# Patient Record
Sex: Male | Born: 1955
Health system: Southern US, Community
[De-identification: ages and names within clinical notes are randomized; demographics above are authoritative.]

## PROBLEM LIST (undated history)

## (undated) DIAGNOSIS — N400 Enlarged prostate without lower urinary tract symptoms: Secondary | ICD-10-CM

## (undated) DIAGNOSIS — R739 Hyperglycemia, unspecified: Secondary | ICD-10-CM

## (undated) DIAGNOSIS — I10 Essential (primary) hypertension: Secondary | ICD-10-CM

## (undated) DIAGNOSIS — N529 Male erectile dysfunction, unspecified: Secondary | ICD-10-CM

## (undated) DIAGNOSIS — E119 Type 2 diabetes mellitus without complications: Secondary | ICD-10-CM

## (undated) DIAGNOSIS — R7401 Elevation of levels of liver transaminase levels: Secondary | ICD-10-CM

## (undated) DIAGNOSIS — R740 Nonspecific elevation of levels of transaminase and lactic acid dehydrogenase [LDH]: Secondary | ICD-10-CM

## (undated) HISTORY — DX: Nonspecific elevation of levels of transaminase and lactic acid dehydrogenase (ldh): R74.0

## (undated) HISTORY — PX: COLONOSCOPY: SHX174

## (undated) HISTORY — DX: Essential (primary) hypertension: I10

## (undated) HISTORY — DX: Elevation of levels of liver transaminase levels: R74.01

## (undated) HISTORY — DX: Benign prostatic hyperplasia without lower urinary tract symptoms: N40.0

## (undated) HISTORY — DX: Hyperglycemia, unspecified: R73.9

## (undated) HISTORY — DX: Male erectile dysfunction, unspecified: N52.9

---

## 2001-07-13 HISTORY — PX: PROSTATE SURGERY: SHX751

## 2005-11-05 ENCOUNTER — Ambulatory Visit: Payer: Self-pay | Admitting: Family Medicine

## 2005-11-10 ENCOUNTER — Encounter: Payer: Self-pay | Admitting: Family Medicine

## 2005-11-10 HISTORY — PX: OTHER SURGICAL HISTORY: SHX169

## 2005-11-10 LAB — CONVERTED CEMR LAB

## 2005-12-15 ENCOUNTER — Ambulatory Visit: Payer: Self-pay | Admitting: Family Medicine

## 2005-12-29 ENCOUNTER — Ambulatory Visit: Payer: Self-pay | Admitting: Family Medicine

## 2006-03-01 ENCOUNTER — Ambulatory Visit: Payer: Self-pay | Admitting: Family Medicine

## 2006-03-31 ENCOUNTER — Ambulatory Visit: Payer: Self-pay | Admitting: Family Medicine

## 2006-04-20 ENCOUNTER — Ambulatory Visit: Payer: Self-pay | Admitting: Family Medicine

## 2006-07-19 ENCOUNTER — Ambulatory Visit: Payer: Self-pay | Admitting: Family Medicine

## 2006-07-19 LAB — CONVERTED CEMR LAB: Blood Glucose, Fasting: 197 mg/dL

## 2006-08-04 ENCOUNTER — Ambulatory Visit: Payer: Self-pay | Admitting: Family Medicine

## 2006-08-04 LAB — CONVERTED CEMR LAB
Cholesterol: 160 mg/dL (ref 0–200)
HDL: 35 mg/dL — ABNORMAL LOW (ref >39.0)
Hgb A1c MFr Bld: 5.7 %
Hgb A1c MFr Bld: 5.7 % (ref 4.6–6.0)
LDL Cholesterol: 86 mg/dL (ref 0–99)
Total CHOL/HDL Ratio: 4.6
Triglycerides: 197 mg/dL — ABNORMAL HIGH (ref 0–149)
VLDL: 39 mg/dL (ref 0–40)

## 2006-09-15 ENCOUNTER — Ambulatory Visit: Payer: Self-pay | Admitting: Family Medicine

## 2006-10-07 ENCOUNTER — Encounter: Payer: Self-pay | Admitting: Family Medicine

## 2006-10-07 DIAGNOSIS — F528 Other sexual dysfunction not due to a substance or known physiological condition: Secondary | ICD-10-CM | POA: Insufficient documentation

## 2006-10-07 DIAGNOSIS — R972 Elevated prostate specific antigen [PSA]: Secondary | ICD-10-CM | POA: Insufficient documentation

## 2006-10-07 DIAGNOSIS — N400 Enlarged prostate without lower urinary tract symptoms: Secondary | ICD-10-CM | POA: Insufficient documentation

## 2007-03-18 ENCOUNTER — Ambulatory Visit: Payer: Self-pay | Admitting: Family Medicine

## 2007-03-18 DIAGNOSIS — E786 Lipoprotein deficiency: Secondary | ICD-10-CM | POA: Insufficient documentation

## 2007-03-21 LAB — CONVERTED CEMR LAB
Albumin: 4.2 g/dL (ref 3.5–5.2)
BUN: 18 mg/dL (ref 6–23)
CO2: 28 meq/L (ref 19–32)
Calcium: 9.8 mg/dL (ref 8.4–10.5)
Chloride: 105 meq/L (ref 96–112)
Creatinine, Ser: 1 mg/dL (ref 0.4–1.5)
GFR calc Af Amer: 101 mL/min
GFR calc non Af Amer: 83 mL/min
Glucose, Bld: 109 mg/dL — ABNORMAL HIGH (ref 70–99)
Hgb A1c MFr Bld: 5.7 % (ref 4.6–6.0)
Phosphorus: 3.7 mg/dL (ref 2.3–4.6)
Potassium: 4.2 meq/L (ref 3.5–5.1)
Sodium: 140 meq/L (ref 135–145)

## 2007-09-16 ENCOUNTER — Ambulatory Visit: Payer: Self-pay | Admitting: Family Medicine

## 2008-03-27 ENCOUNTER — Ambulatory Visit: Payer: Self-pay | Admitting: Family Medicine

## 2008-04-02 LAB — CONVERTED CEMR LAB
ALT: 66 units/L — ABNORMAL HIGH (ref 0–53)
Albumin: 4.3 g/dL (ref 3.5–5.2)
BUN: 16 mg/dL (ref 6–23)
Basophils Absolute: 0 10*3/uL (ref 0.0–0.1)
Basophils Relative: 0.4 % (ref 0.0–3.0)
CO2: 30 meq/L (ref 19–32)
Calcium: 9.3 mg/dL (ref 8.4–10.5)
Cholesterol: 143 mg/dL (ref 0–200)
Creatinine, Ser: 1 mg/dL (ref 0.4–1.5)
Glucose, Bld: 108 mg/dL — ABNORMAL HIGH (ref 70–99)
Hemoglobin: 14.8 g/dL (ref 13.0–17.0)
Hgb A1c MFr Bld: 6.1 % — ABNORMAL HIGH (ref 4.6–6.0)
LDL Cholesterol: 85 mg/dL (ref 0–99)
Lymphocytes Relative: 33.2 % (ref 12.0–46.0)
MCHC: 34.2 g/dL (ref 30.0–36.0)
Neutro Abs: 2.5 10*3/uL (ref 1.4–7.7)
RBC: 4.74 M/uL (ref 4.22–5.81)
TSH: 1.53 microintl units/mL (ref 0.35–5.50)
Total Bilirubin: 2.1 mg/dL — ABNORMAL HIGH (ref 0.3–1.2)
Total Protein: 7.1 g/dL (ref 6.0–8.3)

## 2008-04-16 ENCOUNTER — Encounter: Payer: Self-pay | Admitting: Family Medicine

## 2008-04-16 ENCOUNTER — Ambulatory Visit: Payer: Self-pay | Admitting: Family Medicine

## 2008-04-16 DIAGNOSIS — R7401 Elevation of levels of liver transaminase levels: Secondary | ICD-10-CM | POA: Insufficient documentation

## 2008-04-16 DIAGNOSIS — E119 Type 2 diabetes mellitus without complications: Secondary | ICD-10-CM | POA: Insufficient documentation

## 2008-04-16 DIAGNOSIS — R740 Nonspecific elevation of levels of transaminase and lactic acid dehydrogenase [LDH]: Secondary | ICD-10-CM

## 2008-04-16 DIAGNOSIS — E1129 Type 2 diabetes mellitus with other diabetic kidney complication: Secondary | ICD-10-CM | POA: Insufficient documentation

## 2008-04-16 HISTORY — DX: Type 2 diabetes mellitus without complications: E11.9

## 2008-07-19 ENCOUNTER — Ambulatory Visit: Payer: Self-pay | Admitting: Family Medicine

## 2008-07-19 DIAGNOSIS — E785 Hyperlipidemia, unspecified: Secondary | ICD-10-CM

## 2008-07-19 DIAGNOSIS — E1169 Type 2 diabetes mellitus with other specified complication: Secondary | ICD-10-CM | POA: Insufficient documentation

## 2008-07-20 LAB — CONVERTED CEMR LAB
ALT: 28 units/L (ref 0–53)
CO2: 27 meq/L (ref 19–32)
Chloride: 106 meq/L (ref 96–112)
GFR calc Af Amer: 100 mL/min
GFR calc non Af Amer: 83 mL/min
Glucose, Bld: 106 mg/dL — ABNORMAL HIGH (ref 70–99)
Hgb A1c MFr Bld: 5.6 % (ref 4.6–6.0)
Potassium: 4 meq/L (ref 3.5–5.1)
Sodium: 140 meq/L (ref 135–145)

## 2008-07-24 ENCOUNTER — Ambulatory Visit: Payer: Self-pay | Admitting: Family Medicine

## 2008-09-24 ENCOUNTER — Telehealth: Payer: Self-pay | Admitting: Family Medicine

## 2009-01-24 ENCOUNTER — Ambulatory Visit: Payer: Self-pay | Admitting: Family Medicine

## 2009-01-25 LAB — CONVERTED CEMR LAB
ALT: 35 units/L (ref 0–53)
AST: 28 units/L (ref 0–37)
Cholesterol: 155 mg/dL (ref 0–200)
Hgb A1c MFr Bld: 6 % (ref 4.6–6.5)

## 2009-01-29 ENCOUNTER — Ambulatory Visit: Payer: Self-pay | Admitting: Family Medicine

## 2009-03-06 ENCOUNTER — Ambulatory Visit: Payer: Self-pay | Admitting: Family Medicine

## 2009-04-25 ENCOUNTER — Ambulatory Visit: Payer: Self-pay | Admitting: Family Medicine

## 2009-04-25 DIAGNOSIS — I10 Essential (primary) hypertension: Secondary | ICD-10-CM | POA: Insufficient documentation

## 2009-04-29 LAB — CONVERTED CEMR LAB
AST: 34 units/L (ref 0–37)
BUN: 21 mg/dL (ref 6–23)
CO2: 28 meq/L (ref 19–32)
Chloride: 103 meq/L (ref 96–112)
Cholesterol: 144 mg/dL (ref 0–200)
Creatinine, Ser: 0.9 mg/dL (ref 0.4–1.5)
GFR calc non Af Amer: 93.1 mL/min (ref >60)
Hgb A1c MFr Bld: 6 % (ref 4.6–6.5)
Phosphorus: 3.7 mg/dL (ref 2.3–4.6)
Sodium: 139 meq/L (ref 135–145)
Total CHOL/HDL Ratio: 5
Triglycerides: 95 mg/dL (ref 0.0–149.0)

## 2009-05-02 ENCOUNTER — Ambulatory Visit: Payer: Self-pay | Admitting: Family Medicine

## 2009-05-16 ENCOUNTER — Telehealth: Payer: Self-pay | Admitting: Family Medicine

## 2009-10-21 ENCOUNTER — Ambulatory Visit: Payer: Self-pay | Admitting: Family Medicine

## 2009-10-21 LAB — CONVERTED CEMR LAB
ALT: 53 units/L (ref 0–53)
AST: 30 units/L (ref 0–37)
Albumin: 4.2 g/dL (ref 3.5–5.2)
Chloride: 104 meq/L (ref 96–112)
Cholesterol: 148 mg/dL (ref 0–200)
GFR calc non Af Amer: 92.94 mL/min (ref >60)
Glucose, Bld: 114 mg/dL — ABNORMAL HIGH (ref 70–99)
Hgb A1c MFr Bld: 6.1 % (ref 4.6–6.5)
Phosphorus: 3.4 mg/dL (ref 2.3–4.6)
Potassium: 3.8 meq/L (ref 3.5–5.1)
Sodium: 141 meq/L (ref 135–145)
VLDL: 35.4 mg/dL (ref 0.0–40.0)

## 2009-10-24 ENCOUNTER — Ambulatory Visit: Payer: Self-pay | Admitting: Family Medicine

## 2010-06-10 ENCOUNTER — Encounter: Payer: Self-pay | Admitting: Family Medicine

## 2010-06-23 ENCOUNTER — Ambulatory Visit: Payer: Self-pay | Admitting: Family Medicine

## 2010-06-26 ENCOUNTER — Ambulatory Visit: Payer: Self-pay | Admitting: Family Medicine

## 2010-06-26 LAB — CONVERTED CEMR LAB
Albumin: 4 g/dL (ref 3.5–5.2)
Alkaline Phosphatase: 79 units/L (ref 39–117)
BUN: 17 mg/dL (ref 6–23)
Basophils Absolute: 0 10*3/uL (ref 0.0–0.1)
Basophils Relative: 0.2 % (ref 0.0–3.0)
Bilirubin, Direct: 0.2 mg/dL (ref 0.0–0.3)
CO2: 27 meq/L (ref 19–32)
Calcium: 9.4 mg/dL (ref 8.4–10.5)
Chloride: 104 meq/L (ref 96–112)
Cholesterol: 158 mg/dL (ref 0–200)
Creatinine, Ser: 0.9 mg/dL (ref 0.4–1.5)
Creatinine,U: 102.7 mg/dL
Eosinophils Absolute: 0.1 10*3/uL (ref 0.0–0.7)
Glucose, Bld: 125 mg/dL — ABNORMAL HIGH (ref 70–99)
HDL: 33.3 mg/dL — ABNORMAL LOW (ref >39.00)
Hgb A1c MFr Bld: 6.8 % — ABNORMAL HIGH (ref 4.6–6.5)
Lymphocytes Relative: 32 % (ref 12.0–46.0)
MCHC: 34.5 g/dL (ref 30.0–36.0)
MCV: 89.6 fL (ref 78.0–100.0)
Microalb, Ur: 1 mg/dL (ref 0.0–1.9)
Monocytes Absolute: 0.5 10*3/uL (ref 0.1–1.0)
Neutrophils Relative %: 57.3 % (ref 43.0–77.0)
RBC: 4.61 M/uL (ref 4.22–5.81)
RDW: 13.6 % (ref 11.5–14.6)
Total Protein: 6.7 g/dL (ref 6.0–8.3)
Triglycerides: 106 mg/dL (ref 0.0–149.0)

## 2010-07-18 ENCOUNTER — Encounter: Payer: Self-pay | Admitting: Family Medicine

## 2010-07-18 ENCOUNTER — Ambulatory Visit: Payer: Self-pay | Admitting: Family Medicine

## 2010-07-23 ENCOUNTER — Telehealth: Payer: Self-pay | Admitting: Family Medicine

## 2010-08-12 NOTE — Miscellaneous (Signed)
Summary: flu vaccine  Clinical Lists Changes  Observations: Added new observation of FLU VAX: Historical received at [facility] (04/18/2010 11:43)      Immunization History:  Influenza Immunization History:    Influenza:  historical received at [facility] (04/18/2010)

## 2010-08-12 NOTE — Assessment & Plan Note (Signed)
Summary: 3 MONTH FOLLOW UP/[facility]   Vital Signs:  Patient Profile:   55 Years Old Male Height:     69 inches (175.26 cm) Weight:      230 pounds BMI:     34.09 Temp:     97.7 degrees F oral Pulse rate:   72 / minute Pulse rhythm:   regular BP sitting:   120 / 84  (left arm) Cuff size:   large  Vitals Entered By: Liane Comber (July 24, 2008 7:57 AM)                 Chief Complaint:  3 mo f/u.  History of Present Illness: wt is down 20 lb today has watched his diet- staying away from sugar and juices is using treadmill for 1 hour per day -- over 3 mi per day   his goal is to loose to 180s   bp is great too  feels good/overall better   chol - LDL 83, HDL 33 AIC is 5.6-- improved from last time  other labs are ok   forgets his fish oil eats dark chocolate        Current Allergies (reviewed today): No known allergies   Past Medical History:    Reviewed history from 04/16/2008 and no changes required:       Hypertension       BPH       ED       hyperglycemia        elevated transaminases              urol Dr Reuel Boom   Past Surgical History:    Reviewed history from 10/07/2006 and no changes required:       US-  Abdominal , negaive (11/2005)       Prostate biopsy - neg. ( urologist (2003)   Family History:    father- prostate ca, DM     mother HTN , and high chol     brother DM - heart and kidney dz     uncle with prostate ca   Social History:    Reviewed history from 03/27/2008 and no changes required:       Patient is a former smoker.        walks 4 mi day for exercise       very rare alcoholic beverages     Review of Systems  General      Denies fatigue, loss of appetite, and malaise.  Eyes      Denies blurring and eye pain.  CV      Denies chest pain or discomfort, lightheadness, palpitations, and shortness of breath with exertion.  Resp      Denies cough and wheezing.  GI      Denies abdominal pain and change in bowel  habits.  Derm      Denies rash.  Neuro      Denies numbness and tingling.  Psych      mood is good   Endo      Denies excessive thirst and excessive urination.   Physical Exam  General:     overweight but generally well appearing- with sig wt loss since last visit   Head:     normocephalic, atraumatic, and no abnormalities observed.   Eyes:     vision grossly intact, pupils equal, pupils round, and pupils reactive to light.   Neck:     supple with full rom and no masses or thyromegally,  no JVD or carotid bruit  Lungs:     Normal respiratory effort, chest expands symmetrically. Lungs are clear to auscultation, no crackles or wheezes. Heart:     Normal rate and regular rhythm. S1 and S2 normal without gallop, murmur, click, rub or other extra sounds. Msk:     No deformity or scoliosis noted of thoracic or lumbar spine.   Extremities:     No clubbing, cyanosis, edema, or deformity noted with normal full range of motion of all joints.   foot exam not done today Neurologic:     sensation intact to light touch, gait normal, and DTRs symmetrical and normal.   Skin:     Intact without suspicious lesions or rashes Cervical Nodes:     No lymphadenopathy noted Psych:     normal affect, talkative and pleasant     Impression & Recommendations:  Problem # 1:  PURE HYPERCHOLESTEROLEMIA (ICD-272.0) Assessment: Unchanged overall very well controlled- but would like HDL higher adv to start taking fish oil / omega 3 regularly if not imp in future/ or worse- would consider niaspan labd 6 mo and f/u adv to keep up great diet and exercise Labs Reviewed: Chol: 136 (07/19/2008)   HDL: 33.1 (07/19/2008)   LDL: 83 (07/19/2008)   TG: 100 (07/19/2008) SGOT: 24 (07/19/2008)   SGPT: 28 (07/19/2008)   Problem # 2:  OBESITY, UNSPECIFIED (ICD-278.00) Assessment: Improved 20 lb wt loss commended urged to keep it up  aim for BMI of 27 as next goal  Problem # 3:  DIABETES MELLITUS,  TYPE II, MILD (ICD-250.00) Assessment: Improved AIC is now under 6 commended for good work with that  will continue working on wt loss labs 6 mo then f/u His updated medication list for this problem includes:    Benicar Hct 40-12.5 Mg Tabs (Olmesartan medoxomil-hctz) .Marland Kitchen... Take 1 tablet by mouth once a day    Adult Aspirin Ec Low Strength 81 Mg Tbec (Aspirin) .Marland Kitchen... As needed  Labs Reviewed: HgBA1c: 5.6 (07/19/2008)   Creat: 1.0 (07/19/2008)      Problem # 4:  HYPERTENSION (ICD-401.9) Assessment: Improved better control with lifestyle change/wt loss continue benicar labs and f/u in 6 mo His updated medication list for this problem includes:    Benicar Hct 40-12.5 Mg Tabs (Olmesartan medoxomil-hctz) .Marland Kitchen... Take 1 tablet by mouth once a day  BP today: 120/84 Prior BP: 160/88 (04/16/2008)  Labs Reviewed: Creat: 1.0 (07/19/2008) Chol: 136 (07/19/2008)   HDL: 33.1 (07/19/2008)   LDL: 83 (07/19/2008)   TG: 100 (07/19/2008)   Complete Medication List: 1)  Benicar Hct 40-12.5 Mg Tabs (Olmesartan medoxomil-hctz) .... Take 1 tablet by mouth once a day 2)  Avodart 0.5 Mg Caps (Dutasteride) .Marland Kitchen.. 1 cap weekly 3)  Adult Aspirin Ec Low Strength 81 Mg Tbec (Aspirin) .... As needed   Patient Instructions: 1)  no change in medication 2)  keep up the good work with diet and exercise  3)  try to take fish oil regularly up to three 1000 mg capsules daily  4)  schedule fasting labs in 6 months- lipid/ast/alt/AIC 250.0, 272 and then follow up

## 2010-08-12 NOTE — Assessment & Plan Note (Signed)
Summary: 6 MTH FUR/BH  R/S FROM 10/28/09   Vital Signs:  Patient profile:   55 year old male Height:      69 inches Weight:      248.25 pounds BMI:     36.79 Temp:     98 degrees F oral Pulse rate:   76 / minute Pulse rhythm:   regular BP sitting:   128 / 84  (left arm) Cuff size:   large  Vitals Entered By: Lewanda Rife LPN (October 24, 2009 8:23 AM) CC: six month f/u after labs   History of Present Illness: here for f/u of DM and HTN and lipids  has been feeling good    lipids are imp with HDL up to 38 and LDL 75 diet is overall pretty good - but eats too late at night  eats the right things -- salads for lunch  no fast food    AIC is up to 6.1 from 6-- overall stable opthy --was due in december - got too busy -- needs to schedule that  some sweets occas   bp 128/84- on hyzaar   wt is down 1 lb - really wants to loose more really busy at work -- is new so traveling a lot -- is settling down  no time to exercise thinks he needs to get up earlier to exercise   was walking 2 mi per day for a while and felt good with that  has an elliptical machine   Allergies (verified): No Known Drug Allergies  Past History:  Past Medical History: Last updated: 04/16/2008 Hypertension BPH ED hyperglycemia  elevated transaminases  urol Dr Reuel Boom   Past Surgical History: Last updated: 10/07/2006 US-  Abdominal , negaive (11/2005) Prostate biopsy - neg. ( urologist (2003)  Family History: Last updated: 01/29/2009 father- prostate ca, DM , died of stroke (hemorrhagic)  mother HTN , and high chol  brother DM - heart and kidney dz  uncle with prostate ca   Social History: Last updated: 03/27/2008 Patient is a former smoker.  walks 4 mi day for exercise very rare alcoholic beverages   Risk Factors: Smoking Status: quit (10/07/2006)  Review of Systems General:  Denies fatigue, fever, loss of appetite, and malaise. Eyes:  Denies blurring and eye irritation. CV:   Denies chest pain or discomfort, lightheadness, palpitations, and shortness of breath with exertion. Resp:  Denies cough and wheezing. GI:  Denies abdominal pain, bloody stools, change in bowel habits, indigestion, and nausea. GU:  Denies urinary frequency. MS:  Denies muscle aches. Derm:  Denies itching, lesion(s), poor wound healing, and rash. Neuro:  Denies headaches, numbness, and tingling. Psych:  mood is ok . Endo:  Denies cold intolerance, excessive thirst, excessive urination, and heat intolerance. Heme:  Denies abnormal bruising and bleeding.  Physical Exam  General:  overweight but generally well appearing  Head:  normocephalic, atraumatic, and no abnormalities observed.   Eyes:  vision grossly intact, pupils equal, pupils round, and pupils reactive to light.  no conjunctival pallor, injection or icterus  Mouth:  pharynx pink and moist.   Neck:  No deformities, masses, or tenderness noted. Lungs:  Normal respiratory effort, chest expands symmetrically. Lungs are clear to auscultation, no crackles or wheezes. Heart:  Normal rate and regular rhythm. S1 and S2 normal without gallop, murmur, click, rub or other extra sounds. Abdomen:  Bowel sounds positive,abdomen soft and non-tender without masses, organomegaly or hernias noted. no renal bruits  Msk:  No deformity or  scoliosis noted of thoracic or lumbar spine.  no acute joint changes  Pulses:  R and L carotid,radial,femoral,dorsalis pedis and posterior tibial pulses are full and equal bilaterally Extremities:  No clubbing, cyanosis, edema, or deformity noted with normal full range of motion of all joints.   Neurologic:  sensation intact to light touch, gait normal, and DTRs symmetrical and normal.   Skin:  Intact without suspicious lesions or rashes Cervical Nodes:  No lymphadenopathy noted Inguinal Nodes:  No significant adenopathy Psych:  normal affect, talkative and pleasant   Diabetes Management Exam:    Foot Exam (with  socks and/or shoes not present):       Sensory-Pinprick/Light touch:          Left medial foot (L-4): normal          Left dorsal foot (L-5): normal          Left lateral foot (S-1): normal          Right medial foot (L-4): normal          Right dorsal foot (L-5): normal          Right lateral foot (S-1): normal       Sensory-Monofilament:          Left foot: normal          Right foot: normal       Inspection:          Left foot: normal          Right foot: normal       Nails:          Left foot: normal          Right foot: normal   Impression & Recommendations:  Problem # 1:  ESSENTIAL HYPERTENSION, BENIGN (ICD-401.1) Assessment Improved  bp is stable on hyzaar and to goal disc goal of exercise 5 days per week for 30 min- elliptical and walking  also wt loss rev labs sched fasting lab in 6 mo then check up His updated medication list for this problem includes:    Hyzaar 100-25 Mg Tabs (Losartan potassium-hctz) .Marland Kitchen... 1 by mouth once daily  BP today: 128/84-- re check 128/80 Prior BP: 140/86 (05/02/2009)  Labs Reviewed: K+: 3.8 (10/21/2009) Creat: : 0.9 (10/21/2009)   Chol: 148 (10/21/2009)   HDL: 38.00 (10/21/2009)   LDL: 75 (10/21/2009)   TG: 177.0 (10/21/2009)  Orders: Prescription Created Electronically [id number])  Problem # 2:  DIABETES MELLITUS, TYPE II, MILD (ICD-250.00) Assessment: Unchanged  overall very well controlled disc healthy diet (low simple sugar/ choose complex carbs/ low sat fat) diet and exercise in detail  goal of 5-10 lb wt loss in 6 mo  will continue low sugar diet and cut portions by 1/3  lab and f/u 6 mo  enc to sched opthy appt- pt will do that himself- disc imp of this  His updated medication list for this problem includes:    Adult Aspirin Ec Low Strength 81 Mg Tbec (Aspirin) .Marland Kitchen... As needed    Hyzaar 100-25 Mg Tabs (Losartan potassium-hctz) .Marland Kitchen... 1 by mouth once daily  Labs Reviewed: Creat: 0.9 (10/21/2009)     Last Eye Exam: normal  (06/12/2008) Reviewed HgBA1c results: 6.1 (10/21/2009)  6.0 (04/25/2009)  Orders: Prescription Created Electronically [phone])  Problem # 3:  PURE HYPERCHOLESTEROLEMIA (ICD-272.0) Assessment: Unchanged  rev labs with pt - goal for HDL and LDL - almost reached rev low sat fat diet and imp of exercise liver tests ok  lab and f/u planned 6 mo   Labs Reviewed: SGOT: 30 (10/21/2009)   SGPT: 53 (10/21/2009)   HDL:38.00 (10/21/2009), 31.50 (04/25/2009)  LDL:75 (10/21/2009), 94 (04/25/2009)  Chol:148 (10/21/2009), 144 (04/25/2009)  Trig:177.0 (10/21/2009), 95.0 (04/25/2009)  Orders: Prescription Created Electronically [phone])  Complete Medication List: 1)  Avodart 0.5 Mg Caps (Dutasteride) .Marland Kitchen.. 1 cap weekly 2)  Adult Aspirin Ec Low Strength 81 Mg Tbec (Aspirin) .... As needed 3)  Hyzaar 100-25 Mg Tabs (Losartan potassium-hctz) .Marland Kitchen.. 1 by mouth once daily  Patient Instructions: 1)  do not forget to schedule yearly eye exam  2)  no change in medicines  3)  cut your food portions by about 1/3 for wt loss 4)  try to exercise 30 min at least 5 d per week- perhaps in the am  5)  continue low sugar and low fat diet  6)  schedule fasting labs and then check up in about 6 months  7)  lipid/wellness/ AIC / microalb 250.0, v70.0, 272  Prescriptions: HYZAAR 100-25 MG TABS (LOSARTAN POTASSIUM-HCTZ) 1 by mouth once daily  #30 x 11   Entered and Authorized by:   Judith Part MD   Signed by:   Judith Part MD on 10/24/2009   Method used:   Electronically to        [facility]. [address] [phone]* (retail)       [address] [place]  [address]       Ph: [phone]       Fax: [phone]   RxID:   [id number]   Current Allergies (reviewed today): No known allergies

## 2010-08-13 ENCOUNTER — Ambulatory Visit: Payer: Self-pay | Admitting: Family Medicine

## 2010-08-14 NOTE — Progress Notes (Signed)
Summary: test strips&lancets       New/Updated Medications: ONETOUCH ULTRA BLUE  STRP (GLUCOSE BLOOD) Check blood sugar twice daily Prescriptions: ONETOUCH DELICA LANCETS  MISC (LANCETS) Check blood sugar twice daily  #100 x prn   Entered by:   Melody Comas   Authorized by:   Judith Part MD   Signed by:   Melody Comas on 07/23/2010   Method used:   Faxed to ...       [facility] (retail)       [address]       [place], [place]    [place]       Ph: [phone]       Fax: [phone]   RxID:   [id number] [facility] ULTRA BLUE  STRP (GLUCOSE BLOOD) Check blood sugar twice daily  #100 x prn   Entered by:   Melody Comas   Authorized by:   Judith Part MD   Signed by:   Melody Comas on 07/23/2010   Method used:   Faxed to ...       [facility] (retail)       [address]       [place], [place]    [place]       Ph: [phone]       Fax: [phone]   RxID:   [id number]   Prior Medications: AVODART 0.5 MG  CAPS (DUTASTERIDE) 1 cap weekly ADULT ASPIRIN EC LOW STRENGTH 81 MG  TBEC (ASPIRIN) as needed HYZAAR 100-25 MG TABS (LOSARTAN POTASSIUM-HCTZ) 1 by mouth once daily ONETOUCH ULTRA BLUE  STRP (GLUCOSE BLOOD) Check blood sugar twice daily ONETOUCH DELICA LANCETS  MISC (LANCETS) Check blood sugar twice daily Current Allergies: No known allergies

## 2010-08-14 NOTE — Miscellaneous (Signed)
[facility]   Imported By: Lester Chester 07/04/2010 10:09:21  _____________________________________________________________________  External Attachment:    Type:   Image     Comment:   External Document

## 2010-08-14 NOTE — Consult Note (Signed)
Summary: [facility]  [facility]   Imported By: Lanelle Bal 07/28/2010 14:17:12  _____________________________________________________________________  External Attachment:    Type:   Image     Comment:   External Document

## 2010-08-14 NOTE — Assessment & Plan Note (Signed)
Summary: 6 MONTH FOLLOW UP/[facility]   Vital Signs:  Patient profile:   55 year old male Height:      69 inches Weight:      253.75 pounds BMI:     37.61 Temp:     98.4 degrees F oral Pulse rate:   84 / minute Pulse rhythm:   regular BP sitting:   118 / 76  (left arm) Cuff size:   large  Vitals Entered By: Lewanda Rife LPN (June 26, 2010 8:40 AM) CC: six month f/u after labs   History of Present Illness: herre for f/u of HTN and lipids and hyperglycemia  and elevated liver tests   has been feeling good   wt is up 5 lb with bmi of 37 since sept has been working very long hours  binges at night because he is starving  too much fast food  needs to pack meals for work   really loves his work   is working 65 hours per week  not exercising   HTN is in good control with 118/76 today   lipids are up with HDL 33 and LDL 104 (from 75)  hyperglycemia worse- now [facility] is 6.8 was 6.1  LFTs up a bit with ast 38 andALT of 64 poss fatty liver   Allergies (verified): No Known Drug Allergies  Past History:  Past Surgical History: Last updated: 10/07/2006 US-  Abdominal , negaive (11/2005) Prostate biopsy - neg. ( urologist (2003)  Family History: Last updated: 01/29/2009 father- prostate ca, DM , died of stroke (hemorrhagic)  mother HTN , and high chol  brother DM - heart and kidney dz  uncle with prostate ca   Social History: Last updated: 03/27/2008 Patient is a former smoker.  walks 4 mi day for exercise very rare alcoholic beverages   Risk Factors: Smoking Status: quit (10/07/2006)  Past Medical History: Hypertension BPH ED hyperglycemia  elevated transaminases- suspect fatty liver  urol Dr Reuel Boom   Review of Systems General:  Complains of fatigue; denies fever, loss of appetite, and malaise. Eyes:  Denies blurring and eye irritation. CV:  Denies chest pain or discomfort, palpitations, and shortness of breath with exertion. Resp:  Denies cough, shortness  of breath, and wheezing. GI:  Denies abdominal pain, bloody stools, change in bowel habits, indigestion, and nausea. GU:  Denies hematuria and urinary frequency. MS:  Denies joint pain, joint redness, and joint swelling. Derm:  Denies itching, lesion(s), poor wound healing, and rash. Neuro:  Denies headaches, numbness, and tingling. Endo:  Denies cold intolerance, excessive thirst, excessive urination, and heat intolerance.  Physical Exam  General:  overweight but generally well appearing  Head:  normocephalic, atraumatic, and no abnormalities observed.   Eyes:  vision grossly intact, pupils equal, pupils round, and pupils reactive to light.  no conjunctival pallor, injection or icterus  Mouth:  pharynx pink and moist.   Neck:  No deformities, masses, or tenderness noted. Chest Wall:  No deformities, masses, tenderness or gynecomastia noted. Lungs:  Normal respiratory effort, chest expands symmetrically. Lungs are clear to auscultation, no crackles or wheezes. Heart:  Normal rate and regular rhythm. S1 and S2 normal without gallop, murmur, click, rub or other extra sounds. Abdomen:  Bowel sounds positive,abdomen soft and non-tender without masses, organomegaly or hernias noted. no renal bruits  Msk:  No deformity or scoliosis noted of thoracic or lumbar spine.  no acute joint changes  Pulses:  R and L carotid,radial,femoral,dorsalis pedis and posterior tibial pulses are  full and equal bilaterally Extremities:  No clubbing, cyanosis, edema, or deformity noted with normal full range of motion of all joints.   Neurologic:  sensation intact to light touch, gait normal, and DTRs symmetrical and normal.   Skin:  Intact without suspicious lesions or rashes Cervical Nodes:  No lymphadenopathy noted Inguinal Nodes:  No significant adenopathy Psych:  normal affect, talkative and pleasant   Diabetes Management Exam:    Foot Exam (with socks and/or shoes not present):       Sensory-Pinprick/Light  touch:          Left medial foot (L-4): normal          Left dorsal foot (L-5): normal          Left lateral foot (S-1): normal          Right medial foot (L-4): normal          Right dorsal foot (L-5): normal          Right lateral foot (S-1): normal       Sensory-Monofilament:          Left foot: normal          Right foot: normal       Inspection:          Left foot: normal          Right foot: normal       Nails:          Left foot: normal          Right foot: normal   Impression & Recommendations:  Problem # 1:  DIABETES MELLITUS, TYPE II, MILD (ICD-250.00) Assessment Deteriorated  AIC is up to 6.8 ref DM teaching  disc inc exercise no meds at this time rev low glycemic diet and need for wt loss  f/u 6 mo His updated medication list for this problem includes:    Adult Aspirin Ec Low Strength 81 Mg Tbec (Aspirin) .Marland Kitchen... As needed    Hyzaar 100-25 Mg Tabs (Losartan potassium-hctz) .Marland Kitchen... 1 by mouth once daily  Orders: Diabetic Clinic Referral (Diabetic)  Labs Reviewed: Creat: 0.9 (10/21/2009)     Last Eye Exam: normal (06/12/2008) Reviewed HgBA1c results: 6.1 (10/21/2009)  6.0 (04/25/2009)  Problem # 2:  ESSENTIAL HYPERTENSION, BENIGN (ICD-401.1) Assessment: Unchanged  this is well controlled  meds renewed  labs reviewed  His updated medication list for this problem includes:    Hyzaar 100-25 Mg Tabs (Losartan potassium-hctz) .Marland Kitchen... 1 by mouth once daily  BP today: 118/76 Prior BP: 128/84 (10/24/2009)  Labs Reviewed: K+: 3.8 (10/21/2009) Creat: : 0.9 (10/21/2009)   Chol: 148 (10/21/2009)   HDL: 38.00 (10/21/2009)   LDL: 75 (10/21/2009)   TG: 177.0 (10/21/2009)  Problem # 3:  PURE HYPERCHOLESTEROLEMIA (ICD-272.0) Assessment: Deteriorated  this is fairl but LDL up with fast food and wt gain disc low sat fat diet  re check 6 mo  Labs Reviewed: SGOT: 30 (10/21/2009)   SGPT: 53 (10/21/2009)   HDL:38.00 (10/21/2009), 31.50 (04/25/2009)  LDL:75  (10/21/2009), 94 (16/04/9603)  Chol:148 (10/21/2009), 144 (04/25/2009)  Trig:177.0 (10/21/2009), 95.0 (04/25/2009)  Problem # 4:  TRANSAMINASES, SERUM, ELEVATED (ICD-790.4) Assessment: Deteriorated up a bit with wt gain  suspect fatty liver disc wt loss strategy  Complete Medication List: 1)  Avodart 0.5 Mg Caps (Dutasteride) .Marland Kitchen.. 1 cap weekly 2)  Adult Aspirin Ec Low Strength 81 Mg Tbec (Aspirin) .... As needed 3)  Hyzaar 100-25 Mg Tabs (Losartan potassium-hctz) .Marland Kitchen.. 1 by  mouth once daily  Patient Instructions: 1)  I think with better habits and weight loss we can improve the diabetes and cholesterol  2)  blood pressure is great  3)  we will refer you for diabetic teaching at out  4)  schedule fasting lab and then follow up in 6 months lipid/hepatic/ renal/ AIC for 272, 401.1 and hyperglycemia  Prescriptions: HYZAAR 100-25 MG TABS (LOSARTAN POTASSIUM-HCTZ) 1 by mouth once daily  #30 x 11   Entered and Authorized by:   Judith Part MD   Signed by:   Judith Part MD on 06/26/2010   Method used:   Print then Give to Patient   RxID:   [id number]    Orders Added: 1)  Diabetic Clinic Referral [Diabetic] 2)  Est. Patient Level IV [65784]    Current Allergies (reviewed today): No known allergies

## 2010-09-11 ENCOUNTER — Ambulatory Visit: Payer: Self-pay | Admitting: Family Medicine

## 2010-10-12 ENCOUNTER — Ambulatory Visit: Payer: Self-pay | Admitting: Family Medicine

## 2010-12-19 ENCOUNTER — Other Ambulatory Visit (INDEPENDENT_AMBULATORY_CARE_PROVIDER_SITE_OTHER): Payer: BC Managed Care – PPO | Admitting: Family Medicine

## 2010-12-19 DIAGNOSIS — R7309 Other abnormal glucose: Secondary | ICD-10-CM

## 2010-12-19 DIAGNOSIS — E7800 Pure hypercholesterolemia, unspecified: Secondary | ICD-10-CM

## 2010-12-19 DIAGNOSIS — I10 Essential (primary) hypertension: Secondary | ICD-10-CM

## 2010-12-19 DIAGNOSIS — R739 Hyperglycemia, unspecified: Secondary | ICD-10-CM

## 2010-12-19 LAB — HEPATIC FUNCTION PANEL
ALT: 25 U/L (ref 0–53)
AST: 20 U/L (ref 0–37)
Albumin: 4.3 g/dL (ref 3.5–5.2)
Alkaline Phosphatase: 70 U/L (ref 39–117)

## 2010-12-19 LAB — RENAL FUNCTION PANEL
BUN: 19 mg/dL (ref 6–23)
Calcium: 9.3 mg/dL (ref 8.4–10.5)
Creatinine, Ser: 0.8 mg/dL (ref 0.4–1.5)
GFR: 112.49 mL/min (ref >60.00)
Glucose, Bld: 114 mg/dL — ABNORMAL HIGH (ref 70–99)
Sodium: 140 mEq/L (ref 135–145)

## 2010-12-19 LAB — LIPID PANEL
HDL: 35.6 mg/dL — ABNORMAL LOW (ref >39.00)
Triglycerides: 134 mg/dL (ref 0.0–149.0)

## 2010-12-19 LAB — HEMOGLOBIN A1C: Hgb A1c MFr Bld: 6.2 % (ref 4.6–6.5)

## 2010-12-20 ENCOUNTER — Encounter: Payer: Self-pay | Admitting: Family Medicine

## 2010-12-24 ENCOUNTER — Encounter: Payer: Self-pay | Admitting: Family Medicine

## 2010-12-24 ENCOUNTER — Ambulatory Visit (INDEPENDENT_AMBULATORY_CARE_PROVIDER_SITE_OTHER): Payer: BC Managed Care – PPO | Admitting: Family Medicine

## 2010-12-24 DIAGNOSIS — R972 Elevated prostate specific antigen [PSA]: Secondary | ICD-10-CM

## 2010-12-24 DIAGNOSIS — R7401 Elevation of levels of liver transaminase levels: Secondary | ICD-10-CM

## 2010-12-24 DIAGNOSIS — I10 Essential (primary) hypertension: Secondary | ICD-10-CM

## 2010-12-24 DIAGNOSIS — R7402 Elevation of levels of lactic acid dehydrogenase (LDH): Secondary | ICD-10-CM

## 2010-12-24 DIAGNOSIS — R7309 Other abnormal glucose: Secondary | ICD-10-CM

## 2010-12-24 DIAGNOSIS — E7800 Pure hypercholesterolemia, unspecified: Secondary | ICD-10-CM

## 2010-12-24 DIAGNOSIS — R739 Hyperglycemia, unspecified: Secondary | ICD-10-CM

## 2010-12-24 NOTE — Assessment & Plan Note (Signed)
Improved  Continue good habits and wt loss Did well with DM teaching Ok to check sugar once daily

## 2010-12-24 NOTE — Assessment & Plan Note (Signed)
Improved with wt loss Enc to keep loosing  Bili is high- suspect Gilberts syndrome -- will watch it and for symptoms

## 2010-12-24 NOTE — Progress Notes (Signed)
Subjective:    Patient ID: Daniel Lee, male    DOB: 1955/11/29, 55 y.o.   MRN: [id number]  HPI Pt is here for f/u of lipids/ hyperglycemia , HTN and elevated liver enzymes Is doing well   DM is now hyperglycemia with A1c of 6.2 Improved Sugars -- in ams one teens -- lately 118-131 ( with bedtime snack) ,  Pp usually runs 120s - if he eats the right thing  Wt is down 10 lb! At home weighed 238 -- proud of this -- has lost at least 10 lb  Was ref to DM teaching -- it was very informative and helpful - urged him to check sugar bid but checks himself more often  Really helps him gage how he is doing  Eats a lot of grilled chicken  Much better with diet   Is exercising at least 4 d per week -- but looses motivation frequently  HTN in good control 124/88 No ha or cp or palpitations On  Hyzaar== takes it at night   lfts imp with ast alt in nl range Bili is 1.7- that is up  No abd pain or other symptoms   Sees urol twice yearly Watching psa  No problems   Patient Active Problem List  Diagnoses  . Hyperglycemia  . PURE HYPERCHOLESTEROLEMIA  . LOW HDL  . OBESITY, UNSPECIFIED  . ERECTILE DYSFUNCTION  . ESSENTIAL HYPERTENSION, BENIGN  . TRANSAMINASES, SERUM, ELEVATED  . PROSTATE SPECIFIC ANTIGEN, ELEVATED  . BENIGN PROSTATIC HYPERTROPHY, HX OF   Past Medical History  Diagnosis Date  . Hypertension   . BPH (benign prostatic hypertrophy)   . ED (erectile dysfunction)   . Hyperglycemia   . Elevated alanine aminotransferase (ALT) level     suspect fatty liver   Past Surgical History  Procedure Date  . Prostate surgery 2003    prostate biopsy negative // urologist  . Abd ultrasound 11/2005    negative   History  Substance Use Topics  . Smoking status: Never Smoker   . Smokeless tobacco: Not on file  . Alcohol Use: Yes     rarely   Family History  Problem Relation Age of Onset  . Hypertension Mother   . Hyperlipidemia Mother   . Cancer Father     prostate  cancer   . Diabetes Father   . Stroke Father   . Heart disease Brother   . Diabetes Brother   . Kidney disease Brother   . Cancer Paternal Uncle     prostate cancer   No Known Allergies Current Outpatient Prescriptions on File Prior to Visit  Medication Sig Dispense Refill  . dutasteride (AVODART) 0.5 MG capsule One capsule weekly       . glucose blood (ONE TOUCH ULTRA TEST) test strip Check blood sugar twice daily       . losartan-hydrochlorothiazide (HYZAAR) 100-25 MG per tablet Take 1 tablet by mouth daily.        Letta Pate DELICA LANCETS MISC Check blood sugar twice a day       . aspirin 81 MG EC tablet As needed           Review of Systems Review of Systems  Constitutional: Negative for fever, appetite change, fatigue and unexpected weight change.  Eyes: Negative for pain and visual disturbance.  Respiratory: Negative for cough and shortness of breath.   Cardiovascular: Negative. For cp or palpitations  Gastrointestinal: Negative for nausea, diarrhea and constipation.  Genitourinary: Negative for urgency  and frequency.  Skin: Negative for pallor. or rash Neurological: Negative for weakness, light-headedness, numbness and headaches.  Hematological: Negative for adenopathy. Does not bruise/bleed easily.  Psychiatric/Behavioral: Negative for dysphoric mood. The patient is not nervous/anxious.          Objective:   Physical Exam  Constitutional: He appears well-developed and well-nourished. No distress.  HENT:  Head: Normocephalic and atraumatic.  Mouth/Throat: Oropharynx is clear and moist.  Eyes: Conjunctivae and EOM are normal. Pupils are equal, round, and reactive to light.  Neck: Normal range of motion. Neck supple. No JVD present. Carotid bruit is not present. No thyromegaly present.  Cardiovascular: Normal rate, regular rhythm, normal heart sounds and intact distal pulses.   Pulmonary/Chest: Effort normal and breath sounds normal. No respiratory distress. He has  no wheezes.  Abdominal: Soft. Bowel sounds are normal. He exhibits no distension, no abdominal bruit and no mass. There is no tenderness.  Musculoskeletal: Normal range of motion. He exhibits no edema and no tenderness.  Lymphadenopathy:    He has no cervical adenopathy.  Neurological: He is alert. He has normal reflexes. Coordination normal.  Skin: Skin is warm and dry. No rash noted. No erythema. No pallor.  Psychiatric: He has a normal mood and affect.          Assessment & Plan:

## 2010-12-24 NOTE — Patient Instructions (Signed)
Keep up the good work with diet and exercise and wt loss  Sugar / cholesterol/ liver function tests all improved Check sugar once daily - some am , some pm  Schedule PE with fasting labs prior in 6 months

## 2010-12-24 NOTE — Assessment & Plan Note (Signed)
Pt sees urol twice yearly- per him stable

## 2010-12-24 NOTE — Assessment & Plan Note (Signed)
Improved with lifestyle change Urged to keep it up  Rev labs Rev low sat fat diet

## 2010-12-24 NOTE — Assessment & Plan Note (Signed)
Good control No change in hyzaar Urged to keep up good habits and wt loss

## 2011-06-22 ENCOUNTER — Telehealth: Payer: Self-pay | Admitting: Family Medicine

## 2011-06-22 DIAGNOSIS — R972 Elevated prostate specific antigen [PSA]: Secondary | ICD-10-CM

## 2011-06-22 DIAGNOSIS — E7800 Pure hypercholesterolemia, unspecified: Secondary | ICD-10-CM

## 2011-06-22 DIAGNOSIS — R739 Hyperglycemia, unspecified: Secondary | ICD-10-CM

## 2011-06-22 DIAGNOSIS — Z0000 Encounter for general adult medical examination without abnormal findings: Secondary | ICD-10-CM | POA: Insufficient documentation

## 2011-06-22 DIAGNOSIS — I10 Essential (primary) hypertension: Secondary | ICD-10-CM

## 2011-06-22 DIAGNOSIS — R7401 Elevation of levels of liver transaminase levels: Secondary | ICD-10-CM

## 2011-06-22 DIAGNOSIS — R7402 Elevation of levels of lactic acid dehydrogenase (LDH): Secondary | ICD-10-CM

## 2011-06-22 DIAGNOSIS — E786 Lipoprotein deficiency: Secondary | ICD-10-CM

## 2011-06-22 DIAGNOSIS — Z87898 Personal history of other specified conditions: Secondary | ICD-10-CM

## 2011-06-22 NOTE — Telephone Encounter (Signed)
Message copied by Judy Pimple on Mon Jun 22, 2011  8:21 AM ------      Message from: Alvina Chou      Created: Tue Jun 16, 2011 11:52 AM      Regarding: orders for 06-23-11       Patient is scheduled for CPX labs, please order future labs, Thanks , Camelia Eng

## 2011-06-23 ENCOUNTER — Other Ambulatory Visit (INDEPENDENT_AMBULATORY_CARE_PROVIDER_SITE_OTHER): Payer: BC Managed Care – PPO

## 2011-06-23 DIAGNOSIS — R739 Hyperglycemia, unspecified: Secondary | ICD-10-CM

## 2011-06-23 DIAGNOSIS — I10 Essential (primary) hypertension: Secondary | ICD-10-CM

## 2011-06-23 DIAGNOSIS — Z87898 Personal history of other specified conditions: Secondary | ICD-10-CM

## 2011-06-23 DIAGNOSIS — R7401 Elevation of levels of liver transaminase levels: Secondary | ICD-10-CM

## 2011-06-23 DIAGNOSIS — Z0000 Encounter for general adult medical examination without abnormal findings: Secondary | ICD-10-CM

## 2011-06-23 DIAGNOSIS — R972 Elevated prostate specific antigen [PSA]: Secondary | ICD-10-CM

## 2011-06-23 DIAGNOSIS — R7309 Other abnormal glucose: Secondary | ICD-10-CM

## 2011-06-23 DIAGNOSIS — E7800 Pure hypercholesterolemia, unspecified: Secondary | ICD-10-CM

## 2011-06-23 DIAGNOSIS — R7402 Elevation of levels of lactic acid dehydrogenase (LDH): Secondary | ICD-10-CM

## 2011-06-23 DIAGNOSIS — E786 Lipoprotein deficiency: Secondary | ICD-10-CM

## 2011-06-23 LAB — LIPID PANEL
Cholesterol: 171 mg/dL (ref 0–200)
HDL: 37.2 mg/dL — ABNORMAL LOW (ref >39.00)
Triglycerides: 210 mg/dL — ABNORMAL HIGH (ref 0.0–149.0)
VLDL: 42 mg/dL — ABNORMAL HIGH (ref 0.0–40.0)

## 2011-06-23 LAB — CBC WITH DIFFERENTIAL/PLATELET
Basophils Relative: 0.5 % (ref 0.0–3.0)
Eosinophils Absolute: 0 10*3/uL (ref 0.0–0.7)
Eosinophils Relative: 0 % (ref 0.0–5.0)
HCT: 44.2 % (ref 39.0–52.0)
Lymphs Abs: 1.9 10*3/uL (ref 0.7–4.0)
MCHC: 34.2 g/dL (ref 30.0–36.0)
MCV: 89.6 fl (ref 78.0–100.0)
Monocytes Absolute: 0.7 10*3/uL (ref 0.1–1.0)
Neutro Abs: 3.8 10*3/uL (ref 1.4–7.7)
RBC: 4.94 Mil/uL (ref 4.22–5.81)
WBC: 6.4 10*3/uL (ref 4.5–10.5)

## 2011-06-23 LAB — LDL CHOLESTEROL, DIRECT: Direct LDL: 99.9 mg/dL

## 2011-06-23 LAB — HEMOGLOBIN A1C: Hgb A1c MFr Bld: 6.4 % (ref 4.6–6.5)

## 2011-06-23 LAB — TSH: TSH: 3.27 u[IU]/mL (ref 0.35–5.50)

## 2011-06-23 LAB — COMPREHENSIVE METABOLIC PANEL
Albumin: 4.1 g/dL (ref 3.5–5.2)
Alkaline Phosphatase: 93 U/L (ref 39–117)
BUN: 17 mg/dL (ref 6–23)
Creatinine, Ser: 0.9 mg/dL (ref 0.4–1.5)
Glucose, Bld: 128 mg/dL — ABNORMAL HIGH (ref 70–99)
Total Bilirubin: 1.2 mg/dL (ref 0.3–1.2)

## 2011-06-30 ENCOUNTER — Encounter: Payer: Self-pay | Admitting: Family Medicine

## 2011-06-30 ENCOUNTER — Ambulatory Visit (INDEPENDENT_AMBULATORY_CARE_PROVIDER_SITE_OTHER): Payer: BC Managed Care – PPO | Admitting: Family Medicine

## 2011-06-30 VITALS — BP 136/84 | HR 68 | Temp 98.1°F | Ht 68.75 in | Wt 248.5 lb

## 2011-06-30 DIAGNOSIS — I10 Essential (primary) hypertension: Secondary | ICD-10-CM

## 2011-06-30 DIAGNOSIS — E7800 Pure hypercholesterolemia, unspecified: Secondary | ICD-10-CM

## 2011-06-30 DIAGNOSIS — R7309 Other abnormal glucose: Secondary | ICD-10-CM

## 2011-06-30 DIAGNOSIS — R739 Hyperglycemia, unspecified: Secondary | ICD-10-CM

## 2011-06-30 DIAGNOSIS — Z0000 Encounter for general adult medical examination without abnormal findings: Secondary | ICD-10-CM

## 2011-06-30 MED ORDER — LOSARTAN POTASSIUM-HCTZ 100-25 MG PO TABS: 1.0000 | ORAL_TABLET | Freq: Every day | ORAL | Status: DC

## 2011-06-30 NOTE — Assessment & Plan Note (Signed)
bp in fair control at this time  No changes needed  Disc lifstyle change with low sodium diet and exercise   meds refil- pharmacy and then print for mail order

## 2011-06-30 NOTE — Assessment & Plan Note (Signed)
Reviewed health habits including diet and exercise and skin cancer prevention Also reviewed health mt list, fam hx and immunizations   Rev wellness labs Sees urol for elevated psa and bph and prostate screening  He will make own colonosc appt

## 2011-06-30 NOTE — Assessment & Plan Note (Signed)
Disc goals for lipids and reasons to control them Rev labs with pt Rev low sat fat diet in detail Fairly stable- trig up due to sugar Rev plan for wt loss F/u 6 mo

## 2011-06-30 NOTE — Progress Notes (Signed)
Subjective:    Patient ID: Daniel Lee, male    DOB: 1956-02-02, 55 y.o.   MRN: [id number]  HPI Here for health maintenance exam and to review chronic medical problems   Doing well overall  Nothing new overall   Wt is up 5 lb with bmi of 36 Thinks he probably has gained   Diet- is trying to do better is still eating too much  Harder to exercise with short days Knows he needs to cut portions    Last urol visit  All has been ok  psa 4.6 -- has been ? Higher at urology  Seen for bph too  Takes his avodart  Nocturia 2 tmes per night   Hyperglycemia Lab Results  Component Value Date   HGBA1C 6.4 06/23/2011   up from 6.2 Diet -- is staying away from sweets for the most part -- once per week  Is close to DM   Lipids -trig up a bit Lab Results  Component Value Date   CHOL 171 06/23/2011   CHOL 157 12/19/2010   CHOL 158 06/23/2010   Lab Results  Component Value Date   HDL 37.20* 06/23/2011   HDL 35.60* 12/19/2010   HDL 29.56* 06/23/2010   Lab Results  Component Value Date   LDLCALC 95 12/19/2010   LDLCALC 104* 06/23/2010   LDLCALC 75 10/21/2009   Lab Results  Component Value Date   TRIG 210.0* 06/23/2011   TRIG 134.0 12/19/2010   TRIG 106.0 06/23/2010   Lab Results  Component Value Date   CHOLHDL 5 06/23/2011   CHOLHDL 4 12/19/2010   CHOLHDL 5 06/23/2010   Lab Results  Component Value Date   LDLDIRECT 99.9 06/23/2011   HDL 2 pts up  Expect improvement with better habits   bp is  136/84   Today No cp or palpitations or headaches or edema  No side effects to medicines    lfts nl   Colon screen -never had a colonoscopy  [place] to go to place [place] -- ?  ? If needs a referral  Will set up himself   Flu shot - in oct   Patient Active Problem List  Diagnoses  . Hyperglycemia  . PURE HYPERCHOLESTEROLEMIA  . LOW HDL  . OBESITY, UNSPECIFIED  . ERECTILE DYSFUNCTION  . ESSENTIAL HYPERTENSION, BENIGN  . PROSTATE SPECIFIC ANTIGEN, ELEVATED  .  BENIGN PROSTATIC HYPERTROPHY, HX OF  . Routine general medical examination at a health care facility   Past Medical History  Diagnosis Date  . Hypertension   . BPH (benign prostatic hypertrophy)   . ED (erectile dysfunction)   . Hyperglycemia   . Elevated alanine aminotransferase (ALT) level     suspect fatty liver   Past Surgical History  Procedure Date  . Prostate surgery 2003    prostate biopsy negative // urologist  . Abd ultrasound 11/2005    negative   History  Substance Use Topics  . Smoking status: Never Smoker   . Smokeless tobacco: Not on file  . Alcohol Use: Yes     rarely   Family History  Problem Relation Age of Onset  . Hypertension Mother   . Hyperlipidemia Mother   . Cancer Father     prostate cancer   . Diabetes Father   . Stroke Father   . Heart disease Brother   . Diabetes Brother   . Kidney disease Brother   . Cancer Paternal Uncle     prostate cancer  No Known Allergies Current Outpatient Prescriptions on File Prior to Visit  Medication Sig Dispense Refill  . aspirin 81 MG EC tablet As needed       . dutasteride (AVODART) 0.5 MG capsule One capsule weekly       . glucose blood (ONE TOUCH ULTRA TEST) test strip Check blood sugar twice daily       . ONETOUCH DELICA LANCETS MISC Check blood sugar twice a day           Review of Systems Review of Systems  Constitutional: Negative for fever, appetite change, fatigue and unexpected weight change.  Eyes: Negative for pain and visual disturbance.  Respiratory: Negative for cough and shortness of breath.   Cardiovascular: Negative for cp or palpitations    Gastrointestinal: Negative for nausea, diarrhea and constipation.  Genitourinary: Negative for urgency and frequency.  Skin: Negative for pallor or rash   Neurological: Negative for weakness, light-headedness, numbness and headaches.  Hematological: Negative for adenopathy. Does not bruise/bleed easily.  Psychiatric/Behavioral: Negative for  dysphoric mood. The patient is not nervous/anxious.          Objective:   Physical Exam  Constitutional: He appears well-developed and well-nourished. No distress.       overwt and well appearing   HENT:  Head: Normocephalic and atraumatic.  Right Ear: External ear normal.  Left Ear: External ear normal.  Nose: Nose normal.  Mouth/Throat: Oropharynx is clear and moist.  Eyes: Conjunctivae and EOM are normal. Pupils are equal, round, and reactive to light. No scleral icterus.  Neck: Normal range of motion. Neck supple. No JVD present. Carotid bruit is not present. No thyromegaly present.  Cardiovascular: Normal rate, regular rhythm, normal heart sounds and intact distal pulses.  Exam reveals no gallop.   Pulmonary/Chest: Effort normal and breath sounds normal. No respiratory distress. He has no wheezes. He exhibits no tenderness.  Abdominal: Soft. Bowel sounds are normal. He exhibits no distension, no abdominal bruit and no mass. There is no tenderness.  Musculoskeletal: Normal range of motion. He exhibits no edema and no tenderness.  Lymphadenopathy:    He has no cervical adenopathy.  Neurological: He is alert. He has normal reflexes. No cranial nerve deficit. He exhibits normal muscle tone. Coordination normal.  Skin: Skin is warm and dry. No rash noted. No erythema. No pallor.       Solar lentigos and SK s diffusely  Psychiatric: He has a normal mood and affect.          Assessment & Plan:

## 2011-06-30 NOTE — Assessment & Plan Note (Signed)
Worse- borderline dm Lab Results  Component Value Date   HGBA1C 6.4 06/23/2011    Long disc about low glycemic diet/exercise/obesity and plan made Lab and f/u 6 mo

## 2011-06-30 NOTE — Patient Instructions (Signed)
Make sure to schedule your own colonoscopy  For weight loss - cut portions by at least 1/3  Exercise at least 30 minutes 5 days per week Consider the weight watchers for men -- a wonderful program  - on line or meetings  Cut out sugar best you can  Keep starches -- small portions  Schedule fasting labs and then follow up in 6 months please

## 2011-12-29 ENCOUNTER — Other Ambulatory Visit (INDEPENDENT_AMBULATORY_CARE_PROVIDER_SITE_OTHER): Payer: BC Managed Care – PPO

## 2011-12-29 DIAGNOSIS — R7309 Other abnormal glucose: Secondary | ICD-10-CM

## 2011-12-29 DIAGNOSIS — I10 Essential (primary) hypertension: Secondary | ICD-10-CM

## 2011-12-29 DIAGNOSIS — E7800 Pure hypercholesterolemia, unspecified: Secondary | ICD-10-CM

## 2011-12-29 DIAGNOSIS — R739 Hyperglycemia, unspecified: Secondary | ICD-10-CM

## 2011-12-29 LAB — LIPID PANEL
HDL: 37 mg/dL — ABNORMAL LOW (ref >39.00)
LDL Cholesterol: 83 mg/dL (ref 0–99)
Total CHOL/HDL Ratio: 4
Triglycerides: 175 mg/dL — ABNORMAL HIGH (ref 0.0–149.0)
VLDL: 35 mg/dL (ref 0.0–40.0)

## 2011-12-29 LAB — COMPREHENSIVE METABOLIC PANEL
ALT: 43 U/L (ref 0–53)
AST: 34 U/L (ref 0–37)
Alkaline Phosphatase: 73 U/L (ref 39–117)
Creatinine, Ser: 0.9 mg/dL (ref 0.4–1.5)
Sodium: 143 mEq/L (ref 135–145)
Total Bilirubin: 1.8 mg/dL — ABNORMAL HIGH (ref 0.3–1.2)

## 2012-01-05 ENCOUNTER — Ambulatory Visit: Payer: BC Managed Care – PPO | Admitting: Family Medicine

## 2012-01-12 ENCOUNTER — Encounter: Payer: Self-pay | Admitting: Family Medicine

## 2012-01-12 ENCOUNTER — Ambulatory Visit (INDEPENDENT_AMBULATORY_CARE_PROVIDER_SITE_OTHER): Payer: BC Managed Care – PPO | Admitting: Family Medicine

## 2012-01-12 VITALS — BP 145/93 | HR 90 | Temp 98.2°F | Ht 69.0 in | Wt 240.2 lb

## 2012-01-12 DIAGNOSIS — I10 Essential (primary) hypertension: Secondary | ICD-10-CM

## 2012-01-12 DIAGNOSIS — R7309 Other abnormal glucose: Secondary | ICD-10-CM

## 2012-01-12 DIAGNOSIS — R739 Hyperglycemia, unspecified: Secondary | ICD-10-CM

## 2012-01-12 DIAGNOSIS — E7800 Pure hypercholesterolemia, unspecified: Secondary | ICD-10-CM

## 2012-01-12 DIAGNOSIS — Z87898 Personal history of other specified conditions: Secondary | ICD-10-CM

## 2012-01-12 MED ORDER — DUTASTERIDE 0.5 MG PO CAPS: 0.5000 mg | ORAL_CAPSULE | ORAL | Status: DC

## 2012-01-12 MED ORDER — ONETOUCH DELICA LANCETS MISC: Status: DC

## 2012-01-12 NOTE — Assessment & Plan Note (Signed)
Lipids improved with better lifestyle habits Disc goals for lipids and reasons to control them Rev labs with pt Rev low sat fat diet in detail

## 2012-01-12 NOTE — Assessment & Plan Note (Signed)
Needs new urol- ins reasons Will refer Filled avodart in the interim Stable symptoms Hx of elevated psa

## 2012-01-12 NOTE — Assessment & Plan Note (Signed)
bp in fair control at this time  No changes needed  Disc lifstyle change with low sodium diet and exercise   Labs reviewed Enc further wt loss

## 2012-01-12 NOTE — Progress Notes (Signed)
Subjective:    Patient ID: Daniel Lee, male    DOB: 30-Sep-1955, 56 y.o.   MRN: [id number]  HPI Here for f/u of chronic medical problems  Feels ok  Nothing new health wise   Has to change urologists- Dr Reuel Boom , and no longer takes insurance Has BPH 2 times nocturia  avodart .5 mg - takes it once per week    bp is  Up a bit    Today No cp or palpitations or headaches or edema  No side effects to medicines   BP Readings from Last 3 Encounters:  01/12/12 145/93  06/30/11 136/84  12/24/10 124/88   he checks it at work - was normal / good control  Is on losartan  Cholesterol  Diet managed Lab Results  Component Value Date   CHOL 155 12/29/2011   CHOL 171 06/23/2011   CHOL 157 12/19/2010   Lab Results  Component Value Date   HDL 37.00* 12/29/2011   HDL 37.20* 06/23/2011   HDL 35.60* 12/19/2010   Lab Results  Component Value Date   LDLCALC 83 12/29/2011   LDLCALC 95 12/19/2010   LDLCALC 104* 06/23/2010   Lab Results  Component Value Date   TRIG 175.0* 12/29/2011   TRIG 210.0* 06/23/2011   TRIG 134.0 12/19/2010   Lab Results  Component Value Date   CHOLHDL 4 12/29/2011   CHOLHDL 5 06/23/2011   CHOLHDL 4 12/19/2010   Lab Results  Component Value Date   LDLDIRECT 99.9 06/23/2011    Wt lost is 8 lb Has been walking , now using elliptical at least 3-4 days per week - outdoors more also  Also changed his eating  Would like to weigh less than 200  bmi of 35  hyperglycmia also better with a1c of 6.0 Is happy about that  No thirst or excess urination Is watching sugar in his diet   Patient Active Problem List  Diagnosis  . Hyperglycemia  . PURE HYPERCHOLESTEROLEMIA  . LOW HDL  . OBESITY, UNSPECIFIED  . ERECTILE DYSFUNCTION  . ESSENTIAL HYPERTENSION, BENIGN  . PROSTATE SPECIFIC ANTIGEN, ELEVATED  . BENIGN PROSTATIC HYPERTROPHY, HX OF  . Routine general medical examination at a health care facility   Past Medical History  Diagnosis Date  . Hypertension   .  BPH (benign prostatic hypertrophy)   . ED (erectile dysfunction)   . Hyperglycemia   . Elevated alanine aminotransferase (ALT) level     suspect fatty liver   Past Surgical History  Procedure Date  . Prostate surgery 2003    prostate biopsy negative // urologist  . Abd ultrasound 11/2005    negative   History  Substance Use Topics  . Smoking status: Former Games developer  . Smokeless tobacco: Not on file  . Alcohol Use: No     rarely   Family History  Problem Relation Age of Onset  . Hypertension Mother   . Hyperlipidemia Mother   . Cancer Father     prostate cancer   . Diabetes Father   . Stroke Father   . Heart disease Brother   . Diabetes Brother   . Kidney disease Brother   . Cancer Paternal Uncle     prostate cancer   No Known Allergies Current Outpatient Prescriptions on File Prior to Visit  Medication Sig Dispense Refill  . losartan-hydrochlorothiazide (HYZAAR) 100-25 MG per tablet Take 1 tablet by mouth daily.  90 tablet  3  . aspirin 81 MG EC tablet As needed       .  glucose blood (ONE TOUCH ULTRA TEST) test strip Check blood sugar twice daily         Review of Systems     Objective:   Physical Exam  Constitutional: He appears well-developed and well-nourished. No distress.  HENT:  Head: Normocephalic and atraumatic.  Mouth/Throat: Oropharynx is clear and moist.  Eyes: Conjunctivae and EOM are normal. Pupils are equal, round, and reactive to light. No scleral icterus.  Neck: Normal range of motion. Neck supple. No JVD present. Carotid bruit is not present. No thyromegaly present.  Cardiovascular: Normal rate, regular rhythm, normal heart sounds and intact distal pulses.  Exam reveals no gallop.   Pulmonary/Chest: Effort normal and breath sounds normal. No respiratory distress. He has no wheezes.  Abdominal: Soft. Bowel sounds are normal. He exhibits no distension, no abdominal bruit and no mass. There is no tenderness.  Musculoskeletal: He exhibits no edema and  no tenderness.  Lymphadenopathy:    He has no cervical adenopathy.  Neurological: He is alert. He has normal reflexes. He exhibits normal muscle tone.  Skin: Skin is warm and dry. No rash noted. No erythema. No pallor.  Psychiatric: He has a normal mood and affect.       Cheerful and talkative           Assessment & Plan:

## 2012-01-12 NOTE — Patient Instructions (Addendum)
Keep working hard on healthy diet and exercise and weight loss Labs look better  Take 81 mg aspirin every day with a meal  Follow up for annual exam/ physical - with me in 6 months  We will do urology referral at check out

## 2012-01-12 NOTE — Assessment & Plan Note (Signed)
Well controlled with diet and wt loss Tends to check sugar 1-2 times per wk No symptoms  Urged to keep up wt loss

## 2012-05-25 ENCOUNTER — Telehealth: Payer: Self-pay

## 2012-05-25 NOTE — Telephone Encounter (Signed)
Tammy with Dr Cope's office request recent PSAs. 06/23/11 PSA faxed as requested. Notified Dr Cope's office fax done.

## 2012-06-02 ENCOUNTER — Other Ambulatory Visit: Payer: Self-pay | Admitting: *Deleted

## 2012-06-02 MED ORDER — GLUCOSE BLOOD VI STRP: ORAL_STRIP | Status: DC

## 2012-06-07 ENCOUNTER — Other Ambulatory Visit: Payer: Self-pay | Admitting: *Deleted

## 2012-06-27 ENCOUNTER — Telehealth: Payer: Self-pay | Admitting: Family Medicine

## 2012-06-27 DIAGNOSIS — R739 Hyperglycemia, unspecified: Secondary | ICD-10-CM

## 2012-06-27 DIAGNOSIS — E7800 Pure hypercholesterolemia, unspecified: Secondary | ICD-10-CM

## 2012-06-27 DIAGNOSIS — E786 Lipoprotein deficiency: Secondary | ICD-10-CM

## 2012-06-27 DIAGNOSIS — Z0000 Encounter for general adult medical examination without abnormal findings: Secondary | ICD-10-CM

## 2012-06-27 NOTE — Telephone Encounter (Signed)
Message copied by Judy Pimple on Mon Jun 27, 2012  7:19 PM ------      Message from: Daniel Lee      Created: Wed Jun 22, 2012  1:26 PM      Regarding: Cpx labs Tues 12/17       Please order  future cpx labs for pt's upcoming lab appt.      Thanks      Rodney Booze

## 2012-06-28 ENCOUNTER — Other Ambulatory Visit (INDEPENDENT_AMBULATORY_CARE_PROVIDER_SITE_OTHER): Payer: BC Managed Care – PPO

## 2012-06-28 DIAGNOSIS — R7309 Other abnormal glucose: Secondary | ICD-10-CM

## 2012-06-28 DIAGNOSIS — R739 Hyperglycemia, unspecified: Secondary | ICD-10-CM

## 2012-06-28 DIAGNOSIS — E786 Lipoprotein deficiency: Secondary | ICD-10-CM

## 2012-06-28 DIAGNOSIS — E7800 Pure hypercholesterolemia, unspecified: Secondary | ICD-10-CM

## 2012-06-28 DIAGNOSIS — Z0000 Encounter for general adult medical examination without abnormal findings: Secondary | ICD-10-CM

## 2012-06-28 LAB — COMPREHENSIVE METABOLIC PANEL
ALT: 34 U/L (ref 0–53)
Albumin: 4.5 g/dL (ref 3.5–5.2)
Alkaline Phosphatase: 74 U/L (ref 39–117)
CO2: 27 mEq/L (ref 19–32)
GFR: 86.48 mL/min (ref >60.00)
Glucose, Bld: 125 mg/dL — ABNORMAL HIGH (ref 70–99)
Potassium: 3.8 mEq/L (ref 3.5–5.1)
Sodium: 138 mEq/L (ref 135–145)
Total Bilirubin: 2 mg/dL — ABNORMAL HIGH (ref 0.3–1.2)
Total Protein: 7.4 g/dL (ref 6.0–8.3)

## 2012-06-28 LAB — CBC WITH DIFFERENTIAL/PLATELET
Basophils Absolute: 0 10*3/uL (ref 0.0–0.1)
Eosinophils Relative: 0.8 % (ref 0.0–5.0)
Lymphocytes Relative: 35.7 % (ref 12.0–46.0)
Monocytes Relative: 11.7 % (ref 3.0–12.0)
Neutrophils Relative %: 51.2 % (ref 43.0–77.0)
Platelets: 167 10*3/uL (ref 150.0–400.0)
RDW: 13.3 % (ref 11.5–14.6)
WBC: 6.2 10*3/uL (ref 4.5–10.5)

## 2012-06-28 LAB — HEMOGLOBIN A1C: Hgb A1c MFr Bld: 6.1 % (ref 4.6–6.5)

## 2012-06-28 LAB — LIPID PANEL
HDL: 33.4 mg/dL — ABNORMAL LOW (ref >39.00)
Total CHOL/HDL Ratio: 5
VLDL: 36.2 mg/dL (ref 0.0–40.0)

## 2012-06-28 LAB — TSH: TSH: 3.4 u[IU]/mL (ref 0.35–5.50)

## 2012-07-01 ENCOUNTER — Encounter: Payer: Self-pay | Admitting: Family Medicine

## 2012-07-01 ENCOUNTER — Ambulatory Visit (INDEPENDENT_AMBULATORY_CARE_PROVIDER_SITE_OTHER): Payer: BC Managed Care – PPO | Admitting: Family Medicine

## 2012-07-01 VITALS — BP 130/80 | HR 80 | Temp 99.0°F | Ht 68.75 in | Wt 239.0 lb

## 2012-07-01 DIAGNOSIS — Z0000 Encounter for general adult medical examination without abnormal findings: Secondary | ICD-10-CM

## 2012-07-01 DIAGNOSIS — R7309 Other abnormal glucose: Secondary | ICD-10-CM

## 2012-07-01 DIAGNOSIS — E669 Obesity, unspecified: Secondary | ICD-10-CM

## 2012-07-01 DIAGNOSIS — R17 Unspecified jaundice: Secondary | ICD-10-CM

## 2012-07-01 DIAGNOSIS — E7800 Pure hypercholesterolemia, unspecified: Secondary | ICD-10-CM

## 2012-07-01 DIAGNOSIS — I10 Essential (primary) hypertension: Secondary | ICD-10-CM

## 2012-07-01 DIAGNOSIS — R739 Hyperglycemia, unspecified: Secondary | ICD-10-CM

## 2012-07-01 MED ORDER — LOSARTAN POTASSIUM-HCTZ 100-25 MG PO TABS: 1.0000 | ORAL_TABLET | Freq: Every day | ORAL | Status: DC

## 2012-07-01 NOTE — Assessment & Plan Note (Signed)
Chronic and no symptoms Suspect Gilbert's  Will re check at 6 mo f/u

## 2012-07-01 NOTE — Assessment & Plan Note (Signed)
Well controlled with good diet and exercise Rev low glycemic diet - will continue to work on that  F/u 6 mo

## 2012-07-01 NOTE — Assessment & Plan Note (Signed)
Discussed how this problem influences overall health and the risks it imposes  Reviewed plan for weight loss with lower calorie diet (via better food choices and also portion control or program like weight watchers) and exercise building up to or more than 30 minutes 5 days per week including some aerobic activity   Commended-pt is working hard on wt loss

## 2012-07-01 NOTE — Assessment & Plan Note (Signed)
Disc goals for lipids and reasons to control them Rev labs with pt Rev low sat fat diet in detail  Will continue working on HDL with diet and exercise as well

## 2012-07-01 NOTE — Progress Notes (Signed)
Subjective:    Patient ID: Daniel Lee, male    DOB: Oct 26, 1955, 56 y.o.   MRN: [id number]  HPI Here for health maintenance exam and to review chronic medical problems  Has been feeling good overall  No new complaints   Wt is down 1 lb with bmi of 25  colonosc 11/13- glad to get over with  Polyps - and has 10 year recall    Mood- has been good and stays motivated   Has BPH/ hx of inc psa-urology- saw Dr Achilles Dunk in November - went well and had f/u in may Is on finesteride every day  Will f/u in May   bp is up a bit on first check  No cp or palpitations or headaches or edema  No side effects to medicines  BP Readings from Last 3 Encounters:  07/01/12 150/90  01/12/12 145/93  06/30/11 136/84    Has been up a bit outside the office also He takes his med at night   Is exercising at night - (in the summer he walks)- now uses elliptical 30 min 5 days per week  Has also been trying hard to eat healthy    Hyperglycemia Lab Results  Component Value Date   HGBA1C 6.1 06/28/2012   staying below DM range  He checks sugar at home - sugars have been good at home     Hyperlipidemia  Lab Results  Component Value Date   CHOL 167 06/28/2012   CHOL 155 12/29/2011   CHOL 171 06/23/2011   Lab Results  Component Value Date   HDL 33.40* 06/28/2012   HDL 37.00* 12/29/2011   HDL 37.20* 06/23/2011   Lab Results  Component Value Date   LDLCALC 97 06/28/2012   LDLCALC 83 12/29/2011   LDLCALC 95 12/19/2010   Lab Results  Component Value Date   TRIG 181.0* 06/28/2012   TRIG 175.0* 12/29/2011   TRIG 210.0* 06/23/2011   Lab Results  Component Value Date   CHOLHDL 5 06/28/2012   CHOLHDL 4 12/29/2011   CHOLHDL 5 06/23/2011   Lab Results  Component Value Date   LDLDIRECT 99.9 06/23/2011   is eating fish and lot of nuts and exercising -trying to get HDL up   Bilirubin is 2.0-has been high in the past  Does not drink alcohol No abd pain No jaundice No hx of hepatitis Has had  high bilirubin all his life  Patient Active Problem List  Diagnosis  . Hyperglycemia  . PURE HYPERCHOLESTEROLEMIA  . LOW HDL  . OBESITY, UNSPECIFIED  . ERECTILE DYSFUNCTION  . ESSENTIAL HYPERTENSION, BENIGN  . PROSTATE SPECIFIC ANTIGEN, ELEVATED  . BENIGN PROSTATIC HYPERTROPHY, HX OF  . Routine general medical examination at a health care facility   Past Medical History  Diagnosis Date  . Hypertension   . BPH (benign prostatic hypertrophy)   . ED (erectile dysfunction)   . Hyperglycemia   . Elevated alanine aminotransferase (ALT) level     suspect fatty liver   Past Surgical History  Procedure Date  . Prostate surgery 2003    prostate biopsy negative // urologist  . Abd ultrasound 11/2005    negative   History  Substance Use Topics  . Smoking status: Former Games developer  . Smokeless tobacco: Not on file  . Alcohol Use: No     Comment: rarely   Family History  Problem Relation Age of Onset  . Hypertension Mother   . Hyperlipidemia Mother   . Cancer Father  prostate cancer   . Diabetes Father   . Stroke Father   . Heart disease Brother   . Diabetes Brother   . Kidney disease Brother   . Cancer Paternal Uncle     prostate cancer   No Known Allergies Current Outpatient Prescriptions on File Prior to Visit  Medication Sig Dispense Refill  . aspirin 81 MG EC tablet As needed       . glucose blood (ONE TOUCH ULTRA TEST) test strip Check blood sugar twice daily or as directed  300 each  1  . losartan-hydrochlorothiazide (HYZAAR) 100-25 MG per tablet Take 1 tablet by mouth daily.  90 tablet  3  . ONETOUCH DELICA LANCETS MISC To check sugar once daily and as needed for hyperglycemia  100 each  3     Review of Systems Review of Systems  Constitutional: Negative for fever, appetite change, fatigue and unexpected weight change.  Eyes: Negative for pain and visual disturbance.  Respiratory: Negative for cough and shortness of breath.   Cardiovascular: Negative for cp  or palpitations    Gastrointestinal: Negative for nausea, diarrhea and constipation. neg for abd pain or jaudice Genitourinary: Negative for urgency and frequency.  Skin: Negative for pallor or rash   Neurological: Negative for weakness, light-headedness, numbness and headaches.  Hematological: Negative for adenopathy. Does not bruise/bleed easily.  Psychiatric/Behavioral: Negative for dysphoric mood. The patient is not nervous/anxious.         Objective:   Physical Exam  Constitutional: He appears well-developed and well-nourished. No distress.       obese and well appearing   HENT:  Head: Normocephalic and atraumatic.  Right Ear: External ear normal.  Left Ear: External ear normal.  Nose: Nose normal.  Mouth/Throat: Oropharynx is clear and moist. No oropharyngeal exudate.  Eyes: Conjunctivae normal and EOM are normal. Pupils are equal, round, and reactive to light. Right eye exhibits no discharge. Left eye exhibits no discharge. No scleral icterus.  Neck: Normal range of motion. Neck supple. No JVD present. Carotid bruit is not present. No thyromegaly present.  Cardiovascular: Normal rate, regular rhythm, normal heart sounds and intact distal pulses.  Exam reveals no gallop.   Pulmonary/Chest: Breath sounds normal. No respiratory distress. He has no wheezes. He exhibits no tenderness.  Abdominal: Soft. Bowel sounds are normal. He exhibits no distension, no abdominal bruit and no mass. There is no tenderness.  Musculoskeletal: He exhibits no edema and no tenderness.  Lymphadenopathy:    He has no cervical adenopathy.  Neurological: He is alert. He has normal reflexes. No cranial nerve deficit. He exhibits normal muscle tone. Coordination normal.  Skin: Skin is warm and dry. No rash noted. No erythema. No pallor.  Psychiatric: He has a normal mood and affect.          Assessment & Plan:

## 2012-07-01 NOTE — Patient Instructions (Addendum)
Keep up the great work with healthy diet and exercise  Continue urology follow up appts Your blood pressure is good on 2nd check today- so always check it when as relaxed as possible  Follow up in 6 months with labs prior

## 2012-07-01 NOTE — Assessment & Plan Note (Signed)
bp in fair control at this time  No changes needed  Disc lifstyle change with low sodium diet and exercise   Was better on 2nd check and watching closely Med refilled today

## 2012-07-01 NOTE — Assessment & Plan Note (Signed)
Reviewed health habits including diet and exercise and skin cancer prevention Also reviewed health mt list, fam hx and immunizations  Rev wellness labs in detail 

## 2012-12-23 ENCOUNTER — Other Ambulatory Visit (INDEPENDENT_AMBULATORY_CARE_PROVIDER_SITE_OTHER): Payer: BC Managed Care – PPO

## 2012-12-23 DIAGNOSIS — R17 Unspecified jaundice: Secondary | ICD-10-CM

## 2012-12-23 DIAGNOSIS — R739 Hyperglycemia, unspecified: Secondary | ICD-10-CM

## 2012-12-23 DIAGNOSIS — R7309 Other abnormal glucose: Secondary | ICD-10-CM

## 2012-12-23 LAB — HEMOGLOBIN A1C: Hgb A1c MFr Bld: 6.4 % (ref 4.6–6.5)

## 2012-12-24 LAB — BILIRUBIN, FRACTIONATED(TOT/DIR/INDIR): Indirect Bilirubin: 1.4 mg/dL — ABNORMAL HIGH (ref 0.0–0.9)

## 2012-12-28 ENCOUNTER — Encounter: Payer: Self-pay | Admitting: Family Medicine

## 2012-12-28 ENCOUNTER — Ambulatory Visit (INDEPENDENT_AMBULATORY_CARE_PROVIDER_SITE_OTHER): Payer: BC Managed Care – PPO | Admitting: Family Medicine

## 2012-12-28 VITALS — BP 130/82 | HR 82 | Temp 98.5°F | Ht 68.75 in | Wt 246.8 lb

## 2012-12-28 DIAGNOSIS — R7309 Other abnormal glucose: Secondary | ICD-10-CM

## 2012-12-28 DIAGNOSIS — R17 Unspecified jaundice: Secondary | ICD-10-CM

## 2012-12-28 DIAGNOSIS — R739 Hyperglycemia, unspecified: Secondary | ICD-10-CM

## 2012-12-28 DIAGNOSIS — E669 Obesity, unspecified: Secondary | ICD-10-CM

## 2012-12-28 DIAGNOSIS — I10 Essential (primary) hypertension: Secondary | ICD-10-CM

## 2012-12-28 NOTE — Assessment & Plan Note (Signed)
Bilirubin is stable/ no symptoms Suspect Gilberts Will continue to watch

## 2012-12-28 NOTE — Assessment & Plan Note (Signed)
A1c is up to 6.4 Disc need for wt loss and exercise -he has done DM teaching and knows how to eat low glycemic Disc wt loss programs to choose from also  F/u 3 mo with lab prior

## 2012-12-28 NOTE — Assessment & Plan Note (Signed)
bp in fair control at this time  No changes needed  Disc lifstyle change with low sodium diet and exercise  Lab reviewed  F/u 3 mo

## 2012-12-28 NOTE — Patient Instructions (Addendum)
You need to loose weight  Work on loosing at least 1 lb per week with diet and exercise For high sugar - avoid sweets and sugar drinks and cut calories  Exercise at least 5 days per week - 30 minutes  Think about weight watchers program or use APP called "myfitnesspal" Schedule fasting lab in 3 months and then follow up
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
## 2012-12-29 NOTE — Assessment & Plan Note (Signed)
Discussed how this problem influences overall health and the risks it imposes  Reviewed plan for weight loss with lower calorie diet (via better food choices and also portion control or program like weight watchers) and exercise building up to or more than 30 minutes 5 days per week including some aerobic activity   disc trial of an accountability program

## 2012-12-30 ENCOUNTER — Ambulatory Visit: Payer: BC Managed Care – PPO | Admitting: Family Medicine

## 2013-03-23 ENCOUNTER — Telehealth: Payer: Self-pay | Admitting: Family Medicine

## 2013-03-23 DIAGNOSIS — R739 Hyperglycemia, unspecified: Secondary | ICD-10-CM

## 2013-03-23 DIAGNOSIS — I10 Essential (primary) hypertension: Secondary | ICD-10-CM

## 2013-03-23 NOTE — Telephone Encounter (Signed)
Message copied by Judy Pimple on Thu Mar 23, 2013  4:56 PM ------      Message from: Alvina Chou      Created: Thu Mar 16, 2013  4:10 PM      Regarding: Lab orders for Friday, 9.12.14       Lab orders for a F/U ------

## 2013-03-24 ENCOUNTER — Other Ambulatory Visit: Payer: BC Managed Care – PPO

## 2013-03-31 ENCOUNTER — Ambulatory Visit: Payer: BC Managed Care – PPO | Admitting: Family Medicine

## 2013-04-20 ENCOUNTER — Other Ambulatory Visit (INDEPENDENT_AMBULATORY_CARE_PROVIDER_SITE_OTHER): Payer: BC Managed Care – PPO

## 2013-04-20 DIAGNOSIS — R7309 Other abnormal glucose: Secondary | ICD-10-CM

## 2013-04-20 DIAGNOSIS — I10 Essential (primary) hypertension: Secondary | ICD-10-CM

## 2013-04-20 DIAGNOSIS — R739 Hyperglycemia, unspecified: Secondary | ICD-10-CM

## 2013-04-20 LAB — COMPREHENSIVE METABOLIC PANEL
ALT: 38 U/L (ref 0–53)
CO2: 29 mEq/L (ref 19–32)
Calcium: 9.7 mg/dL (ref 8.4–10.5)
Chloride: 104 mEq/L (ref 96–112)
GFR: 82.23 mL/min (ref >60.00)
Potassium: 4.6 mEq/L (ref 3.5–5.1)
Sodium: 140 mEq/L (ref 135–145)
Total Bilirubin: 2.2 mg/dL — ABNORMAL HIGH (ref 0.3–1.2)
Total Protein: 7.6 g/dL (ref 6.0–8.3)

## 2013-04-20 LAB — HEMOGLOBIN A1C: Hgb A1c MFr Bld: 6.2 % (ref 4.6–6.5)

## 2013-04-25 ENCOUNTER — Ambulatory Visit: Payer: BC Managed Care – PPO | Admitting: Family Medicine

## 2013-04-26 ENCOUNTER — Encounter: Payer: Self-pay | Admitting: Family Medicine

## 2013-04-26 ENCOUNTER — Ambulatory Visit (INDEPENDENT_AMBULATORY_CARE_PROVIDER_SITE_OTHER): Payer: BC Managed Care – PPO | Admitting: Family Medicine

## 2013-04-26 VITALS — BP 132/88 | HR 85 | Temp 98.2°F | Ht 68.75 in | Wt 245.2 lb

## 2013-04-26 DIAGNOSIS — E669 Obesity, unspecified: Secondary | ICD-10-CM

## 2013-04-26 DIAGNOSIS — R7309 Other abnormal glucose: Secondary | ICD-10-CM

## 2013-04-26 DIAGNOSIS — R739 Hyperglycemia, unspecified: Secondary | ICD-10-CM

## 2013-04-26 DIAGNOSIS — I10 Essential (primary) hypertension: Secondary | ICD-10-CM

## 2013-04-26 DIAGNOSIS — Z23 Encounter for immunization: Secondary | ICD-10-CM

## 2013-04-26 MED ORDER — LOSARTAN POTASSIUM-HCTZ 100-25 MG PO TABS: 1.0000 | ORAL_TABLET | Freq: Every day | ORAL | Status: DC

## 2013-04-26 NOTE — Patient Instructions (Addendum)
A1c is looking better  I'm glad you had your flu shot  Keep working on low sugar diet  Add back exercise now that you can  Work hard on weight loss   Follow up in 3 months with labs prior (fasting)

## 2013-04-26 NOTE — Progress Notes (Signed)
Subjective:    Patient ID: Daniel Lee, male    DOB: Feb 04, 1956, 57 y.o.   MRN: [id number]  HPI Here for f/u of chronic health problems   Hyperglycemia Lab Results  Component Value Date   HGBA1C 6.2 04/20/2013   this is down from 6.4   bp is stable today  No cp or palpitations or headaches or edema  No side effects to medicines  BP Readings from Last 3 Encounters:  04/26/13 132/88  12/28/12 130/82  07/01/12 130/80      Wt is down 1 lb with bmi of 36 He was doing well for a while  Then his son was dx sub arach hemorrhage/cyst - now he is just in out of hosp with him and taking care of him  No time to care of himself at all  (his son is doing better now)   Diet- overall watching what he is eating  No sweets at all in general  Starches -- eating less of it / pasta is downfall  Eats chicken - a lot and a lot of peanut butter   Is a stress eater    Exercise- was daily until all the stress happened   Flu vaccine-had it today   Patient Active Problem List   Diagnosis Date Noted  . Hyperbilirubinemia 07/01/2012  . Routine general medical examination at a health care facility 06/22/2011  . ESSENTIAL HYPERTENSION, BENIGN 04/25/2009  . PURE HYPERCHOLESTEROLEMIA 07/19/2008  . Hyperglycemia 04/16/2008  . OBESITY, UNSPECIFIED 04/16/2008  . LOW HDL 03/18/2007  . ERECTILE DYSFUNCTION 10/07/2006  . PROSTATE SPECIFIC ANTIGEN, ELEVATED 10/07/2006  . BENIGN PROSTATIC HYPERTROPHY, HX OF 10/07/2006   Past Medical History  Diagnosis Date  . Hypertension   . BPH (benign prostatic hypertrophy)   . ED (erectile dysfunction)   . Hyperglycemia   . Elevated alanine aminotransferase (ALT) level     suspect fatty liver   Past Surgical History  Procedure Laterality Date  . Prostate surgery  2003    prostate biopsy negative // urologist  . Abd ultrasound  11/2005    negative   History  Substance Use Topics  . Smoking status: Former Games developer  . Smokeless tobacco: Not on file   . Alcohol Use: No     Comment: rarely   Family History  Problem Relation Age of Onset  . Hypertension Mother   . Hyperlipidemia Mother   . Cancer Father     prostate cancer   . Diabetes Father   . Stroke Father   . Heart disease Brother   . Diabetes Brother   . Kidney disease Brother   . Cancer Paternal Uncle     prostate cancer   No Known Allergies Current Outpatient Prescriptions on File Prior to Visit  Medication Sig Dispense Refill  . aspirin 81 MG EC tablet Take 81 mg by mouth daily.       . finasteride (PROSCAR) 5 MG tablet Take 1 tablet by mouth daily.      Marland Kitchen glucose blood (ONE TOUCH ULTRA TEST) test strip Check blood sugar twice daily or as directed  300 each  1  . losartan-hydrochlorothiazide (HYZAAR) 100-25 MG per tablet Take 1 tablet by mouth daily.  90 tablet  3  . ONETOUCH DELICA LANCETS MISC To check sugar once daily and as needed for hyperglycemia  100 each  3   No current facility-administered medications on file prior to visit.    Review of Systems Review of Systems  Constitutional: Negative for  fever, appetite change, fatigue and unexpected weight change.  Eyes: Negative for pain and visual disturbance.  Respiratory: Negative for cough and shortness of breath.   Cardiovascular: Negative for cp or palpitations    Gastrointestinal: Negative for nausea, diarrhea and constipation.  Genitourinary: Negative for urgency and frequency. neg for excessive thirst  Skin: Negative for pallor or rash   Neurological: Negative for weakness, light-headedness, numbness and headaches.  Hematological: Negative for adenopathy. Does not bruise/bleed easily.  Psychiatric/Behavioral: Negative for dysphoric mood. The patient is not nervous/anxious.         Objective:   Physical Exam  Constitutional: He appears well-developed and well-nourished. No distress.  obese and well appearing   HENT:  Head: Normocephalic and atraumatic.  Right Ear: External ear normal.  Left Ear:  External ear normal.  Nose: Nose normal.  Mouth/Throat: Oropharynx is clear and moist.  Eyes: Conjunctivae and EOM are normal. Pupils are equal, round, and reactive to light. Right eye exhibits no discharge. Left eye exhibits no discharge. No scleral icterus.  Neck: Normal range of motion. Neck supple. No JVD present. Carotid bruit is not present. No thyromegaly present.  Cardiovascular: Normal rate, regular rhythm, normal heart sounds and intact distal pulses.  Exam reveals no gallop.   Pulmonary/Chest: Effort normal and breath sounds normal. No respiratory distress. He has no wheezes. He exhibits no tenderness.  Abdominal: Soft. Bowel sounds are normal. He exhibits no distension and no abdominal bruit. There is no tenderness.  Musculoskeletal: He exhibits no edema and no tenderness.  Lymphadenopathy:    He has no cervical adenopathy.  Neurological: He is alert. He has normal reflexes. No cranial nerve deficit. He exhibits normal muscle tone. Coordination normal.  Skin: Skin is warm and dry. No rash noted. No erythema. No pallor.  Psychiatric: He has a normal mood and affect.          Assessment & Plan:

## 2013-04-27 NOTE — Assessment & Plan Note (Signed)
Discussed how this problem influences overall health and the risks it imposes  Reviewed plan for weight loss with lower calorie diet (via better food choices and also portion control or program like weight watchers) and exercise building up to or more than 30 minutes 5 days per week including some aerobic activity    

## 2013-04-27 NOTE — Assessment & Plan Note (Signed)
bp in fair control at this time  No changes needed  Disc lifstyle change with low sodium diet and exercise  Lab rev 

## 2013-04-27 NOTE — Assessment & Plan Note (Signed)
Lab Results  Component Value Date   HGBA1C 6.2 04/20/2013   slt improved  Rev need for low glycemic diet/ wt loss and more exercise F/u planned

## 2013-07-21 ENCOUNTER — Other Ambulatory Visit: Payer: BC Managed Care – PPO

## 2013-07-28 ENCOUNTER — Ambulatory Visit: Payer: BC Managed Care – PPO | Admitting: Family Medicine

## 2013-08-21 ENCOUNTER — Other Ambulatory Visit (INDEPENDENT_AMBULATORY_CARE_PROVIDER_SITE_OTHER): Payer: BC Managed Care – PPO

## 2013-08-21 DIAGNOSIS — R7309 Other abnormal glucose: Secondary | ICD-10-CM

## 2013-08-21 DIAGNOSIS — I10 Essential (primary) hypertension: Secondary | ICD-10-CM

## 2013-08-21 DIAGNOSIS — R739 Hyperglycemia, unspecified: Secondary | ICD-10-CM

## 2013-08-21 LAB — LIPID PANEL
CHOL/HDL RATIO: 4
CHOLESTEROL: 149 mg/dL (ref 0–200)
HDL: 34.7 mg/dL — AB (ref >39.00)
LDL CALC: 87 mg/dL (ref 0–99)
TRIGLYCERIDES: 137 mg/dL (ref 0.0–149.0)
VLDL: 27.4 mg/dL (ref 0.0–40.0)

## 2013-08-21 LAB — HEMOGLOBIN A1C: Hgb A1c MFr Bld: 6.4 % (ref 4.6–6.5)

## 2013-08-28 ENCOUNTER — Encounter: Payer: Self-pay | Admitting: Family Medicine

## 2013-08-28 ENCOUNTER — Ambulatory Visit (INDEPENDENT_AMBULATORY_CARE_PROVIDER_SITE_OTHER): Payer: BC Managed Care – PPO | Admitting: Family Medicine

## 2013-08-28 VITALS — BP 140/86 | HR 88 | Temp 98.2°F | Ht 68.75 in | Wt 253.2 lb

## 2013-08-28 DIAGNOSIS — E7800 Pure hypercholesterolemia, unspecified: Secondary | ICD-10-CM

## 2013-08-28 DIAGNOSIS — I10 Essential (primary) hypertension: Secondary | ICD-10-CM

## 2013-08-28 DIAGNOSIS — R7309 Other abnormal glucose: Secondary | ICD-10-CM

## 2013-08-28 DIAGNOSIS — R739 Hyperglycemia, unspecified: Secondary | ICD-10-CM

## 2013-08-28 DIAGNOSIS — E669 Obesity, unspecified: Secondary | ICD-10-CM | POA: Insufficient documentation

## 2013-08-28 NOTE — Progress Notes (Signed)
Pre-visit discussion using our clinic review tool. No additional management support is needed unless otherwise documented below in the visit note.  

## 2013-08-28 NOTE — Assessment & Plan Note (Signed)
BP: 140/86 mmHg  This is up a bit  Enc further exercise and harder work on wt loss If no imp at next visit may need to adj tx

## 2013-08-28 NOTE — Progress Notes (Signed)
Subjective:    Patient ID: Daniel Lee, male    DOB: [birth year], 58 y.o.   MRN: [id number]  HPI Here for f/u of chronic medical problems   Doing ok overall   Wt is up 8 lb with bmi of 37    Walking almost every day - for almost an hour- he is proud of that   bp is up a bit today  No cp or palpitations or headaches or edema  No side effects to medicines  BP Readings from Last 3 Encounters:  08/28/13 140/86  04/26/13 132/88  12/28/12 130/82      Hyperglycemia -  Lab Results  Component Value Date   HGBA1C 6.4 08/21/2013   was 6.2   No DM symptoms   Hyperlipidemia  Lab Results  Component Value Date   CHOL 149 08/21/2013   CHOL 167 06/28/2012   CHOL 155 12/29/2011   Lab Results  Component Value Date   HDL 34.70* 08/21/2013   HDL 33.40* 06/28/2012   HDL 37.00* 12/29/2011   Lab Results  Component Value Date   LDLCALC 87 08/21/2013   LDLCALC 97 06/28/2012   LDLCALC 83 12/29/2011   Lab Results  Component Value Date   TRIG 137.0 08/21/2013   TRIG 181.0* 06/28/2012   TRIG 175.0* 12/29/2011   Lab Results  Component Value Date   CHOLHDL 4 08/21/2013   CHOLHDL 5 06/28/2012   CHOLHDL 4 12/29/2011   Lab Results  Component Value Date   LDLDIRECT 99.9 06/23/2011   overall fairly controlled - does need to get HDL up  Review of Systems Review of Systems  Constitutional: Negative for fever, appetite change, fatigue and unexpected weight change.  Eyes: Negative for pain and visual disturbance.  Respiratory: Negative for cough and shortness of breath.   Cardiovascular: Negative for cp or palpitations    Gastrointestinal: Negative for nausea, diarrhea and constipation.  Genitourinary: Negative for urgency and frequency.  Skin: Negative for pallor or rash   Neurological: Negative for weakness, light-headedness, numbness and headaches.  Hematological: Negative for adenopathy. Does not bruise/bleed easily.  Psychiatric/Behavioral: Negative for dysphoric mood. The patient is  not nervous/anxious.         Objective:   Physical Exam  Constitutional: He appears well-developed and well-nourished. No distress.  obese and well appearing   HENT:  Head: Normocephalic and atraumatic.  Right Ear: External ear normal.  Left Ear: External ear normal.  Nose: Nose normal.  Mouth/Throat: Oropharynx is clear and moist.  Eyes: Conjunctivae and EOM are normal. Pupils are equal, round, and reactive to light. Right eye exhibits no discharge. Left eye exhibits no discharge. No scleral icterus.  Neck: Normal range of motion. Neck supple. No JVD present. Carotid bruit is not present. No thyromegaly present.  Cardiovascular: Normal rate, regular rhythm, normal heart sounds and intact distal pulses.  Exam reveals no gallop.   Pulmonary/Chest: Effort normal and breath sounds normal. No respiratory distress. He has no wheezes. He exhibits no tenderness.  Abdominal: Soft. Bowel sounds are normal. He exhibits no distension, no abdominal bruit and no mass. There is no tenderness.  Musculoskeletal: He exhibits no edema and no tenderness.  Lymphadenopathy:    He has no cervical adenopathy.  Neurological: He is alert. He has normal reflexes. No cranial nerve deficit. He exhibits normal muscle tone. Coordination normal.  Skin: Skin is warm and dry. No rash noted. No erythema. No pallor.  Several broken toenails on R foot  Psychiatric: He has a  normal mood and affect.          Assessment & Plan:

## 2013-08-28 NOTE — Assessment & Plan Note (Signed)
Lab Results  Component Value Date   HGBA1C 6.4 08/21/2013   Disc risk of DM  Disc need for wt loss  Enc further exercise with dec sugar/ calories  F/u planned

## 2013-08-28 NOTE — Assessment & Plan Note (Signed)
Disc goals for lipids and reasons to control them Rev labs with pt Rev low sat fat diet in detail Will work on raising HDL with fish oil and exercise

## 2013-08-28 NOTE — Patient Instructions (Signed)
Cut sugar out of diet and cut portions by 1/4/ to 1/3 for weight loss Keep exercising  Drink water and avoid other beverages   Get back on fish oil  Follow up in 6 months for annual exam with labs prior

## 2013-08-28 NOTE — Assessment & Plan Note (Signed)
Discussed how this problem influences overall health and the risks it imposes  Reviewed plan for weight loss with lower calorie diet (via better food choices and also portion control or program like weight watchers) and exercise building up to or more than 30 minutes 5 days per week including some aerobic activity    

## 2013-08-30 ENCOUNTER — Telehealth: Payer: Self-pay | Admitting: Family Medicine

## 2013-08-30 NOTE — Telephone Encounter (Signed)
Relevant patient education assigned to patient using Emmi. ° °

## 2014-02-19 ENCOUNTER — Telehealth: Payer: Self-pay | Admitting: Family Medicine

## 2014-02-19 DIAGNOSIS — R739 Hyperglycemia, unspecified: Secondary | ICD-10-CM

## 2014-02-19 DIAGNOSIS — R972 Elevated prostate specific antigen [PSA]: Secondary | ICD-10-CM

## 2014-02-19 DIAGNOSIS — Z0000 Encounter for general adult medical examination without abnormal findings: Secondary | ICD-10-CM

## 2014-02-19 DIAGNOSIS — Z87898 Personal history of other specified conditions: Secondary | ICD-10-CM

## 2014-02-19 NOTE — Telephone Encounter (Signed)
Message copied by Abner Greenspan on Mon Feb 19, 2014  3:39 PM ------      Message from: Ellamae Sia      Created: Mon Feb 12, 2014 11:38 AM      Regarding: Lab orders for Tuesday, 8.11.15       Patient is scheduled for CPX labs, please order future labs, Thanks , Terri       ------

## 2014-02-20 ENCOUNTER — Other Ambulatory Visit (INDEPENDENT_AMBULATORY_CARE_PROVIDER_SITE_OTHER): Payer: BC Managed Care – PPO

## 2014-02-20 DIAGNOSIS — Z0000 Encounter for general adult medical examination without abnormal findings: Secondary | ICD-10-CM

## 2014-02-20 DIAGNOSIS — R739 Hyperglycemia, unspecified: Secondary | ICD-10-CM

## 2014-02-20 DIAGNOSIS — Z87898 Personal history of other specified conditions: Secondary | ICD-10-CM

## 2014-02-20 DIAGNOSIS — R7309 Other abnormal glucose: Secondary | ICD-10-CM

## 2014-02-20 LAB — COMPREHENSIVE METABOLIC PANEL
ALK PHOS: 73 U/L (ref 39–117)
ALT: 37 U/L (ref 0–53)
AST: 24 U/L (ref 0–37)
Albumin: 4 g/dL (ref 3.5–5.2)
BUN: 19 mg/dL (ref 6–23)
CALCIUM: 9.6 mg/dL (ref 8.4–10.5)
CO2: 26 meq/L (ref 19–32)
Chloride: 103 mEq/L (ref 96–112)
Creatinine, Ser: 1 mg/dL (ref 0.4–1.5)
GFR: 79.22 mL/min (ref >60.00)
Glucose, Bld: 120 mg/dL — ABNORMAL HIGH (ref 70–99)
POTASSIUM: 4 meq/L (ref 3.5–5.1)
SODIUM: 139 meq/L (ref 135–145)
Total Bilirubin: 1.9 mg/dL — ABNORMAL HIGH (ref 0.2–1.2)
Total Protein: 6.9 g/dL (ref 6.0–8.3)

## 2014-02-20 LAB — CBC WITH DIFFERENTIAL/PLATELET
Basophils Absolute: 0 10*3/uL (ref 0.0–0.1)
Basophils Relative: 0.5 % (ref 0.0–3.0)
EOS ABS: 0 10*3/uL (ref 0.0–0.7)
EOS PCT: 0.7 % (ref 0.0–5.0)
HCT: 44.7 % (ref 39.0–52.0)
HEMOGLOBIN: 15.1 g/dL (ref 13.0–17.0)
LYMPHS PCT: 33.5 % (ref 12.0–46.0)
Lymphs Abs: 2.1 10*3/uL (ref 0.7–4.0)
MCHC: 33.7 g/dL (ref 30.0–36.0)
MCV: 89.5 fl (ref 78.0–100.0)
Monocytes Absolute: 0.6 10*3/uL (ref 0.1–1.0)
Monocytes Relative: 10 % (ref 3.0–12.0)
NEUTROS ABS: 3.5 10*3/uL (ref 1.4–7.7)
NEUTROS PCT: 55.3 % (ref 43.0–77.0)
Platelets: 170 10*3/uL (ref 150.0–400.0)
RBC: 5 Mil/uL (ref 4.22–5.81)
RDW: 13.6 % (ref 11.5–15.5)
WBC: 6.4 10*3/uL (ref 4.0–10.5)

## 2014-02-20 LAB — LIPID PANEL
CHOL/HDL RATIO: 4
Cholesterol: 164 mg/dL (ref 0–200)
HDL: 38.2 mg/dL — ABNORMAL LOW (ref >39.00)
LDL CALC: 105 mg/dL — AB (ref 0–99)
NONHDL: 125.8
TRIGLYCERIDES: 105 mg/dL (ref 0.0–149.0)
VLDL: 21 mg/dL (ref 0.0–40.0)

## 2014-02-20 LAB — PSA: PSA: 5.32 ng/mL — ABNORMAL HIGH (ref 0.10–4.00)

## 2014-02-20 LAB — HEMOGLOBIN A1C: HEMOGLOBIN A1C: 6.6 % — AB (ref 4.6–6.5)

## 2014-02-20 LAB — TSH: TSH: 2.11 u[IU]/mL (ref 0.35–4.50)

## 2014-02-27 ENCOUNTER — Ambulatory Visit (INDEPENDENT_AMBULATORY_CARE_PROVIDER_SITE_OTHER): Payer: BC Managed Care – PPO | Admitting: Family Medicine

## 2014-02-27 ENCOUNTER — Encounter: Payer: Self-pay | Admitting: Family Medicine

## 2014-02-27 VITALS — BP 136/84 | HR 82 | Temp 98.2°F | Ht 68.25 in | Wt 242.5 lb

## 2014-02-27 DIAGNOSIS — E786 Lipoprotein deficiency: Secondary | ICD-10-CM

## 2014-02-27 DIAGNOSIS — R739 Hyperglycemia, unspecified: Secondary | ICD-10-CM

## 2014-02-27 DIAGNOSIS — R7309 Other abnormal glucose: Secondary | ICD-10-CM

## 2014-02-27 DIAGNOSIS — E7800 Pure hypercholesterolemia, unspecified: Secondary | ICD-10-CM

## 2014-02-27 DIAGNOSIS — Z0000 Encounter for general adult medical examination without abnormal findings: Secondary | ICD-10-CM

## 2014-02-27 DIAGNOSIS — Z87898 Personal history of other specified conditions: Secondary | ICD-10-CM

## 2014-02-27 DIAGNOSIS — I10 Essential (primary) hypertension: Secondary | ICD-10-CM

## 2014-02-27 DIAGNOSIS — E669 Obesity, unspecified: Secondary | ICD-10-CM

## 2014-02-27 NOTE — Progress Notes (Signed)
Subjective:    Patient ID: Daniel Lee, male    DOB: 1955-09-18, 58 y.o.   MRN: [id number]  HPI Here for health maintenance exam and to review chronic medical problems    Wt is down 11 lb with bmi of 54 Happy about that -is working hard on weight loss  Eating better healthy diet- more lean chicken and vegetable  Obese-knows he needs to loose Walking for exercise - then he stopped due to hot weather and longer work days  He has an elliptical - it really makes him sore   Flu vaccine 10/14- will get it at work  Td 6/07 colonosc 11/13 with hyperplastic polyps  Hx of hyperbilirubinemia  Bili was 1.9- overall stable   Hx of hyperglycemia Lab Results  Component Value Date   HGBA1C 6.6* 02/20/2014   this is up from 6.4 Thinks this will improve with current wt loss and better diet  Is trying to get off sweets (but was bad on vacation)    Hx of BPH and elevated psa Lab Results  Component Value Date   PSA 5.32* 02/20/2014   PSA 4.60* 06/23/2011   PSA urologist 11/10/2005  he has not gone for the past year - his doctor moved  His BPH symptoms come and go - nocturia depends on how much fluid he drinks Is drinking more water -urinating more  Stream has slowed with age     bp is stable today  No cp or palpitations or headaches or edema  No side effects to medicines  When he checks it at work - he is not at rest  BP Readings from Last 3 Encounters:  02/27/14 136/84  08/28/13 140/86  04/26/13 132/88     Hx of hyperlipidemia Also low HDL Lab Results  Component Value Date   CHOL 164 02/20/2014   CHOL 149 08/21/2013   CHOL 167 06/28/2012   Lab Results  Component Value Date   HDL 38.20* 02/20/2014   HDL 34.70* 08/21/2013   HDL 33.40* 06/28/2012   Lab Results  Component Value Date   LDLCALC 105* 02/20/2014   LDLCALC 87 08/21/2013   LDLCALC 97 06/28/2012   Lab Results  Component Value Date   TRIG 105.0 02/20/2014   TRIG 137.0 08/21/2013   TRIG 181.0* 06/28/2012   Lab  Results  Component Value Date   CHOLHDL 4 02/20/2014   CHOLHDL 4 08/21/2013   CHOLHDL 5 06/28/2012   Lab Results  Component Value Date   LDLDIRECT 99.9 06/23/2011  is close to goal for HDL - will exercise more and get back on fish oil   Patient Active Problem List   Diagnosis Date Noted  . Obesity 08/28/2013  . Hyperbilirubinemia 07/01/2012  . Routine general medical examination at a health care facility 06/22/2011  . ESSENTIAL HYPERTENSION, BENIGN 04/25/2009  . PURE HYPERCHOLESTEROLEMIA 07/19/2008  . Hyperglycemia 04/16/2008  . OBESITY, UNSPECIFIED 04/16/2008  . LOW HDL 03/18/2007  . ERECTILE DYSFUNCTION 10/07/2006  . PROSTATE SPECIFIC ANTIGEN, ELEVATED 10/07/2006  . BENIGN PROSTATIC HYPERTROPHY, HX OF 10/07/2006   Past Medical History  Diagnosis Date  . Hypertension   . BPH (benign prostatic hypertrophy)   . ED (erectile dysfunction)   . Hyperglycemia   . Elevated alanine aminotransferase (ALT) level     suspect fatty liver   Past Surgical History  Procedure Laterality Date  . Prostate surgery  2003    prostate biopsy negative // urologist  . Abd ultrasound  11/2005    negative  History  Substance Use Topics  . Smoking status: Former Research scientist (life sciences)  . Smokeless tobacco: Not on file  . Alcohol Use: No     Comment: rarely   Family History  Problem Relation Age of Onset  . Hypertension Mother   . Hyperlipidemia Mother   . Cancer Father     prostate cancer   . Diabetes Father   . Stroke Father   . Heart disease Brother   . Diabetes Brother   . Kidney disease Brother   . Cancer Paternal Uncle     prostate cancer   No Known Allergies Current Outpatient Prescriptions on File Prior to Visit  Medication Sig Dispense Refill  . aspirin 81 MG EC tablet Take 81 mg by mouth daily.       . finasteride (PROSCAR) 5 MG tablet Take 1 tablet by mouth daily.      Marland Kitchen glucose blood (ONE TOUCH ULTRA TEST) test strip Check blood sugar twice daily or as directed  300 each  1  .  losartan-hydrochlorothiazide (HYZAAR) 100-25 MG per tablet Take 1 tablet by mouth daily.  90 tablet  3  . ONETOUCH DELICA LANCETS MISC To check sugar once daily and as needed for hyperglycemia  100 each  3   No current facility-administered medications on file prior to visit.     Review of Systems    Review of Systems  Constitutional: Negative for fever, appetite change, fatigue and unexpected weight change.  Eyes: Negative for pain and visual disturbance.  Respiratory: Negative for cough and shortness of breath.   Cardiovascular: Negative for cp or palpitations    Gastrointestinal: Negative for nausea, diarrhea and constipation.  Genitourinary: Negative for urgency and frequency. pos for nocturia  Skin: Negative for pallor or rash   Neurological: Negative for weakness, light-headedness, numbness and headaches.  Hematological: Negative for adenopathy. Does not bruise/bleed easily.  Psychiatric/Behavioral: Negative for dysphoric mood. The patient is not nervous/anxious.      Objective:   Physical Exam  Constitutional: He appears well-developed and well-nourished. No distress.  obese and well appearing   HENT:  Head: Normocephalic and atraumatic.  Right Ear: External ear normal.  Left Ear: External ear normal.  Nose: Nose normal.  Mouth/Throat: Oropharynx is clear and moist.  Eyes: Conjunctivae and EOM are normal. Pupils are equal, round, and reactive to light. Right eye exhibits no discharge. Left eye exhibits no discharge. No scleral icterus.  Neck: Normal range of motion. Neck supple. No JVD present. Carotid bruit is not present. No thyromegaly present.  Cardiovascular: Normal rate, regular rhythm, normal heart sounds and intact distal pulses.  Exam reveals no gallop.   Pulmonary/Chest: Effort normal and breath sounds normal. No respiratory distress. He has no wheezes. He exhibits no tenderness.  Abdominal: Soft. Bowel sounds are normal. He exhibits no distension, no abdominal  bruit and no mass. There is no tenderness.  Genitourinary:  GU exam def for upcoming urology visit   Musculoskeletal: He exhibits no edema and no tenderness.  Lymphadenopathy:    He has no cervical adenopathy.  Neurological: He is alert. He has normal reflexes. No cranial nerve deficit. He exhibits normal muscle tone. Coordination normal.  Skin: Skin is warm and dry. No rash noted. No erythema. No pallor.  Psychiatric: He has a normal mood and affect.          Assessment & Plan:   Problem List Items Addressed This Visit     Cardiovascular and Mediastinum   ESSENTIAL HYPERTENSION, BENIGN -  Primary      bp in fair control at this time  BP Readings from Last 1 Encounters:  02/27/14 136/84   No changes needed Disc lifstyle change with low sodium diet and exercise  Given info on how to check bp at home (at rest) Will disc further at 3 mo f/u      Other   Hyperglycemia      Lab Results  Component Value Date   HGBA1C 6.6* 02/20/2014   Up a bit  Hope with better diet/exercise and wt loss it will improve Lab and f/u in 3 mo      Relevant Orders      Hemoglobin A1c   PURE HYPERCHOLESTEROLEMIA     LDL is just above 100 Disc goals Disc goals for lipids and reasons to control them Rev labs with pt Rev low sat fat diet in detail     LOW HDL     Improving Goal of 40 or above He will step up the exercise and get back on fish oil    BENIGN PROSTATIC HYPERTROPHY, HX OF      Ongoing with slowly rising psa Lab Results  Component Value Date   PSA 5.32* 02/20/2014   PSA 4.60* 06/23/2011   PSA urologist 11/10/2005   Pt will call to get appt with his urologist - in [facility]- (will call if he needs a referral)    Routine general medical examination at a health care facility     Reviewed health habits including diet and exercise and skin cancer prevention Reviewed appropriate screening tests for age  Also reviewed health mt list, fam hx and immunization status , as well as  social and family history   See HPI Labs reviewed       Obesity     Discussed how this problem influences overall health and the risks it imposes  Reviewed plan for weight loss with lower calorie diet (via better food choices and also portion control or program like weight watchers) and exercise building up to or more than 30 minutes 5 days per week including some aerobic activity    Commended on the success so far

## 2014-02-27 NOTE — Assessment & Plan Note (Signed)
Improving Goal of 40 or above He will step up the exercise and get back on fish oil

## 2014-02-27 NOTE — Assessment & Plan Note (Signed)
bp in fair control at this time  BP Readings from Last 1 Encounters:  02/27/14 136/84   No changes needed Disc lifstyle change with low sodium diet and exercise  Given info on how to check bp at home (at rest) Will disc further at 3 mo f/u

## 2014-02-27 NOTE — Assessment & Plan Note (Signed)
Ongoing with slowly rising psa Lab Results  Component Value Date   PSA 5.32* 02/20/2014   PSA 4.60* 06/23/2011   PSA urologist 11/10/2005   Pt will call to get appt with his urologist - in [facility]- (will call if he needs a referral)

## 2014-02-27 NOTE — Assessment & Plan Note (Signed)
Reviewed health habits including diet and exercise and skin cancer prevention Reviewed appropriate screening tests for age  Also reviewed health mt list, fam hx and immunization status , as well as social and family history   See HPI Labs reviewed  

## 2014-02-27 NOTE — Assessment & Plan Note (Signed)
LDL is just above 100 Disc goals Disc goals for lipids and reasons to control them Rev labs with pt Rev low sat fat diet in detail

## 2014-02-27 NOTE — Progress Notes (Signed)
Pre visit review using our clinic review tool, if applicable. No additional management support is needed unless otherwise documented below in the visit note. 

## 2014-02-27 NOTE — Assessment & Plan Note (Signed)
Lab Results  Component Value Date   HGBA1C 6.6* 02/20/2014   Up a bit  Hope with better diet/exercise and wt loss it will improve Lab and f/u in 3 mo

## 2014-02-27 NOTE — Patient Instructions (Addendum)
Make sure to get an appt to see your urologist in [facility]- your PSA is up a bit more  Use your elliptical every other day (when it is too hot to exercise outside) Don't forget to get a flu shot in the fall Schedule labs and then follow up in 3 months (we will check your A1C) Keep up the good job with weight loss Try to stick to a diabetic diet    How to Take Your Blood Pressure HOW DO I GET A BLOOD PRESSURE MACHINE?  You can buy an electronic home blood pressure machine at your local pharmacy. Insurance will sometimes cover the cost if you have a prescription.  Ask your doctor what type of machine is best for you. There are different machines for your arm and your wrist.  If you decide to buy a machine to check your blood pressure on your arm, first check the size of your arm so you can buy the right size cuff. To check the size of your arm:   Use a measuring tape that shows both inches and centimeters.   Wrap the measuring tape around the upper-middle part of your arm. You may need someone to help you measure.   Write down your arm measurement in both inches and centimeters.   To measure your blood pressure correctly, it is important to have the right size cuff.   If your arm is up to 13 inches (up to 34 centimeters), get an adult cuff size.  If your arm is 13 to 17 inches (35 to 44 centimeters), get a large adult cuff size.    If your arm is 17 to 20 inches (45 to 52 centimeters), get an adult thigh cuff.  WHAT DO THE NUMBERS MEAN?   There are two numbers that make up your blood pressure. For example: 120/80.  The first number (120 in our example) is called the "systolic pressure." It is a measure of the pressure in your blood vessels when your heart is pumping blood.  The second number (80 in our example) is called the "diastolic pressure." It is a measure of the pressure in your blood vessels when your heart is resting between beats.  Your doctor will tell you what  your blood pressure should be. WHAT SHOULD I DO BEFORE I CHECK MY BLOOD PRESSURE?   Try to rest or relax for at least 30 minutes before you check your blood pressure.  Do not smoke.  Do not have any drinks with caffeine, such as:  Soda.  Coffee.  Tea.  Check your blood pressure in a quiet room.  Sit down and stretch out your arm on a table. Keep your arm at about the level of your heart. Let your arm relax.  Make sure that your legs are not crossed. HOW DO I CHECK MY BLOOD PRESSURE?  Follow the directions that came with your machine.  Make sure you remove any tight-fighting clothing from your arm or wrist. Wrap the cuff around your upper arm or wrist. You should be able to fit a finger between the cuff and your arm. If you cannot fit a finger between the cuff and your arm, it is too tight and should be removed and rewrapped.  Some units require you to manually pump up the arm cuff.  Automatic units inflate the cuff when you press a button.  Cuff deflation is automatic in both models.  After the cuff is inflated, the unit measures your blood pressure and pulse.  The readings are shown on a monitor. Hold still and breathe normally while the cuff is inflated.  Getting a reading takes less than a minute.  Some models store readings in a memory. Some provide a printout of readings. If your machine does not store your readings, keep a written record.  Take readings with you to your next visit with your doctor. Document Released: 06/11/2008 Document Revised: 11/13/2013 Document Reviewed: 08/24/2013 [facility] Patient Information 2015 [place], [place]. This information is not intended to replace advice given to you by your health care provider. Make sure you discuss any questions you have with your health care provider.

## 2014-02-27 NOTE — Assessment & Plan Note (Signed)
Discussed how this problem influences overall health and the risks it imposes  Reviewed plan for weight loss with lower calorie diet (via better food choices and also portion control or program like weight watchers) and exercise building up to or more than 30 minutes 5 days per week including some aerobic activity    Commended on the success so far

## 2014-05-25 ENCOUNTER — Other Ambulatory Visit (INDEPENDENT_AMBULATORY_CARE_PROVIDER_SITE_OTHER): Payer: BC Managed Care – PPO

## 2014-05-25 DIAGNOSIS — R739 Hyperglycemia, unspecified: Secondary | ICD-10-CM

## 2014-05-25 LAB — HEMOGLOBIN A1C: Hgb A1c MFr Bld: 6.4 % (ref 4.6–6.5)

## 2014-06-01 ENCOUNTER — Ambulatory Visit (INDEPENDENT_AMBULATORY_CARE_PROVIDER_SITE_OTHER): Payer: BC Managed Care – PPO | Admitting: Family Medicine

## 2014-06-01 ENCOUNTER — Encounter: Payer: Self-pay | Admitting: Family Medicine

## 2014-06-01 VITALS — BP 138/85 | HR 77 | Temp 98.2°F | Ht 68.25 in | Wt 244.5 lb

## 2014-06-01 DIAGNOSIS — R739 Hyperglycemia, unspecified: Secondary | ICD-10-CM

## 2014-06-01 DIAGNOSIS — I10 Essential (primary) hypertension: Secondary | ICD-10-CM

## 2014-06-01 MED ORDER — LOSARTAN POTASSIUM-HCTZ 100-25 MG PO TABS: 1.0000 | ORAL_TABLET | Freq: Every day | ORAL | Status: DC

## 2014-06-01 NOTE — Progress Notes (Signed)
Subjective:    Patient ID: Daniel Lee, male    DOB: Jan 03, 1956, 58 y.o.   MRN: [id number]  HPI Here for f/u of hyperglycemia  Last visit-disc changing diet  Has been working out of town and does not have time to eat properly  A1C is 6.4 down 6.6    He generally tries to choose better options  He does choose chik filet -grilled chicken  Some subway - eats the whole bun however   When out of town -not exercising  Now plans to    bp is up a bit  today ( takes bp med at night now)  No cp or palpitations or headaches or edema  No side effects to medicines  BP Readings from Last 3 Encounters:  06/01/14 144/90  02/27/14 136/84  08/28/13 140/86    Needs refill for bp med  Re check 138/85  Wt is up 2 lb with bmi of 36    Patient Active Problem List   Diagnosis Date Noted  . Obesity 08/28/2013  . Hyperbilirubinemia 07/01/2012  . Routine general medical examination at a health care facility 06/22/2011  . ESSENTIAL HYPERTENSION, BENIGN 04/25/2009  . PURE HYPERCHOLESTEROLEMIA 07/19/2008  . Hyperglycemia 04/16/2008  . OBESITY, UNSPECIFIED 04/16/2008  . LOW HDL 03/18/2007  . ERECTILE DYSFUNCTION 10/07/2006  . PROSTATE SPECIFIC ANTIGEN, ELEVATED 10/07/2006  . BENIGN PROSTATIC HYPERTROPHY, HX OF 10/07/2006   Past Medical History  Diagnosis Date  . Hypertension   . BPH (benign prostatic hypertrophy)   . ED (erectile dysfunction)   . Hyperglycemia   . Elevated alanine aminotransferase (ALT) level     suspect fatty liver   Past Surgical History  Procedure Laterality Date  . Prostate surgery  2003    prostate biopsy negative // urologist  . Abd ultrasound  11/2005    negative   History  Substance Use Topics  . Smoking status: Former Research scientist (life sciences)  . Smokeless tobacco: Not on file  . Alcohol Use: No     Comment: rarely   Family History  Problem Relation Age of Onset  . Hypertension Mother   . Hyperlipidemia Mother   . Cancer Father     prostate cancer   .  Diabetes Father   . Stroke Father   . Heart disease Brother   . Diabetes Brother   . Kidney disease Brother   . Cancer Paternal Uncle     prostate cancer   No Known Allergies Current Outpatient Prescriptions on File Prior to Visit  Medication Sig Dispense Refill  . aspirin 81 MG EC tablet Take 81 mg by mouth daily.     . finasteride (PROSCAR) 5 MG tablet Take 1 tablet by mouth daily.    Marland Kitchen glucose blood (ONE TOUCH ULTRA TEST) test strip Check blood sugar twice daily or as directed 300 each 1  . ONETOUCH DELICA LANCETS MISC To check sugar once daily and as needed for hyperglycemia 100 each 3   No current facility-administered medications on file prior to visit.    Review of Systems    Review of Systems  Constitutional: Negative for fever, appetite change,  and unexpected weight change. pos for fatigue from work schedule  Eyes: Negative for pain and visual disturbance.  Respiratory: Negative for cough and shortness of breath.   Cardiovascular: Negative for cp or palpitations    Gastrointestinal: Negative for nausea, diarrhea and constipation.  Genitourinary: Negative for urgency and frequency.  Skin: Negative for pallor or rash   Neurological: Negative  for weakness, light-headedness, numbness and headaches.  Hematological: Negative for adenopathy. Does not bruise/bleed easily.  Psychiatric/Behavioral: Negative for dysphoric mood. The patient is not nervous/anxious.      Objective:   Physical Exam  Constitutional: He appears well-developed and well-nourished. No distress.  obese and well appearing   HENT:  Head: Normocephalic and atraumatic.  Mouth/Throat: Oropharynx is clear and moist.  Eyes: Conjunctivae and EOM are normal. Pupils are equal, round, and reactive to light. No scleral icterus.  Neck: Normal range of motion. Neck supple. No JVD present. Carotid bruit is not present. No thyromegaly present.  Cardiovascular: Normal rate, regular rhythm and normal heart sounds.     Pulmonary/Chest: Effort normal and breath sounds normal. No respiratory distress. He has no wheezes. He has no rales.  Musculoskeletal: He exhibits no edema.  Lymphadenopathy:    He has no cervical adenopathy.  Neurological: He is alert. He has normal reflexes. No cranial nerve deficit. He exhibits normal muscle tone. Coordination normal.  Skin: Skin is warm and dry. No erythema. No pallor.  Psychiatric: He has a normal mood and affect.          Assessment & Plan:   Problem List Items Addressed This Visit      Cardiovascular and Mediastinum   ESSENTIAL HYPERTENSION, BENIGN - Primary    BP: 138/85 mmHg  Elevated a bit  Will begin exercise program  Continue current med  F/u 3 mo    Relevant Medications      losartan-hydrochlorothiazide (HYZAAR) 100-25 MG per tablet     Other   Hyperglycemia    Lab Results  Component Value Date   HGBA1C 6.4 05/25/2014   slt improvement Urged to eat low glycemic diet/exercise /work on wt loss No longer traveling for work so that should be do able   F/u 3 mo with lab prior

## 2014-06-01 NOTE — Patient Instructions (Signed)
A good brand of blood pressure cuff to get is OMRON for arm (cuff size large)  Check blood pressure when relaxed  Work up to 30 minutes of exercise 5 days per week and eat better  Follow up in 3 months with labs prior

## 2014-06-01 NOTE — Assessment & Plan Note (Signed)
BP: 138/85 mmHg  Elevated a bit  Will begin exercise program  Continue current med  F/u 3 mo

## 2014-06-01 NOTE — Progress Notes (Signed)
Pre visit review using our clinic review tool, if applicable. No additional management support is needed unless otherwise documented below in the visit note. 

## 2014-06-01 NOTE — Assessment & Plan Note (Signed)
Lab Results  Component Value Date   HGBA1C 6.4 05/25/2014   slt improvement Urged to eat low glycemic diet/exercise /work on wt loss No longer traveling for work so that should be do able   F/u 3 mo with lab prior

## 2014-08-27 ENCOUNTER — Other Ambulatory Visit: Payer: BC Managed Care – PPO

## 2014-09-03 ENCOUNTER — Ambulatory Visit: Payer: BC Managed Care – PPO | Admitting: Family Medicine

## 2014-09-13 ENCOUNTER — Telehealth: Payer: Self-pay | Admitting: Family Medicine

## 2014-09-13 DIAGNOSIS — E7800 Pure hypercholesterolemia, unspecified: Secondary | ICD-10-CM

## 2014-09-13 DIAGNOSIS — R739 Hyperglycemia, unspecified: Secondary | ICD-10-CM

## 2014-09-13 NOTE — Telephone Encounter (Signed)
-----   Message from Ellamae Sia sent at 09/05/2014 11:56 AM EST ----- Regarding: Lab orders for Friday, 3.4.16 3 month f/u labs

## 2014-09-14 ENCOUNTER — Other Ambulatory Visit (INDEPENDENT_AMBULATORY_CARE_PROVIDER_SITE_OTHER): Payer: BLUE CROSS/BLUE SHIELD

## 2014-09-14 DIAGNOSIS — E7800 Pure hypercholesterolemia, unspecified: Secondary | ICD-10-CM

## 2014-09-14 DIAGNOSIS — R739 Hyperglycemia, unspecified: Secondary | ICD-10-CM

## 2014-09-14 LAB — LIPID PANEL
CHOL/HDL RATIO: 4
Cholesterol: 154 mg/dL (ref 0–200)
HDL: 40 mg/dL (ref >39.00)
LDL CALC: 87 mg/dL (ref 0–99)
NonHDL: 114
Triglycerides: 134 mg/dL (ref 0.0–149.0)
VLDL: 26.8 mg/dL (ref 0.0–40.0)

## 2014-09-14 LAB — HEMOGLOBIN A1C: Hgb A1c MFr Bld: 6.9 % — ABNORMAL HIGH (ref 4.6–6.5)

## 2014-09-21 ENCOUNTER — Encounter: Payer: Self-pay | Admitting: Family Medicine

## 2014-09-21 ENCOUNTER — Ambulatory Visit (INDEPENDENT_AMBULATORY_CARE_PROVIDER_SITE_OTHER): Payer: BLUE CROSS/BLUE SHIELD | Admitting: Family Medicine

## 2014-09-21 VITALS — BP 130/85 | HR 75 | Temp 98.1°F | Ht 68.25 in | Wt 240.8 lb

## 2014-09-21 DIAGNOSIS — E7800 Pure hypercholesterolemia, unspecified: Secondary | ICD-10-CM

## 2014-09-21 DIAGNOSIS — E119 Type 2 diabetes mellitus without complications: Secondary | ICD-10-CM

## 2014-09-21 DIAGNOSIS — I10 Essential (primary) hypertension: Secondary | ICD-10-CM

## 2014-09-21 DIAGNOSIS — E669 Obesity, unspecified: Secondary | ICD-10-CM

## 2014-09-21 MED ORDER — METFORMIN HCL 500 MG PO TABS: 500.0000 mg | ORAL_TABLET | Freq: Two times a day (BID) | ORAL | Status: DC

## 2014-09-21 NOTE — Assessment & Plan Note (Signed)
Lab Results  Component Value Date   HGBA1C 6.9* 09/14/2014   Will add metformin 500 bid as tol-disc poss side eff in detail  Rev DM diet  Will cut portions  Exercise 5 d per week  Wt loss enc  F/u 3 mo with lab prior

## 2014-09-21 NOTE — Assessment & Plan Note (Signed)
Disc goals for lipids and reasons to control them Rev labs with pt Rev low sat fat diet in detail Is at goal and expect HDL to go up with exercise

## 2014-09-21 NOTE — Progress Notes (Signed)
Pre visit review using our clinic review tool, if applicable. No additional management support is needed unless otherwise documented below in the visit note. 

## 2014-09-21 NOTE — Assessment & Plan Note (Signed)
bp in fair control at this time -but not quite at goal  Exercise and wt loss should help  BP Readings from Last 1 Encounters:  09/21/14 130/85   Continue hyzaar  Disc lifstyle change with low sodium diet and exercise   If not imp in 3 mo will add tx

## 2014-09-21 NOTE — Patient Instructions (Signed)
Start metformin 500 mg twice daily with breakfast and evening meal  No other changes Aim to work up to exercise at least 30 minutes 5 days per week  Don't miss meals Get back to a diabetic diet  Weight loss will help  Check blood when relaxed occasionally and keep a record   Follow up in 3 months with labs prior

## 2014-09-21 NOTE — Assessment & Plan Note (Signed)
Discussed how this problem influences overall health and the risks it imposes  Reviewed plan for weight loss with lower calorie diet (via better food choices and also portion control or program like weight watchers) and exercise building up to or more than 30 minutes 5 days per week including some aerobic activity   Will begin working harder on that

## 2014-09-21 NOTE — Progress Notes (Signed)
Subjective:    Patient ID: Daniel Lee, male    DOB: [birth year], 59 y.o.   MRN: [id number]  HPI Here for f/u of chronic health problems   Feeling great  Trying to take care of himself    bp is stable today  No cp or palpitations or headaches or edema  No side effects to medicines -takes his med at night  He got a cuff - checked it at home 130s/80-90 BP Readings from Last 3 Encounters:  09/21/14 132/90  06/01/14 138/85  02/27/14 136/84      Hyperglycemia Lab Results  Component Value Date   HGBA1C 6.9* 09/14/2014   Up from 6.4 Ate more sugar and carbs over the holidays  Also portions are too big  He eats a lot of chik filet- eats salads with grilled chicken  Will stop sweets  Is drinking water  occ diet coke (no sugar drinks) , no juice either  He still has info from diabetic teaching    Wt is down 4 lb  bmi 36 Obese  He is ready to be more active -also off on fridays  Will start walking -regularly    Hyperlipidemia Lab Results  Component Value Date   CHOL 154 09/14/2014   HDL 40.00 09/14/2014   LDLCALC 87 09/14/2014   LDLDIRECT 99.9 06/23/2011   TRIG 134.0 09/14/2014   CHOLHDL 4 09/14/2014   diet controlled  It is at goal - but will work on exercise to inc HDL   Patient Active Problem List   Diagnosis Date Noted  . Obesity 08/28/2013  . Hyperbilirubinemia 07/01/2012  . Routine general medical examination at a health care facility 06/22/2011  . ESSENTIAL HYPERTENSION, BENIGN 04/25/2009  . PURE HYPERCHOLESTEROLEMIA 07/19/2008  . Diabetes type 2, controlled 04/16/2008  . OBESITY, UNSPECIFIED 04/16/2008  . LOW HDL 03/18/2007  . ERECTILE DYSFUNCTION 10/07/2006  . PROSTATE SPECIFIC ANTIGEN, ELEVATED 10/07/2006  . BENIGN PROSTATIC HYPERTROPHY, HX OF 10/07/2006   Past Medical History  Diagnosis Date  . Hypertension   . BPH (benign prostatic hypertrophy)   . ED (erectile dysfunction)   . Hyperglycemia   . Elevated alanine aminotransferase (ALT)  level     suspect fatty liver   Past Surgical History  Procedure Laterality Date  . Prostate surgery  2003    prostate biopsy negative // urologist  . Abd ultrasound  11/2005    negative   History  Substance Use Topics  . Smoking status: Former Research scientist (life sciences)  . Smokeless tobacco: Not on file  . Alcohol Use: No     Comment: rarely   Family History  Problem Relation Age of Onset  . Hypertension Mother   . Hyperlipidemia Mother   . Cancer Father     prostate cancer   . Diabetes Father   . Stroke Father   . Heart disease Brother   . Diabetes Brother   . Kidney disease Brother   . Cancer Paternal Uncle     prostate cancer   No Known Allergies Current Outpatient Prescriptions on File Prior to Visit  Medication Sig Dispense Refill  . aspirin 81 MG EC tablet Take 81 mg by mouth daily.     . finasteride (PROSCAR) 5 MG tablet Take 1 tablet by mouth daily.    Marland Kitchen glucose blood (ONE TOUCH ULTRA TEST) test strip Check blood sugar twice daily or as directed 300 each 1  . losartan-hydrochlorothiazide (HYZAAR) 100-25 MG per tablet Take 1 tablet by mouth daily. 90 tablet 3  No current facility-administered medications on file prior to visit.      Review of Systems Review of Systems  Constitutional: Negative for fever, appetite change, fatigue and unexpected weight change.  Eyes: Negative for pain and visual disturbance.  Respiratory: Negative for cough and shortness of breath.   Cardiovascular: Negative for cp or palpitations    Gastrointestinal: Negative for nausea, diarrhea and constipation.  Genitourinary: Negative for urgency and frequency.  Skin: Negative for pallor or rash   Neurological: Negative for weakness, light-headedness, numbness and headaches.  Hematological: Negative for adenopathy. Does not bruise/bleed easily.  Psychiatric/Behavioral: Negative for dysphoric mood. The patient is not nervous/anxious.         Objective:   Physical Exam  Constitutional: He appears  well-developed and well-nourished. No distress.  obese and well appearing   HENT:  Head: Normocephalic and atraumatic.  Mouth/Throat: Oropharynx is clear and moist.  Eyes: Conjunctivae and EOM are normal. Pupils are equal, round, and reactive to light. No scleral icterus.  Neck: Normal range of motion. Neck supple. No JVD present. Carotid bruit is not present.  Cardiovascular: Normal rate, regular rhythm, normal heart sounds and intact distal pulses.   Pulmonary/Chest: Effort normal and breath sounds normal. No respiratory distress. He has no wheezes. He has no rales.  Musculoskeletal: He exhibits no edema.  Lymphadenopathy:    He has no cervical adenopathy.  Neurological: He is alert. He has normal reflexes. No cranial nerve deficit. He exhibits normal muscle tone. Coordination normal.  Skin: Skin is warm and dry. No rash noted. No erythema. No pallor.  Psychiatric: He has a normal mood and affect.          Assessment & Plan:   Problem List Items Addressed This Visit      Cardiovascular and Mediastinum   ESSENTIAL HYPERTENSION, BENIGN - Primary    bp in fair control at this time -but not quite at goal  Exercise and wt loss should help  BP Readings from Last 1 Encounters:  09/21/14 130/85   Continue hyzaar  Disc lifstyle change with low sodium diet and exercise   If not imp in 3 mo will add tx         Endocrine   Diabetes type 2, controlled    Lab Results  Component Value Date   HGBA1C 6.9* 09/14/2014   Will add metformin 500 bid as tol-disc poss side eff in detail  Rev DM diet  Will cut portions  Exercise 5 d per week  Wt loss enc  F/u 3 mo with lab prior       Relevant Medications   metFORMIN (GLUCOPHAGE) tablet     Other   Obesity    Discussed how this problem influences overall health and the risks it imposes  Reviewed plan for weight loss with lower calorie diet (via better food choices and also portion control or program like weight watchers) and  exercise building up to or more than 30 minutes 5 days per week including some aerobic activity   Will begin working harder on that       Relevant Medications   metFORMIN (GLUCOPHAGE) tablet   PURE HYPERCHOLESTEROLEMIA    Disc goals for lipids and reasons to control them Rev labs with pt Rev low sat fat diet in detail Is at goal and expect HDL to go up with exercise

## 2014-12-20 ENCOUNTER — Telehealth: Payer: Self-pay | Admitting: Family Medicine

## 2014-12-20 ENCOUNTER — Other Ambulatory Visit: Payer: Self-pay | Admitting: Family Medicine

## 2014-12-20 DIAGNOSIS — I10 Essential (primary) hypertension: Secondary | ICD-10-CM

## 2014-12-20 DIAGNOSIS — E119 Type 2 diabetes mellitus without complications: Secondary | ICD-10-CM

## 2014-12-20 NOTE — Telephone Encounter (Signed)
-----   Message from Ellamae Sia sent at 12/12/2014  6:02 PM EDT ----- Regarding: Lab orders for Thursday, 6.9.16 Lab orders for a 3 month f/u

## 2014-12-21 ENCOUNTER — Other Ambulatory Visit (INDEPENDENT_AMBULATORY_CARE_PROVIDER_SITE_OTHER): Payer: BLUE CROSS/BLUE SHIELD

## 2014-12-21 DIAGNOSIS — I10 Essential (primary) hypertension: Secondary | ICD-10-CM

## 2014-12-21 DIAGNOSIS — E119 Type 2 diabetes mellitus without complications: Secondary | ICD-10-CM

## 2014-12-21 LAB — COMPREHENSIVE METABOLIC PANEL
ALT: 41 U/L (ref 0–53)
AST: 26 U/L (ref 0–37)
Albumin: 4.4 g/dL (ref 3.5–5.2)
Alkaline Phosphatase: 69 U/L (ref 39–117)
BILIRUBIN TOTAL: 1.8 mg/dL — AB (ref 0.2–1.2)
BUN: 18 mg/dL (ref 6–23)
CO2: 30 mEq/L (ref 19–32)
CREATININE: 0.94 mg/dL (ref 0.40–1.50)
Calcium: 9.7 mg/dL (ref 8.4–10.5)
Chloride: 103 mEq/L (ref 96–112)
GFR: 86.81 mL/min (ref >60.00)
Glucose, Bld: 122 mg/dL — ABNORMAL HIGH (ref 70–99)
Potassium: 4 mEq/L (ref 3.5–5.1)
SODIUM: 137 meq/L (ref 135–145)
Total Protein: 7 g/dL (ref 6.0–8.3)

## 2014-12-21 LAB — HEMOGLOBIN A1C: Hgb A1c MFr Bld: 5.8 % (ref 4.6–6.5)

## 2014-12-28 ENCOUNTER — Encounter: Payer: Self-pay | Admitting: Family Medicine

## 2014-12-28 ENCOUNTER — Ambulatory Visit (INDEPENDENT_AMBULATORY_CARE_PROVIDER_SITE_OTHER): Payer: BLUE CROSS/BLUE SHIELD | Admitting: Family Medicine

## 2014-12-28 VITALS — BP 122/80 | HR 72 | Temp 97.7°F | Wt 238.0 lb

## 2014-12-28 DIAGNOSIS — I10 Essential (primary) hypertension: Secondary | ICD-10-CM

## 2014-12-28 DIAGNOSIS — E119 Type 2 diabetes mellitus without complications: Secondary | ICD-10-CM | POA: Diagnosis not present

## 2014-12-28 DIAGNOSIS — E669 Obesity, unspecified: Secondary | ICD-10-CM

## 2014-12-28 MED ORDER — METFORMIN HCL 500 MG PO TABS: 500.0000 mg | ORAL_TABLET | Freq: Two times a day (BID) | ORAL | Status: DC

## 2014-12-28 MED ORDER — GLUCOSE BLOOD VI STRP: ORAL_STRIP | Status: DC

## 2014-12-28 NOTE — Assessment & Plan Note (Signed)
bp in fair control at this time  BP Readings from Last 1 Encounters:  12/28/14 122/80   No changes needed Disc lifstyle change with low sodium diet and exercise  Lab reviewed F/u 6 mo  Enc wt loss

## 2014-12-28 NOTE — Assessment & Plan Note (Signed)
Discussed how this problem influences overall health and the risks it imposes  Reviewed plan for weight loss with lower calorie diet (via better food choices and also portion control or program like weight watchers) and exercise building up to or more than 30 minutes 5 days per week including some aerobic activity    

## 2014-12-28 NOTE — Progress Notes (Signed)
Subjective:    Patient ID: Daniel Lee, male    DOB: 1955-12-27, 58 y.o.   MRN: [id number]  HPI Here for f/u of chronic medical problems   Wt is down 2 lb  bmi is 35 in obese range  bp is stable today  No cp or palpitations or headaches or edema  No side effects to medicines  BP Readings from Last 3 Encounters:  12/28/14 122/80  09/21/14 130/85  06/01/14 138/85      Diabetes Home sugar results - is out of strips , usually 118-127  DM diet - is improved  Exercise - works outside a lot and also walks 6000-8000 steps per day Symptoms- none  A1C last  Lab Results  Component Value Date   HGBA1C 5.8 12/21/2014   Down from 6.9 ! No problems with medications - on metformin now -no problems  Renal protection ARB Last eye exam - due for that    Patient Active Problem List   Diagnosis Date Noted  . Obesity 08/28/2013  . Hyperbilirubinemia 07/01/2012  . Routine general medical examination at a health care facility 06/22/2011  . ESSENTIAL HYPERTENSION, BENIGN 04/25/2009  . PURE HYPERCHOLESTEROLEMIA 07/19/2008  . Diabetes type 2, controlled 04/16/2008  . OBESITY, UNSPECIFIED 04/16/2008  . LOW HDL 03/18/2007  . ERECTILE DYSFUNCTION 10/07/2006  . PROSTATE SPECIFIC ANTIGEN, ELEVATED 10/07/2006  . BENIGN PROSTATIC HYPERTROPHY, HX OF 10/07/2006   Past Medical History  Diagnosis Date  . Hypertension   . BPH (benign prostatic hypertrophy)   . ED (erectile dysfunction)   . Hyperglycemia   . Elevated alanine aminotransferase (ALT) level     suspect fatty liver   Past Surgical History  Procedure Laterality Date  . Prostate surgery  2003    prostate biopsy negative // urologist  . Abd ultrasound  11/2005    negative   History  Substance Use Topics  . Smoking status: Former Research scientist (life sciences)  . Smokeless tobacco: Not on file  . Alcohol Use: No     Comment: rarely   Family History  Problem Relation Age of Onset  . Hypertension Mother   . Hyperlipidemia Mother   . Cancer  Father     prostate cancer   . Diabetes Father   . Stroke Father   . Heart disease Brother   . Diabetes Brother   . Kidney disease Brother   . Cancer Paternal Uncle     prostate cancer   No Known Allergies Current Outpatient Prescriptions on File Prior to Visit  Medication Sig Dispense Refill  . aspirin 81 MG EC tablet Take 81 mg by mouth daily.     . finasteride (PROSCAR) 5 MG tablet Take 1 tablet by mouth daily.    Marland Kitchen glucose blood (ONE TOUCH ULTRA TEST) test strip Check blood sugar twice daily or as directed 300 each 1  . losartan-hydrochlorothiazide (HYZAAR) 100-25 MG per tablet Take 1 tablet by mouth daily. 90 tablet 3  . metFORMIN (GLUCOPHAGE) 500 MG tablet Take 1 tablet (500 mg total) by mouth 2 (two) times daily with a meal. 60 tablet 3   No current facility-administered medications on file prior to visit.     Review of Systems Review of Systems  Constitutional: Negative for fever, appetite change, fatigue and unexpected weight change.  Eyes: Negative for pain and visual disturbance.  Respiratory: Negative for cough and shortness of breath.   Cardiovascular: Negative for cp or palpitations    Gastrointestinal: Negative for nausea, diarrhea and constipation.  Genitourinary:  Negative for urgency and frequency.  Skin: Negative for pallor or rash   Neurological: Negative for weakness, light-headedness, numbness and headaches.  Hematological: Negative for adenopathy. Does not bruise/bleed easily.  Psychiatric/Behavioral: Negative for dysphoric mood. The patient is not nervous/anxious.         Objective:   Physical Exam  Constitutional: He appears well-developed and well-nourished. No distress.  obese and well appearing   HENT:  Head: Normocephalic and atraumatic.  Mouth/Throat: Oropharynx is clear and moist.  Eyes: Conjunctivae and EOM are normal. Pupils are equal, round, and reactive to light.  Neck: Normal range of motion. Neck supple. No JVD present. Carotid bruit is  not present. No thyromegaly present.  Cardiovascular: Normal rate, regular rhythm, normal heart sounds and intact distal pulses.  Exam reveals no gallop.   Pulmonary/Chest: Effort normal and breath sounds normal. No respiratory distress. He has no wheezes. He has no rales.  No crackles  Abdominal: Soft. Bowel sounds are normal. He exhibits no distension, no abdominal bruit and no mass. There is no tenderness.  Musculoskeletal: He exhibits no edema.  Lymphadenopathy:    He has no cervical adenopathy.  Neurological: He is alert. He has normal reflexes.  Skin: Skin is warm and dry. No rash noted.  Psychiatric: He has a normal mood and affect.          Assessment & Plan:   Problem List Items Addressed This Visit    Diabetes type 2, controlled    Lab Results  Component Value Date   HGBA1C 5.8 12/21/2014   Doing better with metformin and diet Will make his own eye exam appt  Enc to work on wt loss       Relevant Medications   metFORMIN (GLUCOPHAGE) 500 MG tablet   ESSENTIAL HYPERTENSION, BENIGN - Primary    bp in fair control at this time  BP Readings from Last 1 Encounters:  12/28/14 122/80   No changes needed Disc lifstyle change with low sodium diet and exercise  Lab reviewed F/u 6 mo  Enc wt loss       Obesity    Discussed how this problem influences overall health and the risks it imposes  Reviewed plan for weight loss with lower calorie diet (via better food choices and also portion control or program like weight watchers) and exercise building up to or more than 30 minutes 5 days per week including some aerobic activity         Relevant Medications   metFORMIN (GLUCOPHAGE) 500 MG tablet

## 2014-12-28 NOTE — Patient Instructions (Signed)
Glucose control is better Keep up the good work  Get your eye exam  Follow up in 6 months for annual exam with labs prior

## 2014-12-28 NOTE — Assessment & Plan Note (Signed)
Lab Results  Component Value Date   HGBA1C 5.8 12/21/2014   Doing better with metformin and diet Will make his own eye exam appt  Enc to work on wt loss

## 2014-12-28 NOTE — Progress Notes (Signed)
Pre visit review using our clinic review tool, if applicable. No additional management support is needed unless otherwise documented below in the visit note. 

## 2015-01-17 ENCOUNTER — Other Ambulatory Visit: Payer: Self-pay | Admitting: Family Medicine

## 2015-06-27 ENCOUNTER — Telehealth: Payer: Self-pay | Admitting: Family Medicine

## 2015-06-27 DIAGNOSIS — Z0000 Encounter for general adult medical examination without abnormal findings: Secondary | ICD-10-CM

## 2015-06-27 DIAGNOSIS — E119 Type 2 diabetes mellitus without complications: Secondary | ICD-10-CM

## 2015-06-27 DIAGNOSIS — R972 Elevated prostate specific antigen [PSA]: Secondary | ICD-10-CM

## 2015-06-27 NOTE — Telephone Encounter (Signed)
-----   Message from Ellamae Sia sent at 06/19/2015  4:52 PM EST ----- Regarding: lab orders for Friday, 12.16.16 Patient is scheduled for CPX labs, please order future labs, Thanks , Karna Christmas

## 2015-06-28 ENCOUNTER — Other Ambulatory Visit (INDEPENDENT_AMBULATORY_CARE_PROVIDER_SITE_OTHER): Payer: BLUE CROSS/BLUE SHIELD

## 2015-06-28 DIAGNOSIS — R972 Elevated prostate specific antigen [PSA]: Secondary | ICD-10-CM | POA: Diagnosis not present

## 2015-06-28 DIAGNOSIS — E119 Type 2 diabetes mellitus without complications: Secondary | ICD-10-CM

## 2015-06-28 DIAGNOSIS — Z0000 Encounter for general adult medical examination without abnormal findings: Secondary | ICD-10-CM

## 2015-06-28 LAB — PSA: PSA: 10.86 ng/mL — AB (ref 0.10–4.00)

## 2015-06-28 LAB — CBC WITH DIFFERENTIAL/PLATELET
BASOS PCT: 0.5 % (ref 0.0–3.0)
Basophils Absolute: 0 10*3/uL (ref 0.0–0.1)
EOS ABS: 0 10*3/uL (ref 0.0–0.7)
EOS PCT: 0.6 % (ref 0.0–5.0)
HEMATOCRIT: 46.4 % (ref 39.0–52.0)
HEMOGLOBIN: 15.4 g/dL (ref 13.0–17.0)
LYMPHS PCT: 31.8 % (ref 12.0–46.0)
Lymphs Abs: 1.7 10*3/uL (ref 0.7–4.0)
MCHC: 33 g/dL (ref 30.0–36.0)
MCV: 89.6 fl (ref 78.0–100.0)
MONOS PCT: 11.7 % (ref 3.0–12.0)
Monocytes Absolute: 0.6 10*3/uL (ref 0.1–1.0)
NEUTROS ABS: 2.9 10*3/uL (ref 1.4–7.7)
Neutrophils Relative %: 55.4 % (ref 43.0–77.0)
Platelets: 165 10*3/uL (ref 150.0–400.0)
RBC: 5.18 Mil/uL (ref 4.22–5.81)
RDW: 12.8 % (ref 11.5–15.5)
WBC: 5.3 10*3/uL (ref 4.0–10.5)

## 2015-06-28 LAB — LIPID PANEL
CHOLESTEROL: 162 mg/dL (ref 0–200)
HDL: 38 mg/dL — ABNORMAL LOW (ref >39.00)
LDL Cholesterol: 101 mg/dL — ABNORMAL HIGH (ref 0–99)
NonHDL: 123.86
TRIGLYCERIDES: 116 mg/dL (ref 0.0–149.0)
Total CHOL/HDL Ratio: 4
VLDL: 23.2 mg/dL (ref 0.0–40.0)

## 2015-06-28 LAB — COMPREHENSIVE METABOLIC PANEL
ALBUMIN: 4.3 g/dL (ref 3.5–5.2)
ALT: 49 U/L (ref 0–53)
AST: 30 U/L (ref 0–37)
Alkaline Phosphatase: 71 U/L (ref 39–117)
BILIRUBIN TOTAL: 1.5 mg/dL — AB (ref 0.2–1.2)
BUN: 18 mg/dL (ref 6–23)
CALCIUM: 9.9 mg/dL (ref 8.4–10.5)
CHLORIDE: 103 meq/L (ref 96–112)
CO2: 30 mEq/L (ref 19–32)
CREATININE: 0.91 mg/dL (ref 0.40–1.50)
GFR: 89.96 mL/min (ref >60.00)
Glucose, Bld: 123 mg/dL — ABNORMAL HIGH (ref 70–99)
Potassium: 4.2 mEq/L (ref 3.5–5.1)
Sodium: 140 mEq/L (ref 135–145)
Total Protein: 6.9 g/dL (ref 6.0–8.3)

## 2015-06-28 LAB — HEMOGLOBIN A1C: HEMOGLOBIN A1C: 6.2 % (ref 4.6–6.5)

## 2015-06-28 LAB — TSH: TSH: 2.07 u[IU]/mL (ref 0.35–4.50)

## 2015-07-05 ENCOUNTER — Encounter: Payer: Self-pay | Admitting: Family Medicine

## 2015-07-05 ENCOUNTER — Ambulatory Visit (INDEPENDENT_AMBULATORY_CARE_PROVIDER_SITE_OTHER): Payer: BLUE CROSS/BLUE SHIELD | Admitting: Family Medicine

## 2015-07-05 VITALS — BP 150/90 | HR 88 | Temp 98.2°F | Ht 69.5 in | Wt 241.8 lb

## 2015-07-05 DIAGNOSIS — E119 Type 2 diabetes mellitus without complications: Secondary | ICD-10-CM | POA: Diagnosis not present

## 2015-07-05 DIAGNOSIS — I10 Essential (primary) hypertension: Secondary | ICD-10-CM

## 2015-07-05 DIAGNOSIS — R972 Elevated prostate specific antigen [PSA]: Secondary | ICD-10-CM

## 2015-07-05 DIAGNOSIS — E669 Obesity, unspecified: Secondary | ICD-10-CM

## 2015-07-05 DIAGNOSIS — Z0000 Encounter for general adult medical examination without abnormal findings: Secondary | ICD-10-CM | POA: Diagnosis not present

## 2015-07-05 DIAGNOSIS — E7800 Pure hypercholesterolemia, unspecified: Secondary | ICD-10-CM

## 2015-07-05 MED ORDER — AMLODIPINE BESYLATE 5 MG PO TABS: 5.0000 mg | ORAL_TABLET | Freq: Every day | ORAL | Status: DC

## 2015-07-05 MED ORDER — LOSARTAN POTASSIUM-HCTZ 100-25 MG PO TABS: 1.0000 | ORAL_TABLET | Freq: Every day | ORAL | Status: DC

## 2015-07-05 NOTE — Patient Instructions (Addendum)
PSA increased - so see your urologist please  Blood pressure is high - add amlodipine 5 mg once daily  If any side effects or problems please let me know  Keep an eye on your blood pressure at home Follow up with me in 3 months with labs prior - after getting back on track with diet and exercise and weight loss  Get your eye exam as planned

## 2015-07-05 NOTE — Progress Notes (Signed)
Pre visit review using our clinic review tool, if applicable. No additional management support is needed unless otherwise documented below in the visit note. 

## 2015-07-05 NOTE — Progress Notes (Signed)
Subjective:    Patient ID: Daniel Lee, male    DOB: 1956-04-09, 59 y.o.   MRN: [id number]  HPI Here for health maintenance exam and to review chronic medical problems    Has been feeling good overall  Trying to take care of himself   Wt is up 3 lb with bmi of 35-thinks he ate too much over the holidays   Hep C/HIV screen-no risk factors - declines screening   Had shingles vaccine this year at the pharmacy   PNA - is due for as a diabetic   Td 6/07   Flu shot 11/16 Zoster shot also at that time   Colonoscopy 11/13 hyperplastic polyp -10 year recall   Hx of elevated psa and bph in the past  Had neg bx in 2003 Lab Results  Component Value Date   PSA 10.86* 06/28/2015   PSA 5.32* 02/20/2014   PSA 4.60* 06/23/2011   up this check  Was on proscar - stopped it (symptoms have not changed -nocturia 1-2 times per night- dep on what he drinks) , stream is not strong  Has urol in [place] - may change to [place]  Symptoms have not changed  Has family hx of prostate cancer - father    Baseline high bilirubin is down a bit to 1.5  Hyperlipidemia Lab Results  Component Value Date   CHOL 162 06/28/2015   CHOL 154 09/14/2014   CHOL 164 02/20/2014   Lab Results  Component Value Date   HDL 38.00* 06/28/2015   HDL 40.00 09/14/2014   HDL 38.20* 02/20/2014   Lab Results  Component Value Date   LDLCALC 101* 06/28/2015   LDLCALC 87 09/14/2014   LDLCALC 105* 02/20/2014   Lab Results  Component Value Date   TRIG 116.0 06/28/2015   TRIG 134.0 09/14/2014   TRIG 105.0 02/20/2014   Lab Results  Component Value Date   CHOLHDL 4 06/28/2015   CHOLHDL 4 09/14/2014   CHOLHDL 4 02/20/2014   Lab Results  Component Value Date   LDLDIRECT 99.9 06/23/2011   diet controlled HDL down and LDL is up form 87 to 101  He does try to watch fatty foods or fried foods  Chick filet twice per week - lately orders chicken strips and fries  Eats a fair amt of cheese No red meat  or shellfish  Was exercising more - until the time change - (likes to walk outside) Has an elliptical machine - does not like it   bp is up today- pt had stress this am and just drank coffee  No cp or palpitations or headaches or edema  No side effects to medicines  BP Readings from Last 3 Encounters:  07/05/15 150/90  12/28/14 122/80  09/21/14 130/85      DM2 Lab Results  Component Value Date   HGBA1C 6.2 06/28/2015  up from 5.8  He was eating more chocolate lately -plans to stop sweets completely  opthy- has it planned 12/29  No vision check   Patient Active Problem List   Diagnosis Date Noted  . Obesity 08/28/2013  . Hyperbilirubinemia 07/01/2012  . Routine general medical examination at a health care facility 06/22/2011  . ESSENTIAL HYPERTENSION, BENIGN 04/25/2009  . PURE HYPERCHOLESTEROLEMIA 07/19/2008  . Diabetes type 2, controlled ([place]) 04/16/2008  . OBESITY, UNSPECIFIED 04/16/2008  . LOW HDL 03/18/2007  . ERECTILE DYSFUNCTION 10/07/2006  . PROSTATE SPECIFIC ANTIGEN, ELEVATED 10/07/2006  . BENIGN PROSTATIC HYPERTROPHY, HX OF 10/07/2006   Past  Medical History  Diagnosis Date  . Hypertension   . BPH (benign prostatic hypertrophy)   . ED (erectile dysfunction)   . Hyperglycemia   . Elevated alanine aminotransferase (ALT) level     suspect fatty liver   Past Surgical History  Procedure Laterality Date  . Prostate surgery  2003    prostate biopsy negative // urologist  . Abd ultrasound  11/2005    negative   Social History  Substance Use Topics  . Smoking status: Former Research scientist (life sciences)  . Smokeless tobacco: None  . Alcohol Use: No     Comment: rarely   Family History  Problem Relation Age of Onset  . Hypertension Mother   . Hyperlipidemia Mother   . Cancer Father     prostate cancer   . Diabetes Father   . Stroke Father   . Heart disease Brother   . Diabetes Brother   . Kidney disease Brother   . Cancer Paternal Uncle     prostate cancer   No Known  Allergies Current Outpatient Prescriptions on File Prior to Visit  Medication Sig Dispense Refill  . aspirin 81 MG EC tablet Take 81 mg by mouth daily.     Marland Kitchen glucose blood (ONE TOUCH ULTRA TEST) test strip Check blood sugar twice daily or as directed 300 each 3  . losartan-hydrochlorothiazide (HYZAAR) 100-25 MG per tablet Take 1 tablet by mouth daily. 90 tablet 3  . metFORMIN (GLUCOPHAGE) 500 MG tablet Take 1 tablet (500 mg total) by mouth 2 (two) times daily with a meal. 180 tablet 3  . finasteride (PROSCAR) 5 MG tablet Take 1 tablet by mouth daily. Reported on 07/05/2015     No current facility-administered medications on file prior to visit.    Review of Systems Review of Systems  Constitutional: Negative for fever, appetite change, fatigue and unexpected weight change.  Eyes: Negative for pain and visual disturbance.  Respiratory: Negative for cough and shortness of breath.   Cardiovascular: Negative for cp or palpitations    Gastrointestinal: Negative for nausea, diarrhea and constipation.  Genitourinary: Negative for urgency and frequency. pos for nocturia Skin: Negative for pallor or rash   Neurological: Negative for weakness, light-headedness, numbness and headaches.  Hematological: Negative for adenopathy. Does not bruise/bleed easily.  Psychiatric/Behavioral: Negative for dysphoric mood. The patient is not nervous/anxious.         Objective:   Physical Exam  Constitutional: He appears well-developed and well-nourished. No distress.  obese and well appearing   HENT:  Head: Normocephalic and atraumatic.  Right Ear: External ear normal.  Left Ear: External ear normal.  Nose: Nose normal.  Mouth/Throat: Oropharynx is clear and moist.  Eyes: Conjunctivae and EOM are normal. Pupils are equal, round, and reactive to light. Right eye exhibits no discharge. Left eye exhibits no discharge. No scleral icterus.  Neck: Normal range of motion. Neck supple. No JVD present. Carotid  bruit is not present. No thyromegaly present.  Cardiovascular: Normal rate, regular rhythm, normal heart sounds and intact distal pulses.  Exam reveals no gallop.   Pulmonary/Chest: Effort normal and breath sounds normal. No respiratory distress. He has no wheezes. He exhibits no tenderness.  Abdominal: Soft. Bowel sounds are normal. He exhibits no distension, no abdominal bruit and no mass. There is no tenderness.  Musculoskeletal: He exhibits no edema or tenderness.  Lymphadenopathy:    He has no cervical adenopathy.  Neurological: He is alert. He has normal reflexes. No cranial nerve deficit. He exhibits normal  muscle tone. Coordination normal.  Skin: Skin is warm and dry. No rash noted. No erythema. No pallor.  Psychiatric: He has a normal mood and affect.          Assessment & Plan:   Problem List Items Addressed This Visit      Cardiovascular and Mediastinum   ESSENTIAL HYPERTENSION, BENIGN - Primary    bp is up significantly  Will add amlodipine 5 mg daily -rev poss side eff like edema  bp in fair control at this time  BP Readings from Last 1 Encounters:  07/05/15 150/90   Disc lifstyle change with low sodium diet and exercise  (DASH diet) Wt loss enc       Relevant Medications   amLODipine (NORVASC) 5 MG tablet   losartan-hydrochlorothiazide (HYZAAR) 100-25 MG tablet     Endocrine   Diabetes type 2, controlled ([place])    Lab Results  Component Value Date   HGBA1C 6.2 06/28/2015   This is up from 5.8 Disc imp of low glycemic diet and wt loss Also exercise  Pt will make his own opth exam F/u planned       Relevant Medications   losartan-hydrochlorothiazide (HYZAAR) 100-25 MG tablet     Other   Obesity    Discussed how this problem influences overall health and the risks it imposes  Reviewed plan for weight loss with lower calorie diet (via better food choices and also portion control or program like weight watchers) and exercise building up to or more than  30 minutes 5 days per week including some aerobic activity         PROSTATE SPECIFIC ANTIGEN, ELEVATED    Lab Results  Component Value Date   PSA 10.86* 06/28/2015   PSA 5.32* 02/20/2014   PSA 4.60* 06/23/2011   has had bx in the past (neg) He will schedule his own urology f/u and contact us if he needs a ref Of note-recently stopped proscar      PURE HYPERCHOLESTEROLEMIA    Disc goals for lipids and reasons to control them Rev labs with pt Rev low sat fat diet in detail LDL is up - not quite at goal  Will work on diet/ and re check/ f/u planned       Relevant Medications   amLODipine (NORVASC) 5 MG tablet   losartan-hydrochlorothiazide (HYZAAR) 100-25 MG tablet   Routine general medical examination at a health care facility    Reviewed health habits including diet and exercise and skin cancer prevention Reviewed appropriate screening tests for age  Also reviewed health mt list, fam hx and immunization status , as well as social and family history   See HPI Labs reviewed  PSA increased - so see your urologist please  Blood pressure is high - add amlodipine 5 mg once daily  If any side effects or problems please let me know  Keep an eye on your blood pressure at home Follow up with me in 3 months with labs prior - after getting back on track with diet and exercise and weight loss  Get your eye exam as planned

## 2015-07-08 NOTE — Assessment & Plan Note (Signed)
Discussed how this problem influences overall health and the risks it imposes  Reviewed plan for weight loss with lower calorie diet (via better food choices and also portion control or program like weight watchers) and exercise building up to or more than 30 minutes 5 days per week including some aerobic activity    

## 2015-07-08 NOTE — Assessment & Plan Note (Signed)
Lab Results  Component Value Date   PSA 10.86* 06/28/2015   PSA 5.32* 02/20/2014   PSA 4.60* 06/23/2011   has had bx in the past (neg) He will schedule his own urology f/u and contact us if he needs a ref Of note-recently stopped proscar

## 2015-07-08 NOTE — Assessment & Plan Note (Signed)
Lab Results  Component Value Date   HGBA1C 6.2 06/28/2015   This is up from 5.8 Disc imp of low glycemic diet and wt loss Also exercise  Pt will make his own opth exam F/u planned

## 2015-07-08 NOTE — Assessment & Plan Note (Signed)
bp is up significantly  Will add amlodipine 5 mg daily -rev poss side eff like edema  bp in fair control at this time  BP Readings from Last 1 Encounters:  07/05/15 150/90   Disc lifstyle change with low sodium diet and exercise  (DASH diet) Wt loss enc

## 2015-07-08 NOTE — Assessment & Plan Note (Signed)
Disc goals for lipids and reasons to control them Rev labs with pt Rev low sat fat diet in detail LDL is up - not quite at goal  Will work on diet/ and re check/ f/u planned

## 2015-07-08 NOTE — Assessment & Plan Note (Signed)
Reviewed health habits including diet and exercise and skin cancer prevention Reviewed appropriate screening tests for age  Also reviewed health mt list, fam hx and immunization status , as well as social and family history   See HPI Labs reviewed  PSA increased - so see your urologist please  Blood pressure is high - add amlodipine 5 mg once daily  If any side effects or problems please let me know  Keep an eye on your blood pressure at home Follow up with me in 3 months with labs prior - after getting back on track with diet and exercise and weight loss  Get your eye exam as planned

## 2015-07-17 ENCOUNTER — Other Ambulatory Visit: Payer: Self-pay | Admitting: Family Medicine

## 2015-08-08 LAB — HM DIABETES EYE EXAM

## 2015-08-09 ENCOUNTER — Encounter: Payer: Self-pay | Admitting: Family Medicine

## 2015-09-27 ENCOUNTER — Other Ambulatory Visit (INDEPENDENT_AMBULATORY_CARE_PROVIDER_SITE_OTHER): Payer: BLUE CROSS/BLUE SHIELD

## 2015-09-27 DIAGNOSIS — I10 Essential (primary) hypertension: Secondary | ICD-10-CM

## 2015-09-27 DIAGNOSIS — E119 Type 2 diabetes mellitus without complications: Secondary | ICD-10-CM | POA: Diagnosis not present

## 2015-09-27 DIAGNOSIS — E7800 Pure hypercholesterolemia, unspecified: Secondary | ICD-10-CM

## 2015-09-27 LAB — COMPREHENSIVE METABOLIC PANEL
ALBUMIN: 4.4 g/dL (ref 3.5–5.2)
ALT: 46 U/L (ref 0–53)
AST: 29 U/L (ref 0–37)
Alkaline Phosphatase: 62 U/L (ref 39–117)
BILIRUBIN TOTAL: 1.9 mg/dL — AB (ref 0.2–1.2)
BUN: 15 mg/dL (ref 6–23)
CHLORIDE: 103 meq/L (ref 96–112)
CO2: 30 meq/L (ref 19–32)
CREATININE: 0.9 mg/dL (ref 0.40–1.50)
Calcium: 9.6 mg/dL (ref 8.4–10.5)
GFR: 91.04 mL/min (ref >60.00)
Glucose, Bld: 126 mg/dL — ABNORMAL HIGH (ref 70–99)
Potassium: 4.2 mEq/L (ref 3.5–5.1)
SODIUM: 140 meq/L (ref 135–145)
Total Protein: 7 g/dL (ref 6.0–8.3)

## 2015-09-27 LAB — HEMOGLOBIN A1C: HEMOGLOBIN A1C: 6.2 % (ref 4.6–6.5)

## 2015-09-27 LAB — LIPID PANEL
CHOL/HDL RATIO: 4
Cholesterol: 150 mg/dL (ref 0–200)
HDL: 34.1 mg/dL — ABNORMAL LOW (ref >39.00)
LDL CALC: 83 mg/dL (ref 0–99)
NonHDL: 116.16
Triglycerides: 166 mg/dL — ABNORMAL HIGH (ref 0.0–149.0)
VLDL: 33.2 mg/dL (ref 0.0–40.0)

## 2015-10-04 ENCOUNTER — Telehealth: Payer: Self-pay

## 2015-10-04 ENCOUNTER — Encounter: Payer: Self-pay | Admitting: Family Medicine

## 2015-10-04 ENCOUNTER — Ambulatory Visit (INDEPENDENT_AMBULATORY_CARE_PROVIDER_SITE_OTHER): Payer: BLUE CROSS/BLUE SHIELD | Admitting: Family Medicine

## 2015-10-04 VITALS — BP 125/75 | HR 77 | Temp 98.0°F | Ht 69.5 in | Wt 238.0 lb

## 2015-10-04 DIAGNOSIS — E7800 Pure hypercholesterolemia, unspecified: Secondary | ICD-10-CM

## 2015-10-04 DIAGNOSIS — E669 Obesity, unspecified: Secondary | ICD-10-CM

## 2015-10-04 DIAGNOSIS — E119 Type 2 diabetes mellitus without complications: Secondary | ICD-10-CM | POA: Diagnosis not present

## 2015-10-04 DIAGNOSIS — I10 Essential (primary) hypertension: Secondary | ICD-10-CM | POA: Diagnosis not present

## 2015-10-04 DIAGNOSIS — R972 Elevated prostate specific antigen [PSA]: Secondary | ICD-10-CM

## 2015-10-04 MED ORDER — GLUCOSE BLOOD VI STRP: ORAL_STRIP | Status: DC

## 2015-10-04 MED ORDER — AMLODIPINE BESYLATE 5 MG PO TABS: 5.0000 mg | ORAL_TABLET | Freq: Every day | ORAL | Status: DC

## 2015-10-04 MED ORDER — BAYER CONTOUR NEXT MONITOR W/DEVICE KIT: PACK | Status: DC

## 2015-10-04 MED ORDER — METFORMIN HCL 500 MG PO TABS: 500.0000 mg | ORAL_TABLET | Freq: Two times a day (BID) | ORAL | Status: DC

## 2015-10-04 NOTE — Progress Notes (Signed)
   Subjective:    Patient ID: Daniel Lee, male    DOB: [birth year], 60 y.o.   MRN: [id number]  HPI Here for f/u of chronic health problems  Obesity Wt is down 3lb with bmi of  He has been going to the park- walking an hour almost every day (for the past month) and also aiming for 10,000 steps in a day   Eating has not changed - trying to eat smaller portions   bp is improved today   (note at home today 125/80)-some white coat component  No cp or palpitations or headaches or edema  No side effects to medicines  BP Readings from Last 3 Encounters:  10/04/15 138/86  07/05/15 150/90  12/28/14 122/80     Last visit added amlodipine 5 mg - no side effects   DM2 Lab Results  Component Value Date   HGBA1C 6.2 09/27/2015   This is the same -no change from last time  Metformin and diet opth exam 1/17 -no retinopathy  Needs px for contour next strips (new for his ins) -has not been checking-he gets a new meter for free  Walking helps    Cholesterol Diet controlled Lab Results  Component Value Date   CHOL 150 09/27/2015   CHOL 162 06/28/2015   CHOL 154 09/14/2014   Lab Results  Component Value Date   HDL 34.10* 09/27/2015   HDL 38.00* 06/28/2015   HDL 40.00 09/14/2014   Lab Results  Component Value Date   LDLCALC 83 09/27/2015   LDLCALC 101* 06/28/2015   LDLCALC 87 09/14/2014   Lab Results  Component Value Date   TRIG 166.0* 09/27/2015   TRIG 116.0 06/28/2015   TRIG 134.0 09/14/2014   Lab Results  Component Value Date   CHOLHDL 4 09/27/2015   CHOLHDL 4 06/28/2015   CHOLHDL 4 09/14/2014   Lab Results  Component Value Date   LDLDIRECT 99.9 06/23/2011   LDL cholesterol came down  Is exercising for HDL   Is going to urologist  Lab Results  Component Value Date   PSA 10.86* 06/28/2015   PSA 5.32* 02/20/2014   PSA 4.60* 06/23/2011   he ran out of his prostate med    Review of Systems     Objective:   Physical Exam        Assessment & Plan:

## 2015-10-04 NOTE — Patient Instructions (Signed)
Follow up with urology for your psa  Take care of yourself Work on healthy diet and exercise and weight loss  Follow up in 6 months with labs prior

## 2015-10-04 NOTE — Telephone Encounter (Signed)
Pt request contour next meter sent to [facility]. Pt advised done.

## 2015-10-06 NOTE — Assessment & Plan Note (Signed)
Discussed how this problem influences overall health and the risks it imposes  Reviewed plan for weight loss with lower calorie diet (via better food choices and also portion control or program like weight watchers) and exercise building up to or more than 30 minutes 5 days per week including some aerobic activity    

## 2015-10-06 NOTE — Assessment & Plan Note (Signed)
bp in fair control at this time  BP Readings from Last 1 Encounters:  10/04/15 125/75   No changes needed Disc lifstyle change with low sodium diet and exercise  Labs reviewed Enc to keep exercising

## 2015-10-06 NOTE — Assessment & Plan Note (Signed)
Lab Results  Component Value Date   HGBA1C 6.2 09/27/2015   This is stable Well controlled Urged further wt loss and exercise and low glycemic diet

## 2015-10-06 NOTE — Assessment & Plan Note (Signed)
Enc pt again to f/u with urology regarding elevated psa-also ran out of med for BPH

## 2015-10-06 NOTE — Assessment & Plan Note (Signed)
Disc goals for lipids and reasons to control them Rev labs with pt Rev low sat fat diet in detail This was imp with better diet-commended

## 2016-04-03 ENCOUNTER — Other Ambulatory Visit (INDEPENDENT_AMBULATORY_CARE_PROVIDER_SITE_OTHER): Payer: BLUE CROSS/BLUE SHIELD

## 2016-04-03 DIAGNOSIS — I10 Essential (primary) hypertension: Secondary | ICD-10-CM

## 2016-04-03 DIAGNOSIS — E119 Type 2 diabetes mellitus without complications: Secondary | ICD-10-CM

## 2016-04-03 DIAGNOSIS — E7800 Pure hypercholesterolemia, unspecified: Secondary | ICD-10-CM

## 2016-04-03 LAB — COMPREHENSIVE METABOLIC PANEL
ALT: 70 U/L — AB (ref 0–53)
AST: 42 U/L — ABNORMAL HIGH (ref 0–37)
Albumin: 4.3 g/dL (ref 3.5–5.2)
Alkaline Phosphatase: 69 U/L (ref 39–117)
BUN: 16 mg/dL (ref 6–23)
CO2: 30 meq/L (ref 19–32)
Calcium: 9.6 mg/dL (ref 8.4–10.5)
Chloride: 103 mEq/L (ref 96–112)
Creatinine, Ser: 0.98 mg/dL (ref 0.40–1.50)
GFR: 82.38 mL/min (ref >60.00)
GLUCOSE: 132 mg/dL — AB (ref 70–99)
POTASSIUM: 4 meq/L (ref 3.5–5.1)
Sodium: 140 mEq/L (ref 135–145)
Total Bilirubin: 1.7 mg/dL — ABNORMAL HIGH (ref 0.2–1.2)
Total Protein: 7.4 g/dL (ref 6.0–8.3)

## 2016-04-03 LAB — LIPID PANEL
CHOL/HDL RATIO: 4
Cholesterol: 162 mg/dL (ref 0–200)
HDL: 39.6 mg/dL (ref >39.00)
LDL Cholesterol: 93 mg/dL (ref 0–99)
NONHDL: 122.04
Triglycerides: 146 mg/dL (ref 0.0–149.0)
VLDL: 29.2 mg/dL (ref 0.0–40.0)

## 2016-04-03 LAB — HEMOGLOBIN A1C: Hgb A1c MFr Bld: 6.6 % — ABNORMAL HIGH (ref 4.6–6.5)

## 2016-04-10 ENCOUNTER — Encounter: Payer: Self-pay | Admitting: Family Medicine

## 2016-04-10 ENCOUNTER — Ambulatory Visit (INDEPENDENT_AMBULATORY_CARE_PROVIDER_SITE_OTHER): Payer: BLUE CROSS/BLUE SHIELD | Admitting: Family Medicine

## 2016-04-10 VITALS — BP 108/74 | HR 80 | Temp 98.2°F | Ht 69.5 in | Wt 240.8 lb

## 2016-04-10 DIAGNOSIS — R7401 Elevation of levels of liver transaminase levels: Secondary | ICD-10-CM

## 2016-04-10 DIAGNOSIS — E669 Obesity, unspecified: Secondary | ICD-10-CM | POA: Diagnosis not present

## 2016-04-10 DIAGNOSIS — I10 Essential (primary) hypertension: Secondary | ICD-10-CM | POA: Diagnosis not present

## 2016-04-10 DIAGNOSIS — Z23 Encounter for immunization: Secondary | ICD-10-CM | POA: Diagnosis not present

## 2016-04-10 DIAGNOSIS — E7800 Pure hypercholesterolemia, unspecified: Secondary | ICD-10-CM

## 2016-04-10 DIAGNOSIS — R740 Nonspecific elevation of levels of transaminase and lactic acid dehydrogenase [LDH]: Secondary | ICD-10-CM

## 2016-04-10 DIAGNOSIS — E119 Type 2 diabetes mellitus without complications: Secondary | ICD-10-CM | POA: Diagnosis not present

## 2016-04-10 NOTE — Assessment & Plan Note (Signed)
Disc goals for lipids and reasons to control them Rev labs with pt Rev low sat fat diet in detail LDL is up a bit-but still under 100

## 2016-04-10 NOTE — Progress Notes (Signed)
Pre visit review using our clinic review tool, if applicable. No additional management support is needed unless otherwise documented below in the visit note. 

## 2016-04-10 NOTE — Assessment & Plan Note (Signed)
bp in fair control at this time  BP Readings from Last 1 Encounters:  04/10/16 108/74   No changes needed Disc lifstyle change with low sodium diet and exercise

## 2016-04-10 NOTE — Patient Instructions (Addendum)
Make a plan for indoor exercise when you cannot exercise outside  Goal to exercise 5 days per week (30 minutes or more) Cut portions and stick to a diabetic diet (decrease carbs) to loose weight  Goal is 1 lb per week for weight loss  This hopefully will help liver tests  Follow up in 3 months with labs prior

## 2016-04-10 NOTE — Assessment & Plan Note (Signed)
Suspect due to fatty liver- pt has had this before and it improves with wt loss  Will work on low fat/carb diet and exercise Re check in 3 months

## 2016-04-10 NOTE — Assessment & Plan Note (Signed)
Lab Results  Component Value Date   HGBA1C 6.6 (H) 04/03/2016   Up a little Disc fine tuning diet (no candy) and inc exercise to 5 d per week with goal of wt loss as well  F/u 3 mo lab prior

## 2016-04-10 NOTE — Assessment & Plan Note (Signed)
Discussed how this problem influences overall health and the risks it imposes  Reviewed plan for weight loss with lower calorie diet (via better food choices and also portion control or program like weight watchers) and exercise building up to or more than 30 minutes 5 days per week including some aerobic activity   He will need to pick and indoor exercise for the days he cannot walk outdoors  Goal of 1 lb per week loss is a good start
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
## 2016-06-28 ENCOUNTER — Telehealth: Payer: Self-pay | Admitting: Family Medicine

## 2016-06-28 DIAGNOSIS — I10 Essential (primary) hypertension: Secondary | ICD-10-CM

## 2016-06-28 DIAGNOSIS — E119 Type 2 diabetes mellitus without complications: Secondary | ICD-10-CM

## 2016-06-28 DIAGNOSIS — E7800 Pure hypercholesterolemia, unspecified: Secondary | ICD-10-CM

## 2016-06-28 NOTE — Telephone Encounter (Signed)
-----   Message from Marchia Bond sent at 06/24/2016  3:53 PM EST ----- Regarding: 3 mo f/u labs Fri 12/22, need orders. Thanks! :-) Please order  future 3 mo f/u labs for pt's upcoming lab appt. Thanks Aniceto Boss

## 2016-07-03 ENCOUNTER — Other Ambulatory Visit (INDEPENDENT_AMBULATORY_CARE_PROVIDER_SITE_OTHER): Payer: BLUE CROSS/BLUE SHIELD

## 2016-07-03 DIAGNOSIS — I10 Essential (primary) hypertension: Secondary | ICD-10-CM

## 2016-07-03 DIAGNOSIS — E119 Type 2 diabetes mellitus without complications: Secondary | ICD-10-CM

## 2016-07-03 LAB — COMPREHENSIVE METABOLIC PANEL
ALK PHOS: 67 U/L (ref 39–117)
ALT: 51 U/L (ref 0–53)
AST: 27 U/L (ref 0–37)
Albumin: 4.2 g/dL (ref 3.5–5.2)
BUN: 17 mg/dL (ref 6–23)
CHLORIDE: 105 meq/L (ref 96–112)
CO2: 29 meq/L (ref 19–32)
Calcium: 9.2 mg/dL (ref 8.4–10.5)
Creatinine, Ser: 0.88 mg/dL (ref 0.40–1.50)
GFR: 93.2 mL/min (ref >60.00)
Glucose, Bld: 125 mg/dL — ABNORMAL HIGH (ref 70–99)
POTASSIUM: 3.7 meq/L (ref 3.5–5.1)
SODIUM: 140 meq/L (ref 135–145)
TOTAL PROTEIN: 6.9 g/dL (ref 6.0–8.3)
Total Bilirubin: 1.3 mg/dL — ABNORMAL HIGH (ref 0.2–1.2)

## 2016-07-03 LAB — HEMOGLOBIN A1C: Hgb A1c MFr Bld: 6.3 % (ref 4.6–6.5)

## 2016-07-10 ENCOUNTER — Ambulatory Visit (INDEPENDENT_AMBULATORY_CARE_PROVIDER_SITE_OTHER): Payer: BLUE CROSS/BLUE SHIELD | Admitting: Family Medicine

## 2016-07-10 ENCOUNTER — Encounter: Payer: Self-pay | Admitting: Family Medicine

## 2016-07-10 VITALS — BP 126/78 | HR 85 | Temp 99.0°F | Ht 69.5 in | Wt 236.5 lb

## 2016-07-10 DIAGNOSIS — Z6834 Body mass index (BMI) 34.0-34.9, adult: Secondary | ICD-10-CM

## 2016-07-10 DIAGNOSIS — I10 Essential (primary) hypertension: Secondary | ICD-10-CM | POA: Diagnosis not present

## 2016-07-10 DIAGNOSIS — E786 Lipoprotein deficiency: Secondary | ICD-10-CM | POA: Diagnosis not present

## 2016-07-10 DIAGNOSIS — E7800 Pure hypercholesterolemia, unspecified: Secondary | ICD-10-CM

## 2016-07-10 DIAGNOSIS — E119 Type 2 diabetes mellitus without complications: Secondary | ICD-10-CM | POA: Diagnosis not present

## 2016-07-10 DIAGNOSIS — E6609 Other obesity due to excess calories: Secondary | ICD-10-CM

## 2016-07-10 MED ORDER — GLUCOSE BLOOD VI STRP
ORAL_STRIP | 3 refills | Status: DC
Start: 2016-07-10 — End: 2016-08-07

## 2016-07-10 MED ORDER — LOSARTAN POTASSIUM-HCTZ 100-25 MG PO TABS: ORAL_TABLET | ORAL | 3 refills | Status: DC

## 2016-07-10 MED ORDER — METFORMIN HCL 500 MG PO TABS: 500.0000 mg | ORAL_TABLET | Freq: Two times a day (BID) | ORAL | 3 refills | Status: DC

## 2016-07-10 MED ORDER — AMLODIPINE BESYLATE 5 MG PO TABS: 5.0000 mg | ORAL_TABLET | Freq: Every day | ORAL | 3 refills | Status: DC

## 2016-07-10 NOTE — Progress Notes (Signed)
Pre visit review using our clinic review tool, if applicable. No additional management support is needed unless otherwise documented below in the visit note. 

## 2016-07-10 NOTE — Patient Instructions (Addendum)
You are going to be due for an eye exam in January  Go ahead and schedule that  Weight and A1C is down - keep working on diet and weight loss  Check glucose at least 3-4 times per week -some in am and some 2 hours after a meal Get back to an exercise program   Follow up in 6 months for annual exam with lab prior

## 2016-07-10 NOTE — Assessment & Plan Note (Signed)
bp in fair control at this time  BP Readings from Last 1 Encounters:  07/10/16 126/78   No changes needed Disc lifstyle change with low sodium diet and exercise  Labs reviewed Enc wt loss

## 2016-07-10 NOTE — Assessment & Plan Note (Signed)
bmi 34.4 Has lost 4-5 lb  Enc him to keep it up   Discussed how this problem influences overall health and the risks it imposes  Reviewed plan for weight loss with lower calorie diet (via better food choices and also portion control or program like weight watchers) and exercise building up to or more than 30 minutes 5 days per week including some aerobic activity

## 2016-07-10 NOTE — Progress Notes (Signed)
Subjective:    Patient ID: Daniel Lee, male    DOB: [birth year], 60 y.o.   MRN: [id number]  HPI  Here for f/u of chronic medical problems   Feeling good   Wt Readings from Last 3 Encounters:  07/10/16 236 lb 8 oz (107.3 kg)  04/10/16 240 lb 12 oz (109.2 kg)  10/04/15 238 lb (108 kg)  has been working on wt loss  Cut back on portions  Was walking a lot until holidays - plans to start back soon (traveling for work)  bmi is 34.4  bp is stable today  No cp or palpitations or headaches or edema  No side effects to medicines  BP Readings from Last 3 Encounters:  07/10/16 126/78  04/10/16 108/74  10/04/15 125/75      Diabetes Home sugar results - am is usually 130s , one was 78  DM diet - but back a whole lot , cut sugar out as much as possible -no candy! Exercise - a lot until this month  Symptoms A1C last  Lab Results  Component Value Date   HGBA1C 6.3 07/03/2016  improved  This is down from 6.6  No problems with medications - metformin  Renal protection- on ARB Last eye exam  1/17  Hx of hyperlipidemia Lab Results  Component Value Date   CHOL 162 04/03/2016   HDL 39.60 04/03/2016   LDLCALC 93 04/03/2016   LDLDIRECT 99.9 06/23/2011   TRIG 146.0 04/03/2016   CHOLHDL 4 04/03/2016   LDL was in goal range     Chemistry      Component Value Date/Time   NA 140 07/03/2016 0814   K 3.7 07/03/2016 0814   CL 105 07/03/2016 0814   CO2 29 07/03/2016 0814   BUN 17 07/03/2016 0814   CREATININE 0.88 07/03/2016 0814      Component Value Date/Time   CALCIUM 9.2 07/03/2016 0814   ALKPHOS 67 07/03/2016 0814   AST 27 07/03/2016 0814   ALT 51 07/03/2016 0814   BILITOT 1.3 (H) 07/03/2016 0814      Patient Active Problem List   Diagnosis Date Noted  . Obesity 08/28/2013  . Hyperbilirubinemia 07/01/2012  . Routine general medical examination at a health care facility 06/22/2011  . ESSENTIAL HYPERTENSION, BENIGN 04/25/2009  . PURE HYPERCHOLESTEROLEMIA 07/19/2008   . Diabetes type 2, controlled ([place]) 04/16/2008  . Elevated transaminase level 04/16/2008  . Low HDL (under 40) 03/18/2007  . ERECTILE DYSFUNCTION 10/07/2006  . PROSTATE SPECIFIC ANTIGEN, ELEVATED 10/07/2006  . BENIGN PROSTATIC HYPERTROPHY, HX OF 10/07/2006   Past Medical History:  Diagnosis Date  . BPH (benign prostatic hypertrophy)   . ED (erectile dysfunction)   . Elevated alanine aminotransferase (ALT) level    suspect fatty liver  . Hyperglycemia   . Hypertension    Past Surgical History:  Procedure Laterality Date  . abd ultrasound  11/2005   negative  . PROSTATE SURGERY  2003   prostate biopsy negative // urologist   Social History  Substance Use Topics  . Smoking status: Former Research scientist (life sciences)  . Smokeless tobacco: Never Used  . Alcohol use No   Family History  Problem Relation Age of Onset  . Hypertension Mother   . Hyperlipidemia Mother   . Cancer Father     prostate cancer   . Diabetes Father   . Stroke Father   . Heart disease Brother   . Diabetes Brother   . Kidney disease Brother   . Cancer Paternal  Uncle     prostate cancer   No Known Allergies Current Outpatient Prescriptions on File Prior to Visit  Medication Sig Dispense Refill  . aspirin 81 MG EC tablet Take 81 mg by mouth daily.     . Blood Glucose Monitoring Suppl (BAYER CONTOUR NEXT MONITOR) w/Device KIT Check blood sugar daily and as directed. 1 kit 0  . finasteride (PROSCAR) 5 MG tablet Take 1 tablet by mouth daily. Reported on 07/05/2015    . glucose blood (ONE TOUCH ULTRA TEST) test strip Check blood sugar twice daily or as directed 300 each 3   No current facility-administered medications on file prior to visit.     Review of Systems Review of Systems  Constitutional: Negative for fever, appetite change, fatigue and unexpected weight change.  Eyes: Negative for pain and visual disturbance.  Respiratory: Negative for cough and shortness of breath.   Cardiovascular: Negative for cp or  palpitations    Gastrointestinal: Negative for nausea, diarrhea and constipation.  Genitourinary: Negative for urgency and frequency.  Skin: Negative for pallor or rash   Neurological: Negative for weakness, light-headedness, numbness and headaches.  Hematological: Negative for adenopathy. Does not bruise/bleed easily.  Psychiatric/Behavioral: Negative for dysphoric mood. The patient is not nervous/anxious.  pos for some stressors with job change       Objective:   Physical Exam  Constitutional: He appears well-developed and well-nourished. No distress.  obese and well appearing   HENT:  Head: Normocephalic and atraumatic.  Mouth/Throat: Oropharynx is clear and moist.  Eyes: Conjunctivae and EOM are normal. Pupils are equal, round, and reactive to light.  Neck: Normal range of motion. Neck supple. No JVD present. Carotid bruit is not present. No thyromegaly present.  Cardiovascular: Normal rate, regular rhythm, normal heart sounds and intact distal pulses.  Exam reveals no gallop.   Pulmonary/Chest: Effort normal and breath sounds normal. No respiratory distress. He has no wheezes. He has no rales.  No crackles  Abdominal: Soft. Bowel sounds are normal. He exhibits no distension, no abdominal bruit and no mass. There is no tenderness.  Musculoskeletal: He exhibits no edema.  Lymphadenopathy:    He has no cervical adenopathy.  Neurological: He is alert. He has normal reflexes.  Skin: Skin is warm and dry. No rash noted.  Psychiatric: He has a normal mood and affect.          Assessment & Plan:   Problem List Items Addressed This Visit      Cardiovascular and Mediastinum   ESSENTIAL HYPERTENSION, BENIGN - Primary    bp in fair control at this time  BP Readings from Last 1 Encounters:  07/10/16 126/78   No changes needed Disc lifstyle change with low sodium diet and exercise  Labs reviewed Enc wt loss       Relevant Medications   losartan-hydrochlorothiazide (HYZAAR)  100-25 MG tablet   amLODipine (NORVASC) 5 MG tablet     Endocrine   Diabetes type 2, controlled ([place])    Lab Results  Component Value Date   HGBA1C 6.3 07/03/2016   Improved with better diet  Will get back to walking program next month Enc wt loss - working on that  No change in metformin  F/u 6 mo      Relevant Medications   losartan-hydrochlorothiazide (HYZAAR) 100-25 MG tablet   metFORMIN (GLUCOPHAGE) 500 MG tablet     Other   PURE HYPERCHOLESTEROLEMIA    This has been controlled with diet  Disc goals  for lipids and reasons to control them Rev labs with pt Rev low sat fat diet in detail LDL is below 100 HDL is close to 40- enc exercise for this       Relevant Medications   losartan-hydrochlorothiazide (HYZAAR) 100-25 MG tablet   amLODipine (NORVASC) 5 MG tablet   Obesity    bmi 34.4 Has lost 4-5 lb  Enc him to keep it up   Discussed how this problem influences overall health and the risks it imposes  Reviewed plan for weight loss with lower calorie diet (via better food choices and also portion control or program like weight watchers) and exercise building up to or more than 30 minutes 5 days per week including some aerobic activity         Relevant Medications   metFORMIN (GLUCOPHAGE) 500 MG tablet   Low HDL (under 40)    Almost 40 (39.6) Enc exercise Check at f/u 6 mo

## 2016-07-10 NOTE — Assessment & Plan Note (Signed)
Lab Results  Component Value Date   HGBA1C 6.3 07/03/2016   Improved with better diet  Will get back to walking program next month Enc wt loss - working on that  No change in metformin  F/u 6 mo

## 2016-07-10 NOTE — Assessment & Plan Note (Signed)
Almost 40 (39.6) Enc exercise Check at f/u 6 mo

## 2016-07-10 NOTE — Assessment & Plan Note (Signed)
This has been controlled with diet  Disc goals for lipids and reasons to control them Rev labs with pt Rev low sat fat diet in detail LDL is below 100 HDL is close to 40- enc exercise for this

## 2016-07-14 LAB — HM DIABETES EYE EXAM

## 2016-08-07 ENCOUNTER — Other Ambulatory Visit: Payer: Self-pay | Admitting: *Deleted

## 2016-08-07 MED ORDER — GLUCOSE BLOOD VI STRP: ORAL_STRIP | 5 refills | Status: DC

## 2017-01-21 ENCOUNTER — Telehealth: Payer: Self-pay | Admitting: Family Medicine

## 2017-01-21 DIAGNOSIS — Z0000 Encounter for general adult medical examination without abnormal findings: Secondary | ICD-10-CM

## 2017-01-21 DIAGNOSIS — E119 Type 2 diabetes mellitus without complications: Secondary | ICD-10-CM

## 2017-01-21 DIAGNOSIS — N401 Enlarged prostate with lower urinary tract symptoms: Secondary | ICD-10-CM

## 2017-01-21 NOTE — Telephone Encounter (Signed)
-----   Message from Marchia Bond sent at 01/15/2017 10:19 AM EDT ----- Regarding: Cpx labs Fri 7/13, need orders. Thanks ! :-) Please order  future cpx labs for pt's upcoming lab appt. Thanks Daniel Lee

## 2017-01-22 ENCOUNTER — Encounter: Payer: BLUE CROSS/BLUE SHIELD | Admitting: Family Medicine

## 2017-01-22 ENCOUNTER — Other Ambulatory Visit (INDEPENDENT_AMBULATORY_CARE_PROVIDER_SITE_OTHER): Payer: 59

## 2017-01-22 DIAGNOSIS — E119 Type 2 diabetes mellitus without complications: Secondary | ICD-10-CM

## 2017-01-22 DIAGNOSIS — Z0000 Encounter for general adult medical examination without abnormal findings: Secondary | ICD-10-CM | POA: Diagnosis not present

## 2017-01-22 DIAGNOSIS — N401 Enlarged prostate with lower urinary tract symptoms: Secondary | ICD-10-CM

## 2017-01-22 LAB — TSH: TSH: 2.5 u[IU]/mL (ref 0.35–4.50)

## 2017-01-22 LAB — CBC WITH DIFFERENTIAL/PLATELET
BASOS ABS: 0 10*3/uL (ref 0.0–0.1)
BASOS PCT: 0.8 % (ref 0.0–3.0)
EOS PCT: 0.7 % (ref 0.0–5.0)
Eosinophils Absolute: 0 10*3/uL (ref 0.0–0.7)
HEMATOCRIT: 43 % (ref 39.0–52.0)
Hemoglobin: 14.8 g/dL (ref 13.0–17.0)
LYMPHS ABS: 1.7 10*3/uL (ref 0.7–4.0)
LYMPHS PCT: 30.2 % (ref 12.0–46.0)
MCHC: 34.4 g/dL (ref 30.0–36.0)
MCV: 89.8 fl (ref 78.0–100.0)
MONOS PCT: 11 % (ref 3.0–12.0)
Monocytes Absolute: 0.6 10*3/uL (ref 0.1–1.0)
NEUTROS ABS: 3.2 10*3/uL (ref 1.4–7.7)
Neutrophils Relative %: 57.3 % (ref 43.0–77.0)
PLATELETS: 164 10*3/uL (ref 150.0–400.0)
RBC: 4.79 Mil/uL (ref 4.22–5.81)
RDW: 13.4 % (ref 11.5–15.5)
WBC: 5.6 10*3/uL (ref 4.0–10.5)

## 2017-01-22 LAB — COMPREHENSIVE METABOLIC PANEL
ALT: 41 U/L (ref 0–53)
AST: 25 U/L (ref 0–37)
Albumin: 4.3 g/dL (ref 3.5–5.2)
Alkaline Phosphatase: 59 U/L (ref 39–117)
BILIRUBIN TOTAL: 1.7 mg/dL — AB (ref 0.2–1.2)
BUN: 19 mg/dL (ref 6–23)
CALCIUM: 9.8 mg/dL (ref 8.4–10.5)
CO2: 29 meq/L (ref 19–32)
Chloride: 103 mEq/L (ref 96–112)
Creatinine, Ser: 1 mg/dL (ref 0.40–1.50)
GFR: 80.27 mL/min (ref >60.00)
Glucose, Bld: 124 mg/dL — ABNORMAL HIGH (ref 70–99)
POTASSIUM: 4.2 meq/L (ref 3.5–5.1)
Sodium: 141 mEq/L (ref 135–145)
Total Protein: 6.7 g/dL (ref 6.0–8.3)

## 2017-01-22 LAB — LIPID PANEL
CHOL/HDL RATIO: 4
Cholesterol: 146 mg/dL (ref 0–200)
HDL: 38.6 mg/dL — AB (ref >39.00)
LDL Cholesterol: 82 mg/dL (ref 0–99)
NonHDL: 107.12
TRIGLYCERIDES: 124 mg/dL (ref 0.0–149.0)
VLDL: 24.8 mg/dL (ref 0.0–40.0)

## 2017-01-22 LAB — PSA: PSA: 12.06 ng/mL — ABNORMAL HIGH (ref 0.10–4.00)

## 2017-01-22 LAB — HEMOGLOBIN A1C: HEMOGLOBIN A1C: 6.1 % (ref 4.6–6.5)

## 2017-01-26 ENCOUNTER — Encounter: Payer: Self-pay | Admitting: Family Medicine

## 2017-01-26 ENCOUNTER — Ambulatory Visit (INDEPENDENT_AMBULATORY_CARE_PROVIDER_SITE_OTHER): Payer: 59 | Admitting: Family Medicine

## 2017-01-26 VITALS — BP 126/78 | HR 78 | Temp 98.0°F | Ht 68.5 in | Wt 234.2 lb

## 2017-01-26 DIAGNOSIS — I10 Essential (primary) hypertension: Secondary | ICD-10-CM | POA: Diagnosis not present

## 2017-01-26 DIAGNOSIS — E7800 Pure hypercholesterolemia, unspecified: Secondary | ICD-10-CM | POA: Diagnosis not present

## 2017-01-26 DIAGNOSIS — N401 Enlarged prostate with lower urinary tract symptoms: Secondary | ICD-10-CM | POA: Diagnosis not present

## 2017-01-26 DIAGNOSIS — Z6834 Body mass index (BMI) 34.0-34.9, adult: Secondary | ICD-10-CM | POA: Diagnosis not present

## 2017-01-26 DIAGNOSIS — Z23 Encounter for immunization: Secondary | ICD-10-CM | POA: Diagnosis not present

## 2017-01-26 DIAGNOSIS — E119 Type 2 diabetes mellitus without complications: Secondary | ICD-10-CM

## 2017-01-26 DIAGNOSIS — R972 Elevated prostate specific antigen [PSA]: Secondary | ICD-10-CM | POA: Diagnosis not present

## 2017-01-26 DIAGNOSIS — Z0000 Encounter for general adult medical examination without abnormal findings: Secondary | ICD-10-CM | POA: Diagnosis not present

## 2017-01-26 DIAGNOSIS — E6609 Other obesity due to excess calories: Secondary | ICD-10-CM

## 2017-01-26 MED ORDER — AMLODIPINE BESYLATE 5 MG PO TABS: 5.0000 mg | ORAL_TABLET | Freq: Every day | ORAL | 3 refills | Status: DC

## 2017-01-26 MED ORDER — METFORMIN HCL 500 MG PO TABS: 500.0000 mg | ORAL_TABLET | Freq: Two times a day (BID) | ORAL | 3 refills | Status: DC

## 2017-01-26 MED ORDER — LOSARTAN POTASSIUM-HCTZ 100-25 MG PO TABS: ORAL_TABLET | ORAL | 3 refills | Status: DC

## 2017-01-26 NOTE — Assessment & Plan Note (Signed)
Disc goals for lipids and reasons to control them Rev labs with pt Rev low sat fat diet in detail  Stable with good diet  Continue to follow

## 2017-01-26 NOTE — Assessment & Plan Note (Signed)
bp in fair control at this time  BP Readings from Last 1 Encounters:  01/26/17 126/78   No changes needed Disc lifstyle change with low sodium diet and exercise  Labs reviewed  Enc wt loss

## 2017-01-26 NOTE — Assessment & Plan Note (Signed)
Lab Results  Component Value Date   PSA 12.06 (H) 01/22/2017   PSA 10.86 (H) 06/28/2015   PSA 5.32 (H) 02/20/2014   due for urology f/u  No change in symptoms Ref done

## 2017-01-26 NOTE — Assessment & Plan Note (Signed)
Reviewed health habits including diet and exercise and skin cancer prevention Reviewed appropriate screening tests for age  Also reviewed health mt list, fam hx and immunization status , as well as social and family history   See HPI Labs rev  Tdap vaccine today  Urology consult for increasing psa

## 2017-01-26 NOTE — Assessment & Plan Note (Signed)
Discussed how this problem influences overall health and the risks it imposes  Reviewed plan for weight loss with lower calorie diet (via better food choices and also portion control or program like weight watchers) and exercise building up to or more than 30 minutes 5 days per week including some aerobic activity    

## 2017-01-26 NOTE — Patient Instructions (Signed)
We will refer you to urology  Keep working on diet and exercise and weight loss  Tdap vaccine today   Labs are stable   Follow up in 6 months

## 2017-01-26 NOTE — Progress Notes (Signed)
Subjective:    Patient ID: Daniel Lee, male    DOB: 11/13/1955, 61 y.o.   MRN: [id number]  HPI Here for health maintenance exam and to review chronic medical problems    Feeling pretty good   Wt Readings from Last 3 Encounters:  01/26/17 234 lb 4 oz (106.3 kg)  07/10/16 236 lb 8 oz (107.3 kg)  04/10/16 240 lb 12 oz (109.2 kg)  down 2 lb  He is eating healthy - more salads with grilled chicken  He works outside a lot for exercise / was walking until it got hot  Getting at least 30 min per day  35.10 kg/m  Colonoscopy 11/13 Was told 10 y recall   Prostate screen Needs to get another urologist - did not go this last year  One neg bx in the past  Was going to [facility]  Has enl prostate Takes proscar  Nocturia holding at 2 times per night  No change in flow  Lab Results  Component Value Date   PSA 12.06 (H) 01/22/2017   PSA 10.86 (H) 06/28/2015   PSA 5.32 (H) 02/20/2014   Eye exam 1/18 no retinopathy  Tetanus shot 6/07 Will get that today   pna vaccine  He would rather hold off until he is 63  Does not tend to get colds or uri   Flu shot 9/17  zostavax 11/16  bp is stable today  No cp or palpitations or headaches or edema  No side effects to medicines  BP Readings from Last 3 Encounters:  01/26/17 126/78  07/10/16 126/78  04/10/16 108/74      DM2 Lab Results  Component Value Date   HGBA1C 6.1 01/22/2017  doing well with diabetes- really trying to eat better  Eye exam -utd  ARB for renal prot  Hyperlipidemia Lab Results  Component Value Date   CHOL 146 01/22/2017   CHOL 162 04/03/2016   CHOL 150 09/27/2015   Lab Results  Component Value Date   HDL 38.60 (L) 01/22/2017   HDL 39.60 04/03/2016   HDL 34.10 (L) 09/27/2015   Lab Results  Component Value Date   LDLCALC 82 01/22/2017   LDLCALC 93 04/03/2016   LDLCALC 83 09/27/2015   Lab Results  Component Value Date   TRIG 124.0 01/22/2017   TRIG 146.0 04/03/2016   TRIG 166.0 (H)  09/27/2015   Lab Results  Component Value Date   CHOLHDL 4 01/22/2017   CHOLHDL 4 04/03/2016   CHOLHDL 4 09/27/2015   Lab Results  Component Value Date   LDLDIRECT 99.9 06/23/2011  holding steady with cholesterol with good diet      Chemistry      Component Value Date/Time   NA 141 01/22/2017 0738   K 4.2 01/22/2017 0738   CL 103 01/22/2017 0738   CO2 29 01/22/2017 0738   BUN 19 01/22/2017 0738   CREATININE 1.00 01/22/2017 0738      Component Value Date/Time   CALCIUM 9.8 01/22/2017 0738   ALKPHOS 59 01/22/2017 0738   AST 25 01/22/2017 0738   ALT 41 01/22/2017 0738   BILITOT 1.7 (H) 01/22/2017 0738     glucose 124  Lab Results  Component Value Date   WBC 5.6 01/22/2017   HGB 14.8 01/22/2017   HCT 43.0 01/22/2017   MCV 89.8 01/22/2017   PLT 164.0 01/22/2017    Lab Results  Component Value Date   TSH 2.50 01/22/2017     Patient Active Problem List  Diagnosis Date Noted  . Obesity 08/28/2013  . Hyperbilirubinemia 07/01/2012  . Routine general medical examination at a health care facility 06/22/2011  . ESSENTIAL HYPERTENSION, BENIGN 04/25/2009  . PURE HYPERCHOLESTEROLEMIA 07/19/2008  . Diabetes type 2, controlled ([place]) 04/16/2008  . Elevated transaminase level 04/16/2008  . Low HDL (under 40) 03/18/2007  . ERECTILE DYSFUNCTION 10/07/2006  . PROSTATE SPECIFIC ANTIGEN, ELEVATED 10/07/2006  . BPH (benign prostatic hyperplasia) 10/07/2006   Past Medical History:  Diagnosis Date  . BPH (benign prostatic hypertrophy)   . ED (erectile dysfunction)   . Elevated alanine aminotransferase (ALT) level    suspect fatty liver  . Hyperglycemia   . Hypertension    Past Surgical History:  Procedure Laterality Date  . abd ultrasound  11/2005   negative  . PROSTATE SURGERY  2003   prostate biopsy negative // urologist   Social History  Substance Use Topics  . Smoking status: Former Research scientist (life sciences)  . Smokeless tobacco: Never Used  . Alcohol use No   Family History    Problem Relation Age of Onset  . Hypertension Mother   . Hyperlipidemia Mother   . Cancer Father        prostate cancer   . Diabetes Father   . Stroke Father   . Heart disease Brother   . Diabetes Brother   . Kidney disease Brother   . Cancer Paternal Uncle        prostate cancer   No Known Allergies Current Outpatient Prescriptions on File Prior to Visit  Medication Sig Dispense Refill  . aspirin 81 MG EC tablet Take 81 mg by mouth daily.     . Blood Glucose Monitoring Suppl (BAYER CONTOUR NEXT MONITOR) w/Device KIT Check blood sugar daily and as directed. 1 kit 0  . finasteride (PROSCAR) 5 MG tablet Take 1 tablet by mouth daily. Reported on 07/05/2015    . glucose blood test strip ONE TOUCH ULTRA/VERIO: Use to check blood sugar twice daily for DM (dx. E11.9) 100 each 5   No current facility-administered medications on file prior to visit.     Review of Systems Review of Systems  Constitutional: Negative for fever, appetite change, fatigue and unexpected weight change.  Eyes: Negative for pain and visual disturbance.  Respiratory: Negative for cough and shortness of breath.   Cardiovascular: Negative for cp or palpitations    Gastrointestinal: Negative for nausea, diarrhea and constipation.  Genitourinary: Negative for urgency and pos for frequency.  Skin: Negative for pallor or rash   Neurological: Negative for weakness, light-headedness, numbness and headaches.  Hematological: Negative for adenopathy. Does not bruise/bleed easily.  Psychiatric/Behavioral: Negative for dysphoric mood. The patient is not nervous/anxious.         Objective:   Physical Exam  Constitutional: He appears well-developed and well-nourished. No distress.  obese and well appearing   HENT:  Head: Normocephalic and atraumatic.  Right Ear: External ear normal.  Left Ear: External ear normal.  Nose: Nose normal.  Mouth/Throat: Oropharynx is clear and moist.  Eyes: Pupils are equal, round, and  reactive to light. Conjunctivae and EOM are normal. Right eye exhibits no discharge. Left eye exhibits no discharge. No scleral icterus.  Neck: Normal range of motion. Neck supple. No JVD present. Carotid bruit is not present. No thyromegaly present.  Cardiovascular: Normal rate, regular rhythm, normal heart sounds and intact distal pulses.  Exam reveals no gallop.   Pulmonary/Chest: Effort normal and breath sounds normal. No respiratory distress. He has no wheezes.  He exhibits no tenderness.  Abdominal: Soft. Bowel sounds are normal. He exhibits no distension, no abdominal bruit and no mass. There is no tenderness.  Musculoskeletal: He exhibits no edema or tenderness.  Lymphadenopathy:    He has no cervical adenopathy.  Neurological: He is alert. He has normal reflexes. No cranial nerve deficit. He exhibits normal muscle tone. Coordination normal.  Skin: Skin is warm and dry. No rash noted. No erythema. No pallor.  Solar lentigines diffusely   Psychiatric: He has a normal mood and affect.          Assessment & Plan:   Problem List Items Addressed This Visit      Cardiovascular and Mediastinum   ESSENTIAL HYPERTENSION, BENIGN - Primary    bp in fair control at this time  BP Readings from Last 1 Encounters:  01/26/17 126/78   No changes needed Disc lifstyle change with low sodium diet and exercise  Labs reviewed  Enc wt loss       Relevant Medications   amLODipine (NORVASC) 5 MG tablet   losartan-hydrochlorothiazide (HYZAAR) 100-25 MG tablet     Endocrine   Diabetes type 2, controlled ([place])    Lab Results  Component Value Date   HGBA1C 6.1 01/22/2017   Doing well and trying to eat better  Enc to continue low glycemic diet and exercise for wt loss F/u 6 mo       Relevant Medications   losartan-hydrochlorothiazide (HYZAAR) 100-25 MG tablet   metFORMIN (GLUCOPHAGE) 500 MG tablet     Genitourinary   BPH (benign prostatic hyperplasia)    Lab Results  Component Value  Date   PSA 12.06 (H) 01/22/2017   PSA 10.86 (H) 06/28/2015   PSA 5.32 (H) 02/20/2014   due for urology f/u  No change in symptoms Ref done         Other   Obesity    Discussed how this problem influences overall health and the risks it imposes  Reviewed plan for weight loss with lower calorie diet (via better food choices and also portion control or program like weight watchers) and exercise building up to or more than 30 minutes 5 days per week including some aerobic activity         Relevant Medications   metFORMIN (GLUCOPHAGE) 500 MG tablet   PROSTATE SPECIFIC ANTIGEN, ELEVATED    This continues to rise with BPH Has had neg bx  Overdue for f/u with urology-ref done       Relevant Orders   Ambulatory referral to Urology   PURE HYPERCHOLESTEROLEMIA    Disc goals for lipids and reasons to control them Rev labs with pt Rev low sat fat diet in detail  Stable with good diet  Continue to follow       Relevant Medications   amLODipine (NORVASC) 5 MG tablet   losartan-hydrochlorothiazide (HYZAAR) 100-25 MG tablet   Routine general medical examination at a health care facility    Reviewed health habits including diet and exercise and skin cancer prevention Reviewed appropriate screening tests for age  Also reviewed health mt list, fam hx and immunization status , as well as social and family history   See HPI Labs rev  Tdap vaccine today  Urology consult for increasing psa         Other Visit Diagnoses    Need for Tdap vaccination       Relevant Orders   Tdap vaccine greater than or equal to 7yo IM (Completed)

## 2017-01-26 NOTE — Assessment & Plan Note (Signed)
This continues to rise with BPH Has had neg bx  Overdue for f/u with urology-ref done

## 2017-01-26 NOTE — Assessment & Plan Note (Signed)
Lab Results  Component Value Date   HGBA1C 6.1 01/22/2017   Doing well and trying to eat better  Enc to continue low glycemic diet and exercise for wt loss F/u 6 mo

## 2017-01-27 ENCOUNTER — Other Ambulatory Visit: Payer: Self-pay | Admitting: Family Medicine

## 2017-02-18 ENCOUNTER — Ambulatory Visit (INDEPENDENT_AMBULATORY_CARE_PROVIDER_SITE_OTHER): Payer: 59 | Admitting: Urology

## 2017-02-18 ENCOUNTER — Encounter: Payer: Self-pay | Admitting: Urology

## 2017-02-18 VITALS — BP 168/97 | HR 96 | Ht 68.0 in | Wt 236.5 lb

## 2017-02-18 DIAGNOSIS — R972 Elevated prostate specific antigen [PSA]: Secondary | ICD-10-CM

## 2017-02-18 DIAGNOSIS — N400 Enlarged prostate without lower urinary tract symptoms: Secondary | ICD-10-CM | POA: Diagnosis not present

## 2017-02-18 LAB — URINALYSIS, COMPLETE
Bilirubin, UA: NEGATIVE
GLUCOSE, UA: NEGATIVE
Ketones, UA: NEGATIVE
Leukocytes, UA: NEGATIVE
Nitrite, UA: NEGATIVE
Protein, UA: NEGATIVE
RBC, UA: NEGATIVE
Specific Gravity, UA: 1.015 (ref 1.005–1.030)
UUROB: 0.2 mg/dL (ref 0.2–1.0)
pH, UA: 6 (ref 5.0–7.5)

## 2017-02-18 NOTE — Progress Notes (Signed)
02/18/2017 11:52 AM   Daniel Lee December 14, 1955 [id number]  Referring provider: Abner Greenspan, MD [address] [place], [place] [address]  Chief Complaint  Patient presents with  . New Patient (Initial Visit)    elevated psa referred by     HPI: The patient is a 61 year old gentleman with a past medical history of BPH and elevated PSA presents today for elevated PSA evaluation.  1. Elevated PSA PSA was 12.06 in July 2018. The patient does have a previous history of prostate biopsy in 2003 and 2005 which were normal per his report.  PSA History: 7/18    12.06 12/16  10.86 8/15     5.32 (last PSA while on finasteride) 2013    4.60    2. BPH Has been on finasteride in the past, but he stopped this medication. He does have mild urinary frequency and mild weakening of his stream. He has nocturia 1. He does not find these symptoms bothersome.  PMH: Past Medical History:  Diagnosis Date  . BPH (benign prostatic hypertrophy)   . ED (erectile dysfunction)   . Elevated alanine aminotransferase (ALT) level    suspect fatty liver  . Hyperglycemia   . Hypertension     Surgical History: Past Surgical History:  Procedure Laterality Date  . abd ultrasound  11/2005   negative  . PROSTATE SURGERY  2003   prostate biopsy negative // urologist    Home Medications:  Allergies as of 02/18/2017   No Known Allergies     Medication List       Accurate as of 02/18/17 11:52 AM. Always use your most recent med list.          amLODipine 5 MG tablet Commonly known as:  NORVASC Take 1 tablet (5 mg total) by mouth daily.   aspirin 81 MG EC tablet Take 81 mg by mouth daily.   BAYER CONTOUR NEXT MONITOR w/Device Kit Check blood sugar daily and as directed.   finasteride 5 MG tablet Commonly known as:  PROSCAR Take 1 tablet by mouth daily. Reported on 07/05/2015   glucose blood test strip ONE TOUCH ULTRA/VERIO: Use to check blood sugar twice daily for DM (dx.  E11.9)   losartan-hydrochlorothiazide 100-25 MG tablet Commonly known as:  HYZAAR TAKE 1 BY MOUTH DAILY   metFORMIN 500 MG tablet Commonly known as:  GLUCOPHAGE TAKE 1 TABLET BY MOUTH TWICE A DAY WITH A MEAL       Allergies: No Known Allergies  Family History: Family History  Problem Relation Age of Onset  . Cancer Father        prostate cancer   . Diabetes Father   . Stroke Father   . Prostate cancer Father   . Hypertension Mother   . Hyperlipidemia Mother   . Bladder Cancer Mother   . Heart disease Brother   . Diabetes Brother   . Kidney disease Brother   . Cancer Paternal Uncle        prostate cancer  . Kidney cancer Neg Hx     Social History:  reports that he quit smoking about 15 years ago. He has never used smokeless tobacco. He reports that he does not drink alcohol or use drugs.  ROS: UROLOGY Frequent Urination?: Yes Hard to postpone urination?: Yes Burning/pain with urination?: No Get up at night to urinate?: Yes Leakage of urine?: Yes Urine stream starts and stops?: No Trouble starting stream?: No Do you have to strain to urinate?: No  Blood in urine?: No Urinary tract infection?: No Sexually transmitted disease?: No Injury to kidneys or bladder?: No Painful intercourse?: No Weak stream?: Yes Erection problems?: No Penile pain?: No  Gastrointestinal Nausea?: No Vomiting?: No Indigestion/heartburn?: No Diarrhea?: No Constipation?: No  Constitutional Fever: No Night sweats?: No Weight loss?: No Fatigue?: No  Skin Skin rash/lesions?: No Itching?: No  Eyes Blurred vision?: No Double vision?: No  Ears/Nose/Throat Sore throat?: No Sinus problems?: No  Hematologic/Lymphatic Swollen glands?: No Easy bruising?: No  Cardiovascular Leg swelling?: No Chest pain?: No  Respiratory Cough?: No Shortness of breath?: No  Endocrine Excessive thirst?: No  Musculoskeletal Back pain?: No Joint pain?: No  Neurological Headaches?:  No Dizziness?: No  Psychologic Depression?: No Anxiety?: No  Physical Exam: BP (!) 168/97   Pulse 96   Ht _0  (1.727 m)   Wt 236 lb 8 oz (107.3 kg)   BMI 35.96 kg/m   Constitutional:  Alert and oriented, No acute distress. HEENT: [place] AT, moist mucus membranes.  Trachea midline, no masses. Cardiovascular: No clubbing, cyanosis, or edema. Respiratory: Normal respiratory effort, no increased work of breathing. GI: Abdomen is soft, nontender, nondistended, no abdominal masses GU: No CVA tenderness. Normal phallus. Testicles descended equally bilaterally. Benign. DRE: 2+ smooth benign. Skin: No rashes, bruises or suspicious lesions. Lymph: No cervical or inguinal adenopathy. Neurologic: Grossly intact, no focal deficits, moving all 4 extremities. Psychiatric: Normal mood and affect.  Laboratory Data: Lab Results  Component Value Date   WBC 5.6 01/22/2017   HGB 14.8 01/22/2017   HCT 43.0 01/22/2017   MCV 89.8 01/22/2017   PLT 164.0 01/22/2017    Lab Results  Component Value Date   CREATININE 1.00 01/22/2017    Lab Results  Component Value Date   PSA 12.06 (H) 01/22/2017   PSA 10.86 (H) 06/28/2015   PSA 5.32 (H) 02/20/2014    No results found for: TESTOSTERONE  Lab Results  Component Value Date   HGBA1C 6.1 01/22/2017    Urinalysis No results found for: COLORURINE, APPEARANCEUR, LABSPEC, PHURINE, GLUCOSEU, HGBUR, BILIRUBINUR, KETONESUR, PROTEINUR, UROBILINOGEN, NITRITE, LEUKOCYTESUR   Assessment & Plan:    1. Elevated PSA I discussed with the patient that his PSA has trended up over the last year. He also has not had biopsy over a decade his PSA is now over 12. I think would be reasonable for him to undergo a biopsy at this time of his prostate. He is agreeable to proceeding. We reviewed the risks and benefits which she is artery from there with. He does understand the risks include bleeding in his stool, urine, and semen as well as infection.   Return for  prostate biopsy.  Nickie Retort, MD  [facility] [address], [place], [place] [address] [phone]

## 2017-03-19 ENCOUNTER — Encounter: Payer: Self-pay | Admitting: Urology

## 2017-03-19 ENCOUNTER — Ambulatory Visit (INDEPENDENT_AMBULATORY_CARE_PROVIDER_SITE_OTHER): Payer: 59 | Admitting: Urology

## 2017-03-19 ENCOUNTER — Other Ambulatory Visit: Payer: Self-pay | Admitting: Urology

## 2017-03-19 VITALS — BP 120/72 | HR 103 | Ht 68.0 in | Wt 234.3 lb

## 2017-03-19 DIAGNOSIS — R972 Elevated prostate specific antigen [PSA]: Secondary | ICD-10-CM | POA: Diagnosis not present

## 2017-03-19 MED ORDER — GENTAMICIN SULFATE 40 MG/ML IJ SOLN
80.0000 mg | Freq: Once | INTRAMUSCULAR | Status: AC
  Administered 2017-03-19: 80 mg via INTRAMUSCULAR

## 2017-03-19 MED ORDER — LEVOFLOXACIN 500 MG PO TABS
500.0000 mg | ORAL_TABLET | Freq: Once | ORAL | Status: AC
  Administered 2017-03-19: 500 mg via ORAL

## 2017-03-19 MED ORDER — LIDOCAINE HCL 2 % EX GEL
1.0000 "application " | Freq: Once | CUTANEOUS | Status: AC
  Administered 2017-03-19: 1 via URETHRAL

## 2017-03-19 NOTE — Progress Notes (Signed)
Prostate Biopsy Procedure   Informed consent was obtained after discussing risks/benefits of the procedure.  A time out was performed to ensure correct patient identity.  Pre-Procedure: - Last PSA Level:  Lab Results  Component Value Date   PSA 12.06 (H) 01/22/2017   PSA 10.86 (H) 06/28/2015   PSA 5.32 (H) 02/20/2014   - Gentamicin given prophylactically - Levaquin 500 mg administered PO -Transrectal Ultrasound performed revealing a 144 gm prostate -No significant hypoechoic or median lobe noted  Procedure: - Prostate block performed using 10 cc 1% lidocaine and biopsies taken from sextant areas, a total of 12 under ultrasound guidance.  Post-Procedure: - Patient tolerated the procedure well - He was counseled to seek immediate medical attention if experiences any severe pain, significant bleeding, or fevers - Return in one week to discuss biopsy results

## 2017-03-23 ENCOUNTER — Other Ambulatory Visit: Payer: Self-pay | Admitting: Urology

## 2017-03-23 LAB — PATHOLOGY REPORT

## 2017-04-01 ENCOUNTER — Ambulatory Visit: Payer: 59

## 2017-04-25 ENCOUNTER — Other Ambulatory Visit: Payer: Self-pay | Admitting: Family Medicine

## 2017-07-12 ENCOUNTER — Other Ambulatory Visit: Payer: Self-pay | Admitting: Family Medicine

## 2017-07-12 NOTE — Telephone Encounter (Signed)
Copied from [place] [id number]. Topic: Quick Communication - Rx Refill/Question >> Jul 12, 2017 12:48 PM Synthia Innocent wrote: Has the patient contacted their pharmacy? Yes.     (Agent: If no, request that the patient contact the pharmacy for the refill.)   Preferred Pharmacy (with phone number or street name): [facility] [address]    Agent: Please be advised that RX refills may take up to 3 business days. We ask that you follow-up with your pharmacy. Requesting 90 day supply refill on  ONETOUCH VERIO test strip

## 2017-07-18 ENCOUNTER — Telehealth: Payer: Self-pay | Admitting: Family Medicine

## 2017-07-18 DIAGNOSIS — I10 Essential (primary) hypertension: Secondary | ICD-10-CM

## 2017-07-18 DIAGNOSIS — E7800 Pure hypercholesterolemia, unspecified: Secondary | ICD-10-CM

## 2017-07-18 DIAGNOSIS — E119 Type 2 diabetes mellitus without complications: Secondary | ICD-10-CM

## 2017-07-18 NOTE — Telephone Encounter (Signed)
-----   Message from Ellamae Sia sent at 07/14/2017 11:10 AM EST ----- Regarding: Lab orders for Friday, 1.11.19 Lab orders for a 6 month follow up appt

## 2017-07-23 ENCOUNTER — Other Ambulatory Visit (INDEPENDENT_AMBULATORY_CARE_PROVIDER_SITE_OTHER): Payer: 59

## 2017-07-23 DIAGNOSIS — E7800 Pure hypercholesterolemia, unspecified: Secondary | ICD-10-CM

## 2017-07-23 DIAGNOSIS — E119 Type 2 diabetes mellitus without complications: Secondary | ICD-10-CM | POA: Diagnosis not present

## 2017-07-23 DIAGNOSIS — I10 Essential (primary) hypertension: Secondary | ICD-10-CM | POA: Diagnosis not present

## 2017-07-23 LAB — COMPREHENSIVE METABOLIC PANEL
ALT: 51 U/L (ref 0–53)
AST: 28 U/L (ref 0–37)
Albumin: 4.5 g/dL (ref 3.5–5.2)
Alkaline Phosphatase: 67 U/L (ref 39–117)
BUN: 17 mg/dL (ref 6–23)
CO2: 31 mEq/L (ref 19–32)
Calcium: 9.9 mg/dL (ref 8.4–10.5)
Chloride: 101 mEq/L (ref 96–112)
Creatinine, Ser: 0.9 mg/dL (ref 0.40–1.50)
GFR: 90.5 mL/min (ref >60.00)
Glucose, Bld: 150 mg/dL — ABNORMAL HIGH (ref 70–99)
Potassium: 3.9 mEq/L (ref 3.5–5.1)
Sodium: 138 mEq/L (ref 135–145)
Total Bilirubin: 1.9 mg/dL — ABNORMAL HIGH (ref 0.2–1.2)
Total Protein: 7.2 g/dL (ref 6.0–8.3)

## 2017-07-23 LAB — LIPID PANEL
Cholesterol: 150 mg/dL (ref 0–200)
HDL: 36.7 mg/dL — ABNORMAL LOW (ref >39.00)
LDL CALC: 91 mg/dL (ref 0–99)
NONHDL: 112.95
TRIGLYCERIDES: 109 mg/dL (ref 0.0–149.0)
Total CHOL/HDL Ratio: 4
VLDL: 21.8 mg/dL (ref 0.0–40.0)

## 2017-07-23 LAB — HEMOGLOBIN A1C: Hgb A1c MFr Bld: 6.6 % — ABNORMAL HIGH (ref 4.6–6.5)

## 2017-07-30 ENCOUNTER — Encounter: Payer: Self-pay | Admitting: Family Medicine

## 2017-07-30 ENCOUNTER — Ambulatory Visit (INDEPENDENT_AMBULATORY_CARE_PROVIDER_SITE_OTHER): Payer: 59 | Admitting: Family Medicine

## 2017-07-30 VITALS — BP 126/72 | HR 89 | Temp 98.1°F | Ht 68.0 in | Wt 240.5 lb

## 2017-07-30 DIAGNOSIS — I10 Essential (primary) hypertension: Secondary | ICD-10-CM

## 2017-07-30 DIAGNOSIS — E786 Lipoprotein deficiency: Secondary | ICD-10-CM | POA: Diagnosis not present

## 2017-07-30 DIAGNOSIS — E6609 Other obesity due to excess calories: Secondary | ICD-10-CM

## 2017-07-30 DIAGNOSIS — N401 Enlarged prostate with lower urinary tract symptoms: Secondary | ICD-10-CM | POA: Diagnosis not present

## 2017-07-30 DIAGNOSIS — E119 Type 2 diabetes mellitus without complications: Secondary | ICD-10-CM

## 2017-07-30 DIAGNOSIS — R972 Elevated prostate specific antigen [PSA]: Secondary | ICD-10-CM

## 2017-07-30 DIAGNOSIS — Z6834 Body mass index (BMI) 34.0-34.9, adult: Secondary | ICD-10-CM | POA: Diagnosis not present

## 2017-07-30 DIAGNOSIS — E7800 Pure hypercholesterolemia, unspecified: Secondary | ICD-10-CM

## 2017-07-30 MED ORDER — GLUCOSE BLOOD VI STRP: ORAL_STRIP | 12 refills | Status: DC

## 2017-07-30 MED ORDER — ATORVASTATIN CALCIUM 10 MG PO TABS: 10.0000 mg | ORAL_TABLET | Freq: Every day | ORAL | 11 refills | Status: DC

## 2017-07-30 MED ORDER — ACCU-CHEK MULTICLIX LANCETS MISC: 12 refills | Status: AC

## 2017-07-30 MED ORDER — ACCU-CHEK AVIVA DEVI: 0 refills | Status: AC

## 2017-07-30 NOTE — Progress Notes (Signed)
Subjective:    Patient ID: Daniel Lee, male    DOB: 03/08/1956, 62 y.o.   MRN: [id number]  HPI  Here for f/u of chronic health problems   Had good holidays  Ate too much   Wt Readings from Last 3 Encounters:  07/30/17 240 lb 8 oz (109.1 kg)  03/19/17 234 lb 4.8 oz (106.3 kg)  02/18/17 236 lb 8 oz (107.3 kg)  holiday gain- taking it back off   (was 246 last Friday)  Now eating salads and grilled chicken  Was walking for exercise until weather got bad -has an elliptical machine also  36.57 kg/m   Had a prostate bx- neg for ca Urology following closely for elevated psa   bp is stable today  No cp or palpitations or headaches or edema  No side effects to medicines  BP Readings from Last 3 Encounters:  07/30/17 126/72  03/19/17 120/72  02/18/17 (!) 168/97     DM2 Lab Results  Component Value Date   HGBA1C 6.6 (H) 07/23/2017   Up from 6.1 - from holiday eating  Eye exam 1/18 (has appt in feb)  Arb for renal protection  Metformin for glucose control  Lab Results  Component Value Date   CREATININE 0.90 07/23/2017   BUN 17 07/23/2017   NA 138 07/23/2017   K 3.9 07/23/2017   CL 101 07/23/2017   CO2 31 07/23/2017   Lab Results  Component Value Date   ALT 51 07/23/2017   AST 28 07/23/2017   ALKPHOS 67 07/23/2017   BILITOT 1.9 (H) 07/23/2017    Hyperlipidemia Lab Results  Component Value Date   CHOL 150 07/23/2017   CHOL 146 01/22/2017   CHOL 162 04/03/2016   Lab Results  Component Value Date   HDL 36.70 (L) 07/23/2017   HDL 38.60 (L) 01/22/2017   HDL 39.60 04/03/2016   Lab Results  Component Value Date   LDLCALC 91 07/23/2017   LDLCALC 82 01/22/2017   LDLCALC 93 04/03/2016   Lab Results  Component Value Date   TRIG 109.0 07/23/2017   TRIG 124.0 01/22/2017   TRIG 146.0 04/03/2016   Lab Results  Component Value Date   CHOLHDL 4 07/23/2017   CHOLHDL 4 01/22/2017   CHOLHDL 4 04/03/2016   Lab Results  Component Value Date   LDLDIRECT  99.9 06/23/2011   diet controlled  Holiday eating  He is open to starting statin for diabetes   He does not want his pneumonia shot yet   Patient Active Problem List   Diagnosis Date Noted  . Obesity 08/28/2013  . Hyperbilirubinemia 07/01/2012  . Routine general medical examination at a health care facility 06/22/2011  . ESSENTIAL HYPERTENSION, BENIGN 04/25/2009  . PURE HYPERCHOLESTEROLEMIA 07/19/2008  . Diabetes type 2, controlled ([place]) 04/16/2008  . Elevated transaminase level 04/16/2008  . Low HDL (under 40) 03/18/2007  . ERECTILE DYSFUNCTION 10/07/2006  . PROSTATE SPECIFIC ANTIGEN, ELEVATED 10/07/2006  . BPH (benign prostatic hyperplasia) 10/07/2006   Past Medical History:  Diagnosis Date  . BPH (benign prostatic hypertrophy)   . ED (erectile dysfunction)   . Elevated alanine aminotransferase (ALT) level    suspect fatty liver  . Hyperglycemia   . Hypertension    Past Surgical History:  Procedure Laterality Date  . abd ultrasound  11/2005   negative  . PROSTATE SURGERY  2003   prostate biopsy negative // urologist   Social History   Tobacco Use  . Smoking status: Former Smoker  Last attempt to quit: 07/13/2001    Years since quitting: 16.0  . Smokeless tobacco: Never Used  Substance Use Topics  . Alcohol use: No    Alcohol/week: 0.0 oz  . Drug use: No   Family History  Problem Relation Age of Onset  . Cancer Father        prostate cancer   . Diabetes Father   . Stroke Father   . Prostate cancer Father   . Hypertension Mother   . Hyperlipidemia Mother   . Bladder Cancer Mother   . Heart disease Brother   . Diabetes Brother   . Kidney disease Brother   . Cancer Paternal Uncle        prostate cancer  . Kidney cancer Neg Hx    No Known Allergies Current Outpatient Medications on File Prior to Visit  Medication Sig Dispense Refill  . amLODipine (NORVASC) 5 MG tablet Take 1 tablet (5 mg total) by mouth daily. 90 tablet 3  . aspirin 81 MG EC tablet  Take 81 mg by mouth daily.     . Blood Glucose Monitoring Suppl (BAYER CONTOUR NEXT MONITOR) w/Device KIT Check blood sugar daily and as directed. 1 kit 0  . losartan-hydrochlorothiazide (HYZAAR) 100-25 MG tablet TAKE 1 BY MOUTH DAILY 90 tablet 3  . metFORMIN (GLUCOPHAGE) 500 MG tablet TAKE 1 TABLET BY MOUTH TWICE A DAY WITH A MEAL 180 tablet 3  . ONETOUCH VERIO test strip USE TO CHECK BLOOD SUGAR TWICE DAILY FOR DM (DX. E11.9) 100 each 1  . ONETOUCH VERIO test strip USE TO CHECK BLOOD SUGAR TWICE DAILY FOR DM (DX. E11.9) 100 each 5   No current facility-administered medications on file prior to visit.      Review of Systems  Constitutional: Negative for activity change, appetite change, fatigue, fever and unexpected weight change.  HENT: Negative for congestion, rhinorrhea, sore throat and trouble swallowing.   Eyes: Negative for pain, redness, itching and visual disturbance.  Respiratory: Negative for cough, chest tightness, shortness of breath and wheezing.   Cardiovascular: Negative for chest pain and palpitations.  Gastrointestinal: Negative for abdominal pain, blood in stool, constipation, diarrhea and nausea.  Endocrine: Negative for cold intolerance, heat intolerance, polydipsia and polyuria.  Genitourinary: Negative for difficulty urinating, dysuria, frequency and urgency.  Musculoskeletal: Negative for arthralgias, joint swelling and myalgias.  Skin: Negative for pallor and rash.  Neurological: Negative for dizziness, tremors, weakness, numbness and headaches.  Hematological: Negative for adenopathy. Does not bruise/bleed easily.  Psychiatric/Behavioral: Negative for decreased concentration and dysphoric mood. The patient is not nervous/anxious.        Objective:   Physical Exam  Constitutional: He appears well-developed and well-nourished. No distress.  obese and well appearing   HENT:  Head: Normocephalic and atraumatic.  Mouth/Throat: Oropharynx is clear and moist.    Eyes: Conjunctivae and EOM are normal. Pupils are equal, round, and reactive to light.  Neck: Normal range of motion. Neck supple. No JVD present. Carotid bruit is not present. No thyromegaly present.  Cardiovascular: Normal rate, regular rhythm, normal heart sounds and intact distal pulses. Exam reveals no gallop.  Pulmonary/Chest: Effort normal and breath sounds normal. No respiratory distress. He has no wheezes. He has no rales.  No crackles  Abdominal: Soft. Bowel sounds are normal. He exhibits no distension, no abdominal bruit and no mass. There is no tenderness.  Musculoskeletal: He exhibits no edema.  Lymphadenopathy:    He has no cervical adenopathy.  Neurological: He is  alert. He has normal reflexes.  Skin: Skin is warm and dry. No rash noted. No pallor.  Psychiatric: He has a normal mood and affect.          Assessment & Plan:   Problem List Items Addressed This Visit      Cardiovascular and Mediastinum   ESSENTIAL HYPERTENSION, BENIGN    bp in fair control at this time  BP Readings from Last 1 Encounters:  07/30/17 126/72   No changes needed Disc lifstyle change with low sodium diet and exercise  Labs reviewed       Relevant Medications   atorvastatin (LIPITOR) 10 MG tablet     Endocrine   Diabetes type 2, controlled ([place]) - Primary    Lab Results  Component Value Date   HGBA1C 6.6 (H) 07/23/2017   This is up  disc imp of low glycemic diet and wt loss to prevent DM2  Is getting back on track F/u 82mo     Relevant Medications   atorvastatin (LIPITOR) 10 MG tablet     Genitourinary   BPH (benign prostatic hyperplasia)    Recent nl prostate bx Continues to f/u with urology        Other   Hyperbilirubinemia    Stable       Low HDL (under 40)    Disc imp of exercise  Will start low dose atorvastatin      Obesity    Discussed how this problem influences overall health and the risks it imposes  Reviewed plan for weight loss with lower  calorie diet (via better food choices and also portion control or program like weight watchers) and exercise building up to or more than 30 minutes 5 days per week including some aerobic activity         PROSTATE SPECIFIC ANTIGEN, ELEVATED    Nl prostate bx Continues urology f/u      PURE HYPERCHOLESTEROLEMIA    In light of DM-starting low dose atorvastatin  Disc goals for lipids and reasons to control them Rev labs with pt Rev low sat fat diet in detail       Relevant Medications   atorvastatin (LIPITOR) 10 MG tablet

## 2017-07-30 NOTE — Patient Instructions (Addendum)
Until the weather improves for walking- use your elliptical  Work up to a least 30 minutes 5 days per week   Start the generic lipitor for cholesterol-If any questions or side effects let us know Schedule fasting lab in about 6 weeks   Physical in about 6 months

## 2017-08-01 NOTE — Assessment & Plan Note (Signed)
In light of DM-starting low dose atorvastatin  Disc goals for lipids and reasons to control them Rev labs with pt Rev low sat fat diet in detail

## 2017-08-01 NOTE — Assessment & Plan Note (Signed)
bp in fair control at this time  BP Readings from Last 1 Encounters:  07/30/17 126/72   No changes needed Disc lifstyle change with low sodium diet and exercise  Labs reviewed

## 2017-08-01 NOTE — Assessment & Plan Note (Signed)
Discussed how this problem influences overall health and the risks it imposes  Reviewed plan for weight loss with lower calorie diet (via better food choices and also portion control or program like weight watchers) and exercise building up to or more than 30 minutes 5 days per week including some aerobic activity    

## 2017-08-01 NOTE — Assessment & Plan Note (Signed)
Stable

## 2017-08-01 NOTE — Assessment & Plan Note (Signed)
Lab Results  Component Value Date   HGBA1C 6.6 (H) 07/23/2017   This is up  disc imp of low glycemic diet and wt loss to prevent DM2  Is getting back on track F/u 71mo

## 2017-08-01 NOTE — Assessment & Plan Note (Signed)
Nl prostate bx Continues urology f/u

## 2017-08-01 NOTE — Assessment & Plan Note (Signed)
Recent nl prostate bx Continues to f/u with urology

## 2017-08-01 NOTE — Assessment & Plan Note (Signed)
Disc imp of exercise  Will start low dose atorvastatin

## 2017-08-03 MED ORDER — GLUCOSE BLOOD VI STRP: ORAL_STRIP | 5 refills | Status: DC

## 2017-08-03 NOTE — Addendum Note (Signed)
Addended by: Tammi Sou on: 08/03/2017 12:25 PM   Modules accepted: Orders

## 2017-09-05 ENCOUNTER — Telehealth: Payer: Self-pay | Admitting: Family Medicine

## 2017-09-05 DIAGNOSIS — E7800 Pure hypercholesterolemia, unspecified: Secondary | ICD-10-CM

## 2017-09-05 NOTE — Telephone Encounter (Signed)
-----   Message from Ellamae Sia sent at 09/02/2017  8:58 AM EST ----- Regarding: Lab orders for Friday, 3.1.19 Lab orders for a 6 month follow up appt

## 2017-09-10 ENCOUNTER — Other Ambulatory Visit (INDEPENDENT_AMBULATORY_CARE_PROVIDER_SITE_OTHER): Payer: 59

## 2017-09-10 ENCOUNTER — Encounter: Payer: Self-pay | Admitting: *Deleted

## 2017-09-10 DIAGNOSIS — E7800 Pure hypercholesterolemia, unspecified: Secondary | ICD-10-CM

## 2017-09-10 LAB — LIPID PANEL
CHOLESTEROL: 99 mg/dL (ref 0–200)
HDL: 33.9 mg/dL — ABNORMAL LOW (ref >39.00)
LDL CALC: 44 mg/dL (ref 0–99)
NonHDL: 64.7
TRIGLYCERIDES: 106 mg/dL (ref 0.0–149.0)
Total CHOL/HDL Ratio: 3
VLDL: 21.2 mg/dL (ref 0.0–40.0)

## 2017-09-10 LAB — ALT: ALT: 43 U/L (ref 0–53)

## 2017-09-10 LAB — AST: AST: 27 U/L (ref 0–37)

## 2017-09-22 ENCOUNTER — Other Ambulatory Visit: Payer: Self-pay

## 2017-09-22 DIAGNOSIS — R972 Elevated prostate specific antigen [PSA]: Secondary | ICD-10-CM

## 2017-09-24 ENCOUNTER — Other Ambulatory Visit: Payer: 59

## 2017-09-24 DIAGNOSIS — R972 Elevated prostate specific antigen [PSA]: Secondary | ICD-10-CM

## 2017-09-25 LAB — PSA: Prostate Specific Ag, Serum: 13.5 ng/mL — ABNORMAL HIGH (ref 0.0–4.0)

## 2017-09-30 ENCOUNTER — Encounter: Payer: Self-pay | Admitting: Urology

## 2017-09-30 ENCOUNTER — Ambulatory Visit (INDEPENDENT_AMBULATORY_CARE_PROVIDER_SITE_OTHER): Payer: 59 | Admitting: Urology

## 2017-09-30 VITALS — BP 165/82 | HR 85 | Ht 68.0 in | Wt 241.2 lb

## 2017-09-30 DIAGNOSIS — R972 Elevated prostate specific antigen [PSA]: Secondary | ICD-10-CM

## 2017-09-30 NOTE — Progress Notes (Signed)
09/30/2017 8:52 AM   Daniel Lee 1956/06/05 [id number]  Referring provider: Abner Greenspan, MD [address] [place], [place] [address]  Chief Complaint  Patient presents with  . Elevated PSA    HPI: The patient is a 62 year old gentleman with a past medical history of BPH and elevated PSA presents today for elevated PSA evaluation.  1. Elevated PSA PSA was 12.06 in July 2018.  Negative prostate biopsy in September 2018. Inflammation noted. Slight rise in PSA to 13.5 in March 2019.  The patient does have a previous history of prostate biopsy in 2003 and 2005 which were normal per his report.  PSA History: 3/19    13.5 9/18    Negative prostate biopsy 7/18    12.06 12/16  10.86 8/15     5.32 (last PSA while on finasteride) 2013    4.60    2. BPH Has been on finasteride in the past, but he stopped this medication. He does have mild urinary frequency and mild weakening of his stream. He has nocturia 1.  He does note occasionally having to urinate twice per hour.  This does not happen all the time.  He does not find these symptoms bothersome. No current medications for BPH.      PMH: Past Medical History:  Diagnosis Date  . BPH (benign prostatic hypertrophy)   . ED (erectile dysfunction)   . Elevated alanine aminotransferase (ALT) level    suspect fatty liver  . Hyperglycemia   . Hypertension     Surgical History: Past Surgical History:  Procedure Laterality Date  . abd ultrasound  11/2005   negative  . PROSTATE SURGERY  2003   prostate biopsy negative // urologist    Home Medications:  Allergies as of 09/30/2017   No Known Allergies     Medication List        Accurate as of 09/30/17  8:52 AM. Always use your most recent med list.          accu-chek multiclix lancets Use as instructed   amLODipine 5 MG tablet Commonly known as:  NORVASC Take 1 tablet (5 mg total) by mouth daily.   aspirin 81 MG EC tablet Take 81 mg by mouth daily.   atorvastatin 10 MG tablet Commonly known as:  LIPITOR Take 1 tablet (10 mg total) by mouth daily.   BAYER CONTOUR NEXT MONITOR w/Device Kit Check blood sugar daily and as directed.   ACCU-CHEK AVIVA device Use as instructed   glucose blood test strip Commonly known as:  ACCU-CHEK AVIVA PLUS Use as instructed   glucose blood test strip Commonly known as:  ONETOUCH VERIO USE TO CHECK BLOOD SUGAR TWICE DAILY FOR DM (DX. E11.9)   losartan-hydrochlorothiazide 100-25 MG tablet Commonly known as:  HYZAAR TAKE 1 BY MOUTH DAILY   metFORMIN 500 MG tablet Commonly known as:  GLUCOPHAGE TAKE 1 TABLET BY MOUTH TWICE A DAY WITH A MEAL       Allergies: No Known Allergies  Family History: Family History  Problem Relation Age of Onset  . Cancer Father        prostate cancer   . Diabetes Father   . Stroke Father   . Prostate cancer Father   . Hypertension Mother   . Hyperlipidemia Mother   . Bladder Cancer Mother   . Heart disease Brother   . Diabetes Brother   . Kidney disease Brother   . Cancer Paternal Uncle  prostate cancer  . Kidney cancer Neg Hx     Social History:  reports that he quit smoking about 16 years ago. He has never used smokeless tobacco. He reports that he does not drink alcohol or use drugs.  ROS: UROLOGY Frequent Urination?: Yes Hard to postpone urination?: No Burning/pain with urination?: No Get up at night to urinate?: Yes Leakage of urine?: Yes Urine stream starts and stops?: No Trouble starting stream?: No Do you have to strain to urinate?: No Blood in urine?: No Urinary tract infection?: No Sexually transmitted disease?: No Injury to kidneys or bladder?: No Painful intercourse?: No Weak stream?: Yes Erection problems?: No Penile pain?: No  Gastrointestinal Nausea?: No Vomiting?: No Indigestion/heartburn?: No Diarrhea?: No Constipation?: No  Constitutional Fever: No Night sweats?: No Weight loss?: No Fatigue?:  No  Skin Skin rash/lesions?: No Itching?: No  Eyes Blurred vision?: No Double vision?: No  Ears/Nose/Throat Sore throat?: No Sinus problems?: No  Hematologic/Lymphatic Swollen glands?: No Easy bruising?: No  Cardiovascular Leg swelling?: No Chest pain?: No  Respiratory Cough?: No Shortness of breath?: No  Endocrine Excessive thirst?: No  Musculoskeletal Back pain?: No Joint pain?: No  Neurological Headaches?: No Dizziness?: No  Psychologic Depression?: No Anxiety?: No  Physical Exam: BP (!) 165/82 (BP Location: Right Arm, Patient Position: Sitting, Cuff Size: Large)   Pulse 85   Ht _0  (1.727 m)   Wt 241 lb 3.2 oz (109.4 kg)   BMI 36.67 kg/m   Constitutional:  Alert and oriented, No acute distress. HEENT: [place] AT, moist mucus membranes.  Trachea midline, no masses. Cardiovascular: No clubbing, cyanosis, or edema. Respiratory: Normal respiratory effort, no increased work of breathing. GI: Abdomen is soft, nontender, nondistended, no abdominal masses GU: No CVA tenderness.  Skin: No rashes, bruises or suspicious lesions. Lymph: No cervical or inguinal adenopathy. Neurologic: Grossly intact, no focal deficits, moving all 4 extremities. Psychiatric: Normal mood and affect.  Laboratory Data: Lab Results  Component Value Date   WBC 5.6 01/22/2017   HGB 14.8 01/22/2017   HCT 43.0 01/22/2017   MCV 89.8 01/22/2017   PLT 164.0 01/22/2017    Lab Results  Component Value Date   CREATININE 0.90 07/23/2017    Lab Results  Component Value Date   PSA 12.06 (H) 01/22/2017   PSA 10.86 (H) 06/28/2015   PSA 5.32 (H) 02/20/2014    No results found for: TESTOSTERONE  Lab Results  Component Value Date   HGBA1C 6.6 (H) 07/23/2017    Urinalysis    Component Value Date/Time   APPEARANCEUR Clear 02/18/2017 1113   GLUCOSEU Negative 02/18/2017 1113   BILIRUBINUR Negative 02/18/2017 1113   PROTEINUR Negative 02/18/2017 1113   NITRITE Negative  02/18/2017 1113   LEUKOCYTESUR Negative 02/18/2017 1113    Assessment & Plan:    1.  Elevated PSA The patient has now had 3- prostate biopsies.  His PSA did rise approximate one-point the last 8 months.  At this time we discussed options which include repeat PSA, repeat prostate biopsy, and prostate MRI.  This point the patient is elected to repeat his PSA in 6 months.  If his PSA rising again, we will need to consider prostate MRI at that time.  Return in about 6 months (around 04/02/2018) for PSA prior.  Nickie Retort, MD  [facility] [address], [place], [place] [address] [phone]

## 2017-10-01 ENCOUNTER — Ambulatory Visit: Payer: 59

## 2017-11-06 ENCOUNTER — Emergency Department
Admission: EM | Admit: 2017-11-06 | Discharge: 2017-11-06 | Disposition: A | Discharge status: Home / Self Care | Payer: 59 | Attending: Emergency Medicine | Admitting: Emergency Medicine

## 2017-11-06 ENCOUNTER — Other Ambulatory Visit: Payer: Self-pay

## 2017-11-06 ENCOUNTER — Encounter: Payer: Self-pay | Admitting: Emergency Medicine

## 2017-11-06 DIAGNOSIS — R338 Other retention of urine: Secondary | ICD-10-CM

## 2017-11-06 DIAGNOSIS — Z7984 Long term (current) use of oral hypoglycemic drugs: Secondary | ICD-10-CM | POA: Diagnosis not present

## 2017-11-06 DIAGNOSIS — I10 Essential (primary) hypertension: Secondary | ICD-10-CM | POA: Insufficient documentation

## 2017-11-06 DIAGNOSIS — E7800 Pure hypercholesterolemia, unspecified: Secondary | ICD-10-CM | POA: Diagnosis not present

## 2017-11-06 DIAGNOSIS — Z87891 Personal history of nicotine dependence: Secondary | ICD-10-CM | POA: Insufficient documentation

## 2017-11-06 DIAGNOSIS — Z7982 Long term (current) use of aspirin: Secondary | ICD-10-CM | POA: Insufficient documentation

## 2017-11-06 DIAGNOSIS — R339 Retention of urine, unspecified: Secondary | ICD-10-CM | POA: Insufficient documentation

## 2017-11-06 DIAGNOSIS — E119 Type 2 diabetes mellitus without complications: Secondary | ICD-10-CM | POA: Diagnosis not present

## 2017-11-06 DIAGNOSIS — Z79899 Other long term (current) drug therapy: Secondary | ICD-10-CM | POA: Diagnosis not present

## 2017-11-06 LAB — URINALYSIS, COMPLETE (UACMP) WITH MICROSCOPIC
BACTERIA UA: NONE SEEN
Bilirubin Urine: NEGATIVE
Glucose, UA: NEGATIVE mg/dL
Ketones, ur: NEGATIVE mg/dL
Leukocytes, UA: NEGATIVE
Nitrite: NEGATIVE
PROTEIN: NEGATIVE mg/dL
SPECIFIC GRAVITY, URINE: 1.012 (ref 1.005–1.030)
Squamous Epithelial / LPF: NONE SEEN (ref 0–5)
pH: 6 (ref 5.0–8.0)

## 2017-11-06 MED ORDER — LIDOCAINE HCL URETHRAL/MUCOSAL 2 % EX GEL: 1.0000 "application " | Freq: Once | CUTANEOUS | Status: DC

## 2017-11-06 MED ORDER — TAMSULOSIN HCL 0.4 MG PO CAPS: 0.4000 mg | ORAL_CAPSULE | Freq: Every day | ORAL | 0 refills | Status: DC

## 2017-11-06 NOTE — ED Provider Notes (Signed)
[facility] Emergency Department Provider Note  ____________________________________________   First MD Initiated Contact with Patient 11/06/17 1112     (approximate)  I have reviewed the triage vital signs and the nursing notes.   HISTORY  Chief Complaint Urinary Retention   HPI Daniel Lee is a 62 y.o. male comes to the emergency department with 1 day of acute urinary retention.  He does have a history of BPH.  He has been unable to urinate for the past 12 hours or so.  He has had gradually progressive insidious onset now moderate to severe suprapubic discomfort.  Nonradiating.  Nothing seems to make it better or worse.  Past Medical History:  Diagnosis Date  . BPH (benign prostatic hypertrophy)   . ED (erectile dysfunction)   . Elevated alanine aminotransferase (ALT) level    suspect fatty liver  . Hyperglycemia   . Hypertension     Patient Active Problem List   Diagnosis Date Noted  . Obesity 08/28/2013  . Hyperbilirubinemia 07/01/2012  . Routine general medical examination at a health care facility 06/22/2011  . ESSENTIAL HYPERTENSION, BENIGN 04/25/2009  . PURE HYPERCHOLESTEROLEMIA 07/19/2008  . Diabetes type 2, controlled ([place]) 04/16/2008  . Elevated transaminase level 04/16/2008  . Low HDL (under 40) 03/18/2007  . ERECTILE DYSFUNCTION 10/07/2006  . PROSTATE SPECIFIC ANTIGEN, ELEVATED 10/07/2006  . BPH (benign prostatic hyperplasia) 10/07/2006    Past Surgical History:  Procedure Laterality Date  . abd ultrasound  11/2005   negative  . PROSTATE SURGERY  2003   prostate biopsy negative // urologist    Prior to Admission medications   Medication Sig Start Date End Date Taking? Authorizing Provider  amLODipine (NORVASC) 5 MG tablet Take 1 tablet (5 mg total) by mouth daily. 01/26/17   Tower, Wynelle Fanny, MD  aspirin 81 MG EC tablet Take 81 mg by mouth daily.     [provider]  atorvastatin (LIPITOR) 10 MG tablet Take 1  tablet (10 mg total) by mouth daily. 07/30/17   Tower, Wynelle Fanny, MD  Blood Glucose Monitoring Suppl (ACCU-CHEK AVIVA) device Use as instructed 07/30/17 07/30/18  Tower, Wynelle Fanny, MD  Blood Glucose Monitoring Suppl (BAYER CONTOUR NEXT MONITOR) w/Device KIT Check blood sugar daily and as directed. 10/04/15   Tower, Wynelle Fanny, MD  glucose blood (ACCU-CHEK AVIVA PLUS) test strip Use as instructed 07/30/17   Tower, Roque Lias A, MD  glucose blood (ONETOUCH VERIO) test strip USE TO CHECK BLOOD SUGAR TWICE DAILY FOR DM (DX. E11.9) 08/03/17   Tower, Wynelle Fanny, MD  Lancets (ACCU-CHEK MULTICLIX) lancets Use as instructed 07/30/17   Tower, Wynelle Fanny, MD  losartan-hydrochlorothiazide (HYZAAR) 100-25 MG tablet TAKE 1 BY MOUTH DAILY 01/26/17   Tower, Wynelle Fanny, MD  metFORMIN (GLUCOPHAGE) 500 MG tablet TAKE 1 TABLET BY MOUTH TWICE A DAY WITH A MEAL 01/27/17   Tower, Wynelle Fanny, MD  tamsulosin (FLOMAX) 0.4 MG CAPS capsule Take 1 capsule (0.4 mg total) by mouth daily. 11/06/17   Darel Hong, MD    Allergies Patient has no known allergies.  Family History  Problem Relation Age of Onset  . Cancer Father        prostate cancer   . Diabetes Father   . Stroke Father   . Prostate cancer Father   . Hypertension Mother   . Hyperlipidemia Mother   . Bladder Cancer Mother   . Heart disease Brother   . Diabetes Brother   . Kidney disease Brother   .  Cancer Paternal Uncle        prostate cancer  . Kidney cancer Neg Hx     Social History Social History   Tobacco Use  . Smoking status: Former Smoker    Last attempt to quit: 07/13/2001    Years since quitting: 16.3  . Smokeless tobacco: Never Used  Substance Use Topics  . Alcohol use: No    Alcohol/week: 0.0 oz  . Drug use: No    Review of Systems Constitutional: No fever/chills Cardiovascular: Denies chest pain. Respiratory: Denies shortness of breath. Gastrointestinal: No abdominal pain.  No nausea, no vomiting.  No diarrhea.  No constipation. Genitourinary: Negative for  dysuria. Musculoskeletal: Negative for back pain. Neurological: Negative for headaches  ____________________________________________   PHYSICAL EXAM:  VITAL SIGNS: ED Triage Vitals  Enc Vitals Group     BP 11/06/17 1109 (!) 160/82     Pulse Rate 11/06/17 1109 76     Resp 11/06/17 1109 20     Temp 11/06/17 1109 97.6 F (36.4 C)     Temp Source 11/06/17 1109 Oral     SpO2 11/06/17 1109 100 %     Weight 11/06/17 1110 240 lb (108.9 kg)     Height 11/06/17 1110 5' 8"  (1.727 m)     Head Circumference --      Peak Flow --      Pain Score 11/06/17 1110 10     Pain Loc --      Pain Edu? --      Excl. in [place]? --     Constitutional: Alert and oriented x4 appears uncomfortable pacing around the room no diaphoresis speaks full clear sentences Cardiovascular: Normal rate, regular rhythm. Grossly normal heart sounds.  Good peripheral circulation. Respiratory: Normal respiratory effort.  No retractions. Lungs CTAB and moving good air Gastrointestinal: Soft abdomen suprapubic tenderness no peritonitis Musculoskeletal: No lower extremity edema   Neurologic:  Normal speech and language. No gross focal neurologic deficits are appreciated. Skin:  Skin is warm, dry and intact. No rash noted. Psychiatric: Anxious appearing    ____________________________________________   DIFFERENTIAL includes but not limited to  Acute urinary retention, prostatitis, he urinary tract infection, pyelonephritis ____________________________________________   LABS (all labs ordered are listed, but only abnormal results are displayed)  Labs Reviewed  URINALYSIS, COMPLETE (UACMP) WITH MICROSCOPIC - Abnormal; Notable for the following components:      Result Value   Color, Urine YELLOW (*)    APPearance CLEAR (*)    Hgb urine dipstick SMALL (*)    All other components within normal limits  URINE CULTURE    Urinalysis reviewed by me with no acute disease  noted __________________________________________  EKG   ____________________________________________  RADIOLOGY  ____________________________________________   PROCEDURES  Procedure(s) performed: no  Procedures  Critical Care performed: no  Observation: no ____________________________________________   INITIAL IMPRESSION / ASSESSMENT AND PLAN / ED COURSE  Pertinent labs & imaging results that were available during my care of the patient were reviewed by me and considered in my medical decision making (see chart for details).  The patient arrives uncomfortable appearing with acute urinary retention.  Foley catheter placed with near complete resolution of the symptoms.  Urinalysis has been sent.  I will initiate him on Flomax and refer him back to urology.  Patient verbalized understanding and agreement with the plan.      ____________________________________________   FINAL CLINICAL IMPRESSION(S) / ED DIAGNOSES  Final diagnoses:  Acute urinary retention  NEW MEDICATIONS STARTED DURING THIS VISIT:  Discharge Medication List as of 11/06/2017 12:33 PM    START taking these medications   Details  tamsulosin (FLOMAX) 0.4 MG CAPS capsule Take 1 capsule (0.4 mg total) by mouth daily., Starting Sat 11/06/2017, Print         Note:  This document was prepared using Dragon voice recognition software and may include unintentional dictation errors.     Darel Hong, MD 11/09/17 2216

## 2017-11-06 NOTE — ED Notes (Signed)
Pt bladder scan complete, bladder scan reading of 0 obtained

## 2017-11-06 NOTE — ED Notes (Signed)
Pt catheter drainage bag changed to leg bag for discharge.

## 2017-11-06 NOTE — Discharge Instructions (Addendum)
Please begin taking your Flomax every day and keep your Foley catheter in place until you are able to follow-up with your urologist this coming week for a recheck.  Return to the emergency department sooner for any concerns whatsoever.  It was a pleasure to take care of you today, and thank you for coming to our emergency department.  If you have any questions or concerns before leaving please ask the nurse to grab me and I'm more than happy to go through your aftercare instructions again.  If you were prescribed any opioid pain medication today such as Norco, Vicodin, Percocet, morphine, hydrocodone, or oxycodone please make sure you do not drive when you are taking this medication as it can alter your ability to drive safely.  If you have any concerns once you are home that you are not improving or are in fact getting worse before you can make it to your follow-up appointment, please do not hesitate to call 911 and come back for further evaluation.  Darel Hong, MD  Results for orders placed or performed during the hospital encounter of 11/06/17  Urinalysis, Complete w Microscopic  Result Value Ref Range   Color, Urine YELLOW (A) YELLOW   APPearance CLEAR (A) CLEAR   Specific Gravity, Urine 1.012 1.005 - 1.030   pH 6.0 5.0 - 8.0   Glucose, UA NEGATIVE NEGATIVE mg/dL   Hgb urine dipstick SMALL (A) NEGATIVE   Bilirubin Urine NEGATIVE NEGATIVE   Ketones, ur NEGATIVE NEGATIVE mg/dL   Protein, ur NEGATIVE NEGATIVE mg/dL   Nitrite NEGATIVE NEGATIVE   Leukocytes, UA NEGATIVE NEGATIVE   RBC / HPF 6-10 0 - 5 RBC/hpf   WBC, UA 0-5 0 - 5 WBC/hpf   Bacteria, UA NONE SEEN NONE SEEN   Squamous Epithelial / LPF NONE SEEN 0 - 5   Mucus PRESENT

## 2017-11-06 NOTE — ED Triage Notes (Signed)
States has not voided since last night. Hx enlarged prostate. Pacing in pain.

## 2017-11-07 LAB — URINE CULTURE: Culture: NO GROWTH

## 2017-11-08 ENCOUNTER — Telehealth: Payer: Self-pay | Admitting: Urology

## 2017-11-08 NOTE — Telephone Encounter (Signed)
Patient was seen in the ED for retention foley was placed he has an app on 11-12-17 for a V & T Just wanted you to be aware. Will he need further follow up?  Sharyn Lull

## 2017-11-12 ENCOUNTER — Ambulatory Visit: Payer: 59

## 2017-11-12 VITALS — BP 159/81 | HR 98 | Resp 16 | Ht 68.0 in | Wt 235.0 lb

## 2017-11-12 DIAGNOSIS — R338 Other retention of urine: Secondary | ICD-10-CM

## 2017-11-12 NOTE — Progress Notes (Signed)
Catheter Removal  Patient is present today for a catheter removal.  16ml of water was drained from the balloon. A 16FR foley cath was removed from the bladder no complications were noted . Patient tolerated well.  Preformed by: Cristie Hem, CMA  Follow up/ Additional notes: Pt was instructed to drink plenty of fluids (water) and to return to our office for PVR by 1430 if unable to urinate. Pt expressed his understanding, will call us if he has any problems.

## 2017-11-18 ENCOUNTER — Emergency Department
Admission: EM | Admit: 2017-11-18 | Discharge: 2017-11-18 | Disposition: A | Discharge status: Home / Self Care | Payer: 59 | Attending: Emergency Medicine | Admitting: Emergency Medicine

## 2017-11-18 ENCOUNTER — Encounter: Payer: Self-pay | Admitting: Emergency Medicine

## 2017-11-18 DIAGNOSIS — E119 Type 2 diabetes mellitus without complications: Secondary | ICD-10-CM | POA: Insufficient documentation

## 2017-11-18 DIAGNOSIS — Z7984 Long term (current) use of oral hypoglycemic drugs: Secondary | ICD-10-CM | POA: Diagnosis not present

## 2017-11-18 DIAGNOSIS — Z87891 Personal history of nicotine dependence: Secondary | ICD-10-CM | POA: Insufficient documentation

## 2017-11-18 DIAGNOSIS — N390 Urinary tract infection, site not specified: Secondary | ICD-10-CM | POA: Diagnosis not present

## 2017-11-18 DIAGNOSIS — I10 Essential (primary) hypertension: Secondary | ICD-10-CM | POA: Insufficient documentation

## 2017-11-18 DIAGNOSIS — R339 Retention of urine, unspecified: Secondary | ICD-10-CM | POA: Insufficient documentation

## 2017-11-18 DIAGNOSIS — Z7982 Long term (current) use of aspirin: Secondary | ICD-10-CM | POA: Insufficient documentation

## 2017-11-18 DIAGNOSIS — Z79899 Other long term (current) drug therapy: Secondary | ICD-10-CM | POA: Diagnosis not present

## 2017-11-18 LAB — URINALYSIS, COMPLETE (UACMP) WITH MICROSCOPIC
Bilirubin Urine: NEGATIVE
GLUCOSE, UA: NEGATIVE mg/dL
Ketones, ur: NEGATIVE mg/dL
Nitrite: POSITIVE — AB
PH: 6 (ref 5.0–8.0)
Protein, ur: NEGATIVE mg/dL
RBC / HPF: >50 RBC/hpf — ABNORMAL HIGH (ref 0–5)
SPECIFIC GRAVITY, URINE: 1.008 (ref 1.005–1.030)
SQUAMOUS EPITHELIAL / LPF: NONE SEEN (ref 0–5)

## 2017-11-18 MED ORDER — CEPHALEXIN 500 MG PO CAPS: 500.0000 mg | ORAL_CAPSULE | Freq: Three times a day (TID) | ORAL | 0 refills | Status: DC

## 2017-11-18 NOTE — ED Notes (Signed)
ED Provider at bedside. 

## 2017-11-18 NOTE — ED Notes (Signed)
Pt in reporting pain associated with inability to empty bladder. Pt recently required urinary catherization 2 weeks ago for urinary retention. Pt reports an enlarged prostate, coude catheter placed with urine return. Labs sent.

## 2017-11-18 NOTE — ED Triage Notes (Signed)
Pt unable to urinate since last night. Pt reports he was seen 2 weeks ago for same and had a catheter placed and removed this past Friday by urologist. Pt denies any other symptoms other than urinary retention.

## 2017-11-18 NOTE — Discharge Instructions (Signed)
Please follow-up with your urologist in 1 week for catheter removal.  Please take your antibiotics as prescribed for their entire course.  Return to the emergency department for any fever, inability to urinate, or any other symptom personally concerning to yourself.

## 2017-11-18 NOTE — ED Provider Notes (Signed)
[facility] Emergency Department Provider Note  Time seen: 4:09 AM  I have reviewed the triage vital signs and the nursing notes.   HISTORY  Chief Complaint Urinary Retention    HPI Daniel Lee is a 62 y.o. male with a past medical history of BPH, hypertension, diabetes, urinary retention, presents to the emergency department for urinary retention.  According to the patient 2 weeks ago he experienced his first episode of urinary retention.  Had a Foley catheter placed which was removed approximately 5 days ago.  Patient states since the catheter was removed he has noted very cloudy and foul-smelling urine but denies any dysuria.  Denies any nausea vomiting diarrhea or fever.  Patient states at 10:30 PM last night he was able to urinate but did not completely empty his bladder.  He awoke overnight with lower abdominal pain and fullness, unable to urinate.  Patient states significant lower abdominal pain and fullness currently, unable to urinate still.  Bladder scan greater than 500 upon arrival.  Past Medical History:  Diagnosis Date  . BPH (benign prostatic hypertrophy)   . ED (erectile dysfunction)   . Elevated alanine aminotransferase (ALT) level    suspect fatty liver  . Hyperglycemia   . Hypertension     Patient Active Problem List   Diagnosis Date Noted  . Obesity 08/28/2013  . Hyperbilirubinemia 07/01/2012  . Routine general medical examination at a health care facility 06/22/2011  . ESSENTIAL HYPERTENSION, BENIGN 04/25/2009  . PURE HYPERCHOLESTEROLEMIA 07/19/2008  . Diabetes type 2, controlled ([place]) 04/16/2008  . Elevated transaminase level 04/16/2008  . Low HDL (under 40) 03/18/2007  . ERECTILE DYSFUNCTION 10/07/2006  . PROSTATE SPECIFIC ANTIGEN, ELEVATED 10/07/2006  . BPH (benign prostatic hyperplasia) 10/07/2006    Past Surgical History:  Procedure Laterality Date  . abd ultrasound  11/2005   negative  . PROSTATE SURGERY  2003   prostate biopsy negative // urologist    Prior to Admission medications   Medication Sig Start Date End Date Taking? Authorizing Provider  amLODipine (NORVASC) 5 MG tablet Take 1 tablet (5 mg total) by mouth daily. 01/26/17   Tower, Wynelle Fanny, MD  aspirin 81 MG EC tablet Take 81 mg by mouth daily.     [provider]  atorvastatin (LIPITOR) 10 MG tablet Take 1 tablet (10 mg total) by mouth daily. 07/30/17   Tower, Wynelle Fanny, MD  Blood Glucose Monitoring Suppl (ACCU-CHEK AVIVA) device Use as instructed 07/30/17 07/30/18  Tower, Wynelle Fanny, MD  Blood Glucose Monitoring Suppl (BAYER CONTOUR NEXT MONITOR) w/Device KIT Check blood sugar daily and as directed. 10/04/15   Tower, Wynelle Fanny, MD  glucose blood (ACCU-CHEK AVIVA PLUS) test strip Use as instructed 07/30/17   Tower, Roque Lias A, MD  glucose blood (ONETOUCH VERIO) test strip USE TO CHECK BLOOD SUGAR TWICE DAILY FOR DM (DX. E11.9) 08/03/17   Tower, Wynelle Fanny, MD  Lancets (ACCU-CHEK MULTICLIX) lancets Use as instructed 07/30/17   Tower, Wynelle Fanny, MD  losartan-hydrochlorothiazide (HYZAAR) 100-25 MG tablet TAKE 1 BY MOUTH DAILY 01/26/17   Tower, Wynelle Fanny, MD  metFORMIN (GLUCOPHAGE) 500 MG tablet TAKE 1 TABLET BY MOUTH TWICE A DAY WITH A MEAL 01/27/17   Tower, Wynelle Fanny, MD  tamsulosin (FLOMAX) 0.4 MG CAPS capsule Take 1 capsule (0.4 mg total) by mouth daily. 11/06/17   Darel Hong, MD    No Known Allergies  Family History  Problem Relation Age of Onset  . Cancer Father  prostate cancer   . Diabetes Father   . Stroke Father   . Prostate cancer Father   . Hypertension Mother   . Hyperlipidemia Mother   . Bladder Cancer Mother   . Heart disease Brother   . Diabetes Brother   . Kidney disease Brother   . Cancer Paternal Uncle        prostate cancer  . Kidney cancer Neg Hx     Social History Social History   Tobacco Use  . Smoking status: Former Smoker    Last attempt to quit: 07/13/2001    Years since quitting: 16.3  . Smokeless tobacco:  Never Used  Substance Use Topics  . Alcohol use: No    Alcohol/week: 0.0 oz  . Drug use: No    Review of Systems Constitutional: Negative for fever. Eyes: Negative for visual complaints ENT: Negative for recent illness/congestion Cardiovascular: Negative for chest pain. Respiratory: Negative for shortness of breath. Gastrointestinal: Positive for lower abdominal pain and fullness.  Negative for nausea vomiting or diarrhea. Genitourinary: Inability to urinate.  Foul-smelling urine. Musculoskeletal: Negative for musculoskeletal complaints Skin: Negative for skin complaints  Neurological: Negative for headache All other ROS negative  ____________________________________________   PHYSICAL EXAM:  VITAL SIGNS: ED Triage Vitals [11/18/17 0348]  Enc Vitals Group     BP      Pulse      Resp      Temp      Temp src      SpO2      Weight 235 lb (106.6 kg)     Height      Head Circumference      Peak Flow      Pain Score 7     Pain Loc      Pain Edu?      Excl. in [place]?    Constitutional: Alert and oriented. Well appearing, mild distress due to abdominal pain and fullness in the lower abdomen. Eyes: Normal exam ENT   Head: Normocephalic and atraumatic.   Mouth/Throat: Mucous membranes are moist. Cardiovascular: Normal rate, regular rhythm. No murmur Respiratory: Normal respiratory effort without tachypnea nor retractions. Breath sounds are clear Gastrointestinal: Soft with lower abdominal fullness consistent with distended bladder.  Moderate tenderness to this area no upper abdominal tenderness.  No guarding or rebound. Musculoskeletal: Nontender with normal range of motion in all extremities. Neurologic:  Normal speech and language. No gross focal neurologic deficits Skin:  Skin is warm, dry and intact.  Psychiatric: Mood and affect are normal.   ____________________________________________   INITIAL IMPRESSION / ASSESSMENT AND PLAN / ED COURSE  Pertinent labs &  imaging results that were available during my care of the patient were reviewed by me and considered in my medical decision making (see chart for details).  Patient presents to the emergency department for lower abdominal discomfort fullness and inability to urinate consistent with urinary tract infection.  Differential would include urinary tract infection, urinary retention due to BPH, retention due to UTI, retention due to clot.  We will place a Foley catheter with a leg bag.  We will send a urinalysis and urine culture.  Overall the patient appears well he is in mild discomfort at this time due to retention and fullness.  Patient's urinalysis appears to be grossly infected.  We will cover with antibiotics.  We will leave the Foley catheter in place for 7 days have the patient follow-up with urology.  Urine culture has been sent.  Patient states  complete resolution of discomfort after catheter was placed.  ____________________________________________   FINAL CLINICAL IMPRESSION(S) / ED DIAGNOSES  Acute urinary retention Urinary tract infection   Harvest Dark, MD 11/18/17 [id number]

## 2017-11-19 ENCOUNTER — Encounter: Payer: Self-pay | Admitting: Emergency Medicine

## 2017-11-19 ENCOUNTER — Other Ambulatory Visit: Payer: Self-pay

## 2017-11-19 ENCOUNTER — Emergency Department
Admission: EM | Admit: 2017-11-19 | Discharge: 2017-11-19 | Disposition: A | Discharge status: Home / Self Care | Payer: 59 | Attending: Emergency Medicine | Admitting: Emergency Medicine

## 2017-11-19 DIAGNOSIS — I10 Essential (primary) hypertension: Secondary | ICD-10-CM | POA: Diagnosis not present

## 2017-11-19 DIAGNOSIS — T839XXA Unspecified complication of genitourinary prosthetic device, implant and graft, initial encounter: Secondary | ICD-10-CM

## 2017-11-19 DIAGNOSIS — Y829 Unspecified medical devices associated with adverse incidents: Secondary | ICD-10-CM | POA: Insufficient documentation

## 2017-11-19 DIAGNOSIS — Z87891 Personal history of nicotine dependence: Secondary | ICD-10-CM | POA: Diagnosis not present

## 2017-11-19 DIAGNOSIS — Z7982 Long term (current) use of aspirin: Secondary | ICD-10-CM | POA: Diagnosis not present

## 2017-11-19 DIAGNOSIS — Z7984 Long term (current) use of oral hypoglycemic drugs: Secondary | ICD-10-CM | POA: Insufficient documentation

## 2017-11-19 DIAGNOSIS — Z79899 Other long term (current) drug therapy: Secondary | ICD-10-CM | POA: Insufficient documentation

## 2017-11-19 DIAGNOSIS — E119 Type 2 diabetes mellitus without complications: Secondary | ICD-10-CM | POA: Diagnosis not present

## 2017-11-19 DIAGNOSIS — T83098A Other mechanical complication of other indwelling urethral catheter, initial encounter: Secondary | ICD-10-CM | POA: Insufficient documentation

## 2017-11-19 DIAGNOSIS — T83038A Leakage of other indwelling urethral catheter, initial encounter: Secondary | ICD-10-CM | POA: Diagnosis present

## 2017-11-19 NOTE — ED Provider Notes (Signed)
[facility] Emergency Department Provider Note ______________________________________   I have reviewed the triage vital signs and the nursing notes.   HISTORY  Chief Complaint Catheter problem   History limited by: Not Limited   HPI Daniel Lee is a 62 y.o. male who presents to the emergency department today because of concern for leakage around a urinary catheter. Had it placed yesterday because of urinary retention. Stated it had been draining normally until today. Noticed that it stopped draining out to the bag but did start leaking urine around it. Has a little discomfort when he urinates around it. Was started on abx yesterday for UTI. Denies any fevers.   Per medical record review patient has a history of ED visit yesterday for urinary retention.   Past Medical History:  Diagnosis Date  . BPH (benign prostatic hypertrophy)   . ED (erectile dysfunction)   . Elevated alanine aminotransferase (ALT) level    suspect fatty liver  . Hyperglycemia   . Hypertension     Patient Active Problem List   Diagnosis Date Noted  . Obesity 08/28/2013  . Hyperbilirubinemia 07/01/2012  . Routine general medical examination at a health care facility 06/22/2011  . ESSENTIAL HYPERTENSION, BENIGN 04/25/2009  . PURE HYPERCHOLESTEROLEMIA 07/19/2008  . Diabetes type 2, controlled ([place]) 04/16/2008  . Elevated transaminase level 04/16/2008  . Low HDL (under 40) 03/18/2007  . ERECTILE DYSFUNCTION 10/07/2006  . PROSTATE SPECIFIC ANTIGEN, ELEVATED 10/07/2006  . BPH (benign prostatic hyperplasia) 10/07/2006    Past Surgical History:  Procedure Laterality Date  . abd ultrasound  11/2005   negative  . PROSTATE SURGERY  2003   prostate biopsy negative // urologist    Prior to Admission medications   Medication Sig Start Date End Date Taking? Authorizing Provider  amLODipine (NORVASC) 5 MG tablet Take 1 tablet (5 mg total) by mouth daily. 01/26/17   Tower, Wynelle Fanny,  MD  aspirin 81 MG EC tablet Take 81 mg by mouth daily.     [provider]  atorvastatin (LIPITOR) 10 MG tablet Take 1 tablet (10 mg total) by mouth daily. 07/30/17   Tower, Wynelle Fanny, MD  Blood Glucose Monitoring Suppl (ACCU-CHEK AVIVA) device Use as instructed 07/30/17 07/30/18  Tower, Wynelle Fanny, MD  Blood Glucose Monitoring Suppl (BAYER CONTOUR NEXT MONITOR) w/Device KIT Check blood sugar daily and as directed. 10/04/15   Tower, Wynelle Fanny, MD  cephALEXin (KEFLEX) 500 MG capsule Take 1 capsule (500 mg total) by mouth 3 (three) times daily. 11/18/17   Harvest Dark, MD  glucose blood (ACCU-CHEK AVIVA PLUS) test strip Use as instructed 07/30/17   Tower, Roque Lias A, MD  glucose blood (ONETOUCH VERIO) test strip USE TO CHECK BLOOD SUGAR TWICE DAILY FOR DM (DX. E11.9) 08/03/17   Tower, Wynelle Fanny, MD  Lancets (ACCU-CHEK MULTICLIX) lancets Use as instructed 07/30/17   Tower, Wynelle Fanny, MD  losartan-hydrochlorothiazide (HYZAAR) 100-25 MG tablet TAKE 1 BY MOUTH DAILY 01/26/17   Tower, Wynelle Fanny, MD  metFORMIN (GLUCOPHAGE) 500 MG tablet TAKE 1 TABLET BY MOUTH TWICE A DAY WITH A MEAL 01/27/17   Tower, Wynelle Fanny, MD  tamsulosin (FLOMAX) 0.4 MG CAPS capsule Take 1 capsule (0.4 mg total) by mouth daily. 11/06/17   Darel Hong, MD    Allergies Patient has no known allergies.  Family History  Problem Relation Age of Onset  . Cancer Father        prostate cancer   . Diabetes Father   . Stroke Father   .  Prostate cancer Father   . Hypertension Mother   . Hyperlipidemia Mother   . Bladder Cancer Mother   . Heart disease Brother   . Diabetes Brother   . Kidney disease Brother   . Cancer Paternal Uncle        prostate cancer  . Kidney cancer Neg Hx     Social History Social History   Tobacco Use  . Smoking status: Former Smoker    Last attempt to quit: 07/13/2001    Years since quitting: 16.3  . Smokeless tobacco: Never Used  Substance Use Topics  . Alcohol use: No    Alcohol/week: 0.0 oz  . Drug use:  No    Review of Systems Constitutional: No fever/chills Eyes: No visual changes. ENT: No sore throat. Cardiovascular: Denies chest pain. Respiratory: Denies shortness of breath. Gastrointestinal: No abdominal pain.  No nausea, no vomiting.  No diarrhea.   Genitourinary: Positive for urinary catheter problem.  Musculoskeletal: Negative for back pain. Skin: Negative for rash. Neurological: Negative for headaches, focal weakness or numbness.  ____________________________________________   PHYSICAL EXAM:  VITAL SIGNS: ED Triage Vitals [11/19/17 1341]  Enc Vitals Group     BP 137/79     Pulse Rate (!) 108     Resp 16     Temp 98.6 F (37 C)     Temp Source Oral     SpO2 96 %     Weight 235 lb (106.6 kg)     Height 5' 8"  (1.727 m)     Head Circumference      Peak Flow      Pain Score 0   Constitutional: Alert and oriented. Well appearing and in no distress. Eyes: Conjunctivae are normal.  ENT   Head: Normocephalic and atraumatic.   Nose: No congestion/rhinnorhea.   Mouth/Throat: Mucous membranes are moist.   Neck: No stridor. Hematological/Lymphatic/Immunilogical: No cervical lymphadenopathy. Cardiovascular: Normal rate, regular rhythm.  No murmurs, rubs, or gallops.  Respiratory: Normal respiratory effort without tachypnea nor retractions. Breath sounds are clear and equal bilaterally. No wheezes/rales/rhonchi. Gastrointestinal: Soft and non tender. No rebound. No guarding.  Genitourinary: Deferred Musculoskeletal: Normal range of motion in all extremities.  Neurologic:  Normal speech and language. No gross focal neurologic deficits are appreciated.  Skin:  Skin is warm, dry and intact. No rash noted. Psychiatric: Mood and affect are normal. Speech and behavior are normal. Patient exhibits appropriate insight and judgment.  ____________________________________________    LABS (pertinent  positives/negatives)  None  ____________________________________________   EKG  None  ____________________________________________    RADIOLOGY  None  ____________________________________________   PROCEDURES  Procedures  ____________________________________________   INITIAL IMPRESSION / ASSESSMENT AND PLAN / ED COURSE  Pertinent labs & imaging results that were available during my care of the patient were reviewed by me and considered in my medical decision making (see chart for details).  Patient presented to the emergency department today because of concerns for leakage around Foley catheter.  Given that the patient has not been having any drainage at [place] did have concern for obstruction.  Nurses did try to flush and while they could inject the fluid they cannot pull any out.  Patient's catheter was replaced with good drainage.  Discussed importance of urology follow-up.  ____________________________________________   FINAL CLINICAL IMPRESSION(S) / ED DIAGNOSES  Final diagnoses:  Problem with Foley catheter, initial encounter [facility])     Note: This dictation was prepared with Dragon dictation. Any transcriptional errors that result from this  process are unintentional     Nance Pear, MD 11/19/17 1623

## 2017-11-19 NOTE — Discharge Instructions (Addendum)
Please seek medical attention for any high fevers, chest pain, shortness of breath, change in behavior, persistent vomiting, bloody stool or any other new or concerning symptoms.  

## 2017-11-19 NOTE — ED Triage Notes (Signed)
Pt to ED via POV, Pt states that he was seen in the ED last night and had foley placed due to urinary retention. Pt states that this morning the catheter is leaking from insertion site. Pt called urologist who referred him back to ED. Pt in NAD at this time.

## 2017-11-20 LAB — URINE CULTURE

## 2017-11-23 ENCOUNTER — Other Ambulatory Visit: Payer: Self-pay | Admitting: Family Medicine

## 2017-11-23 ENCOUNTER — Ambulatory Visit (INDEPENDENT_AMBULATORY_CARE_PROVIDER_SITE_OTHER): Payer: 59 | Admitting: Family Medicine

## 2017-11-23 ENCOUNTER — Encounter: Payer: Self-pay | Admitting: Family Medicine

## 2017-11-23 ENCOUNTER — Ambulatory Visit
Admission: RE | Admit: 2017-11-23 | Discharge: 2017-11-23 | Disposition: A | Discharge status: Home / Self Care | Payer: 59 | Source: Ambulatory Visit | Attending: Family Medicine | Admitting: Family Medicine

## 2017-11-23 VITALS — BP 132/78 | HR 113 | Temp 98.9°F | Ht 68.0 in | Wt 231.8 lb

## 2017-11-23 DIAGNOSIS — N433 Hydrocele, unspecified: Secondary | ICD-10-CM | POA: Insufficient documentation

## 2017-11-23 DIAGNOSIS — R339 Retention of urine, unspecified: Secondary | ICD-10-CM | POA: Diagnosis not present

## 2017-11-23 DIAGNOSIS — N3000 Acute cystitis without hematuria: Secondary | ICD-10-CM

## 2017-11-23 DIAGNOSIS — N50819 Testicular pain, unspecified: Secondary | ICD-10-CM | POA: Insufficient documentation

## 2017-11-23 DIAGNOSIS — N401 Enlarged prostate with lower urinary tract symptoms: Secondary | ICD-10-CM

## 2017-11-23 DIAGNOSIS — R9389 Abnormal findings on diagnostic imaging of other specified body structures: Secondary | ICD-10-CM | POA: Diagnosis not present

## 2017-11-23 DIAGNOSIS — N390 Urinary tract infection, site not specified: Secondary | ICD-10-CM | POA: Insufficient documentation

## 2017-11-23 LAB — POC URINALSYSI DIPSTICK (AUTOMATED)
BILIRUBIN UA: NEGATIVE
Blood, UA: 200
GLUCOSE UA: NEGATIVE
KETONES UA: NEGATIVE
Nitrite, UA: POSITIVE
PH UA: 6 (ref 5.0–8.0)
Protein, UA: 30
Spec Grav, UA: 1.02 (ref 1.010–1.025)
Urobilinogen, UA: 0.2 E.U./dL

## 2017-11-23 NOTE — Assessment & Plan Note (Addendum)
3 ED visits Indwelling foley and one uti  On flomax Needs urgent f/u with urology- foley is leaking around tube as well  Reviewed hospital records, lab results and studies in detail

## 2017-11-23 NOTE — Assessment & Plan Note (Signed)
Now with urinary retention- 3 ED visits and indwelling foley On flomax Will urgently ref to urology

## 2017-11-23 NOTE — Telephone Encounter (Signed)
Please let pt know that his US shows epididymitis (a common condition) causing inflammation in the tubule around the testicle  We treat this with antibiotics He is already on keflex for uti Go ahead and stop that Start cipro 500 mg bid for 7 days  (if he has another uti this will also help that)  I pended px to send to pharmacy of choice   Thanks

## 2017-11-23 NOTE — Patient Instructions (Signed)
Make sure to drink water - to help prevent more infection  Will do urgent referral to urology   Let's get a urine sample today as well   If symptoms worsen or change in the meantime please let us know

## 2017-11-23 NOTE — Progress Notes (Signed)
Subjective:    Patient ID: Daniel Lee, male    DOB: [birth year], 62 y.o.   MRN: [id number]  HPI Here for f/u of ED visit  4/27 for acute urinary retention -foley placed /flomax started and sent to urology Was seen on 5/9 for urinary retention (foley placed again) -had 500 ml of urine on bladder scan at arrival / was also treated for uti  Then 5/10 to replace foley which was blocked   Most recent urine cx done on 5/9 showed e coli -pan sensitive over 100.000 colonies  UA: Results for Daniel Lee, Daniel Lee (MRN [id number]) as of 11/23/2017 14:08  Ref. Range 11/18/2017 04:09  Appearance Latest Ref Range: CLEAR  HAZY (A)  Bilirubin Urine Latest Ref Range: NEGATIVE  NEGATIVE  Color, Urine Latest Ref Range: YELLOW  YELLOW (A)  Glucose Latest Ref Range: NEGATIVE mg/dL NEGATIVE  Hgb urine dipstick Latest Ref Range: NEGATIVE  LARGE (A)  Ketones, ur Latest Ref Range: NEGATIVE mg/dL NEGATIVE  Leukocytes, UA Latest Ref Range: NEGATIVE  LARGE (A)  Nitrite Latest Ref Range: NEGATIVE  POSITIVE (A)  pH Latest Ref Range: 5.0 - 8.0  6.0  Protein Latest Ref Range: NEGATIVE mg/dL NEGATIVE  Specific Gravity, Urine Latest Ref Range: 1.005 - 1.030  1.008  Bacteria, UA Latest Ref Range: NONE SEEN  RARE (A)  RBC / HPF Latest Ref Range: 0 - 5 RBC/hpf >50 (H)  Squamous Epithelial / LPF Latest Ref Range: 0 - 5  NONE SEEN  WBC Clumps Unknown PRESENT  WBC, UA Latest Ref Range: 0 - 5 WBC/hpf 21-50    Lab Results  Component Value Date   CREATININE 0.90 07/23/2017   BUN 17 07/23/2017   NA 138 07/23/2017   K 3.9 07/23/2017   CL 101 07/23/2017   CO2 31 07/23/2017   Wt Readings from Last 3 Encounters:  11/23/17 231 lb 12 oz (105.1 kg)  11/19/17 235 lb (106.6 kg)  11/18/17 235 lb (106.6 kg)   35.24 kg/m   Lab Results  Component Value Date   PSA 12.06 (H) 01/22/2017   PSA 10.86 (H) 06/28/2015   PSA 5.32 (H) 02/20/2014    He sees Dr Willy Eddy at [facility] urology ( he will be moving to [place] in  [place] he thinks)  Has appt 5/24  Was obs as of last appt   Feels fair right now  Feels like he has to urinate all the time  Some leakage around the tube   Needs a refill of flomax  Keflex for 10 days   Some testicular discomfort   No fever (poss Sunday)-better now  No flank pain  No n/v   Patient Active Problem List   Diagnosis Date Noted  . Urinary retention 11/23/2017  . UTI (urinary tract infection) 11/23/2017  . Obesity 08/28/2013  . Hyperbilirubinemia 07/01/2012  . Routine general medical examination at a health care facility 06/22/2011  . ESSENTIAL HYPERTENSION, BENIGN 04/25/2009  . PURE HYPERCHOLESTEROLEMIA 07/19/2008  . Diabetes type 2, controlled ([place]) 04/16/2008  . Elevated transaminase level 04/16/2008  . Low HDL (under 40) 03/18/2007  . ERECTILE DYSFUNCTION 10/07/2006  . PROSTATE SPECIFIC ANTIGEN, ELEVATED 10/07/2006  . BPH (benign prostatic hyperplasia) 10/07/2006   Past Medical History:  Diagnosis Date  . BPH (benign prostatic hypertrophy)   . ED (erectile dysfunction)   . Elevated alanine aminotransferase (ALT) level    suspect fatty liver  . Hyperglycemia   . Hypertension    Past Surgical History:  Procedure Laterality Date  .  abd ultrasound  11/2005   negative  . PROSTATE SURGERY  2003   prostate biopsy negative // urologist   Social History   Tobacco Use  . Smoking status: Former Smoker    Last attempt to quit: 07/13/2001    Years since quitting: 16.3  . Smokeless tobacco: Never Used  Substance Use Topics  . Alcohol use: No    Alcohol/week: 0.0 oz  . Drug use: No   Family History  Problem Relation Age of Onset  . Cancer Father        prostate cancer   . Diabetes Father   . Stroke Father   . Prostate cancer Father   . Hypertension Mother   . Hyperlipidemia Mother   . Bladder Cancer Mother   . Heart disease Brother   . Diabetes Brother   . Kidney disease Brother   . Cancer Paternal Uncle        prostate cancer  . Kidney cancer  Neg Hx    No Known Allergies Current Outpatient Medications on File Prior to Visit  Medication Sig Dispense Refill  . amLODipine (NORVASC) 5 MG tablet Take 1 tablet (5 mg total) by mouth daily. 90 tablet 3  . aspirin 81 MG EC tablet Take 81 mg by mouth daily.     Marland Kitchen atorvastatin (LIPITOR) 10 MG tablet Take 1 tablet (10 mg total) by mouth daily. 30 tablet 11  . Blood Glucose Monitoring Suppl (ACCU-CHEK AVIVA) device Use as instructed 1 each 0  . Blood Glucose Monitoring Suppl (BAYER CONTOUR NEXT MONITOR) w/Device KIT Check blood sugar daily and as directed. 1 kit 0  . cephALEXin (KEFLEX) 500 MG capsule Take 1 capsule (500 mg total) by mouth 3 (three) times daily. 30 capsule 0  . glucose blood (ACCU-CHEK AVIVA PLUS) test strip Use as instructed 100 each 12  . glucose blood (ONETOUCH VERIO) test strip USE TO CHECK BLOOD SUGAR TWICE DAILY FOR DM (DX. E11.9) 200 each 5  . Lancets (ACCU-CHEK MULTICLIX) lancets Use as instructed 100 each 12  . losartan-hydrochlorothiazide (HYZAAR) 100-25 MG tablet TAKE 1 BY MOUTH DAILY 90 tablet 3  . metFORMIN (GLUCOPHAGE) 500 MG tablet TAKE 1 TABLET BY MOUTH TWICE A DAY WITH A MEAL 180 tablet 3  . tamsulosin (FLOMAX) 0.4 MG CAPS capsule Take 1 capsule (0.4 mg total) by mouth daily. 30 capsule 0   No current facility-administered medications on file prior to visit.     Review of Systems  Constitutional: Positive for fatigue. Negative for activity change, appetite change, fever and unexpected weight change.  HENT: Negative for congestion, rhinorrhea, sore throat and trouble swallowing.   Eyes: Negative for pain, redness, itching and visual disturbance.  Respiratory: Negative for cough, chest tightness, shortness of breath and wheezing.   Cardiovascular: Negative for chest pain and palpitations.  Gastrointestinal: Negative for abdominal pain, blood in stool, constipation, diarrhea and nausea.  Endocrine: Negative for cold intolerance, heat intolerance, polydipsia  and polyuria.  Genitourinary: Positive for difficulty urinating, dysuria and testicular pain. Negative for flank pain, frequency, genital sores, hematuria and urgency.  Musculoskeletal: Negative for arthralgias, joint swelling and myalgias.  Skin: Negative for pallor and rash.  Neurological: Negative for dizziness, tremors, weakness, numbness and headaches.  Hematological: Negative for adenopathy. Does not bruise/bleed easily.  Psychiatric/Behavioral: Negative for decreased concentration and dysphoric mood. The patient is not nervous/anxious.        Objective:   Physical Exam  Constitutional: He appears well-developed and well-nourished. No distress.  Well  appearing   HENT:  Head: Normocephalic and atraumatic.  Eyes: Pupils are equal, round, and reactive to light. Conjunctivae and EOM are normal.  Neck: Normal range of motion. Neck supple.  Cardiovascular: Normal rate, regular rhythm and normal heart sounds.  Pulmonary/Chest: Effort normal and breath sounds normal. No stridor. No respiratory distress.  Abdominal: Soft. Bowel sounds are normal. He exhibits no distension and no mass. There is tenderness. There is no rebound and no guarding.  No cva tenderness  Mild suprapubic tenderness  Genitourinary:  Genitourinary Comments: Foley cath in place  Musculoskeletal: He exhibits no edema.  Lymphadenopathy:    He has no cervical adenopathy.  Neurological: He is alert.  Skin: No rash noted. No pallor.  Psychiatric: He has a normal mood and affect.  Pleasant           Assessment & Plan:   Problem List Items Addressed This Visit      Genitourinary   BPH (benign prostatic hyperplasia)    Now with urinary retention- 3 ED visits and indwelling foley On flomax Will urgently ref to urology      Relevant Orders   Ambulatory referral to Urology   Urinary retention - Primary    3 ED visits Indwelling foley and one uti  On flomax Needs urgent f/u with urology- foley is leaking  around tube as well  Reviewed hospital records, lab results and studies in detail        Relevant Orders   Ambulatory referral to Urology   POCT Urinalysis Dipstick (Automated) (Completed)   Urine Culture   UTI (urinary tract infection)    S/p tx keflex  Foley for urinary retention  E coli-rev his last cx Re check today (still taking keflex)  Enc fluids  Still urinary discomfort with some radiation to testicles Reviewed hospital records, lab results and studies in detail  Ref to urology       Relevant Orders   Ambulatory referral to Urology   POCT Urinalysis Dipstick (Automated) (Completed)   Urine Culture     Other   Testicular pain    Worse lately  Doppler ordered of scrotum F/u with urol      Relevant Orders   US SCROTUM W/DOPPLER (Completed)

## 2017-11-23 NOTE — Assessment & Plan Note (Addendum)
S/p tx keflex  Foley for urinary retention  E coli-rev his last cx Re check today (still taking keflex)  Enc fluids  Still urinary discomfort with some radiation to testicles Reviewed hospital records, lab results and studies in detail  Ref to urology

## 2017-11-24 MED ORDER — CIPROFLOXACIN HCL 500 MG PO TABS: 500.0000 mg | ORAL_TABLET | Freq: Two times a day (BID) | ORAL | 0 refills | Status: DC

## 2017-11-24 NOTE — Assessment & Plan Note (Signed)
Worse lately  Doppler ordered of scrotum F/u with urol

## 2017-11-24 NOTE — Telephone Encounter (Signed)
Pt notified of [place] results and Dr. Marliss Coots comments and instructions. Rx sent to pharmacy

## 2017-11-25 LAB — URINE CULTURE
MICRO NUMBER: 90586233
SPECIMEN QUALITY:: ADEQUATE

## 2017-11-26 ENCOUNTER — Ambulatory Visit: Payer: 59 | Admitting: Family Medicine

## 2017-11-26 DIAGNOSIS — R338 Other retention of urine: Secondary | ICD-10-CM

## 2017-11-26 NOTE — Progress Notes (Signed)
Catheter Removal  Patient is present today for a catheter removal.  59ml of water was drained from the balloon. A 18FR foley cath was removed from the bladder no complications were noted . Patient tolerated well.  Preformed by: Elberta Leatherwood, CMA  Follow up/ Additional notes: today at 2:30 if patient can not urinate

## 2017-11-29 ENCOUNTER — Ambulatory Visit: Payer: 59

## 2017-12-02 ENCOUNTER — Telehealth: Payer: Self-pay | Admitting: Urology

## 2017-12-02 MED ORDER — TAMSULOSIN HCL 0.4 MG PO CAPS: 0.4000 mg | ORAL_CAPSULE | Freq: Every day | ORAL | 0 refills | Status: DC

## 2017-12-02 NOTE — Telephone Encounter (Signed)
Patient's appt on Friday with Dr. Pilar Jarvis had to be rescheduled until 5/30 with Dr. Erlene Quan.  Patient is requesting a refill of Flomax (he was given a Rx in the ER) to be sent to [facility] on [address] to get him through until his appt.  Please call him with any concerns at [phone].

## 2017-12-02 NOTE — Addendum Note (Signed)
Addended by: Kyra Manges on: 12/02/2017 10:41 AM   Modules accepted: Orders

## 2017-12-03 ENCOUNTER — Ambulatory Visit: Payer: 59

## 2017-12-04 ENCOUNTER — Emergency Department
Admission: EM | Admit: 2017-12-04 | Discharge: 2017-12-04 | Disposition: A | Discharge status: Home / Self Care | Payer: 59 | Attending: Emergency Medicine | Admitting: Emergency Medicine

## 2017-12-04 ENCOUNTER — Other Ambulatory Visit: Payer: Self-pay

## 2017-12-04 DIAGNOSIS — Z7982 Long term (current) use of aspirin: Secondary | ICD-10-CM | POA: Insufficient documentation

## 2017-12-04 DIAGNOSIS — I10 Essential (primary) hypertension: Secondary | ICD-10-CM | POA: Insufficient documentation

## 2017-12-04 DIAGNOSIS — Z7984 Long term (current) use of oral hypoglycemic drugs: Secondary | ICD-10-CM | POA: Insufficient documentation

## 2017-12-04 DIAGNOSIS — R1030 Lower abdominal pain, unspecified: Secondary | ICD-10-CM | POA: Diagnosis not present

## 2017-12-04 DIAGNOSIS — E119 Type 2 diabetes mellitus without complications: Secondary | ICD-10-CM | POA: Insufficient documentation

## 2017-12-04 DIAGNOSIS — Z87891 Personal history of nicotine dependence: Secondary | ICD-10-CM | POA: Insufficient documentation

## 2017-12-04 DIAGNOSIS — R339 Retention of urine, unspecified: Secondary | ICD-10-CM | POA: Insufficient documentation

## 2017-12-04 DIAGNOSIS — R338 Other retention of urine: Secondary | ICD-10-CM

## 2017-12-04 LAB — URINALYSIS, COMPLETE (UACMP) WITH MICROSCOPIC
BACTERIA UA: NONE SEEN
Bilirubin Urine: NEGATIVE
GLUCOSE, UA: NEGATIVE mg/dL
Ketones, ur: NEGATIVE mg/dL
Leukocytes, UA: NEGATIVE
Nitrite: NEGATIVE
PH: 6 (ref 5.0–8.0)
Protein, ur: NEGATIVE mg/dL
SQUAMOUS EPITHELIAL / LPF: NONE SEEN (ref 0–5)
Specific Gravity, Urine: 1.005 (ref 1.005–1.030)

## 2017-12-04 NOTE — Discharge Instructions (Signed)
You were seen in the Emergency Department (ED) for urinary retention.  We placed a Foley catheter to help your bladder drain.  Please read through the included information and follow up with your doctor as recommended in these papers; your doctor will see you in clinic and help you determine when it is time to have the catheter removed.  If you stop producing urine in the bag or if you develop other symptoms that concern you, such as fever, chills, persistent vomiting, or severe abdominal pain, please return immediately to the Emergency Department.  ° °

## 2017-12-04 NOTE — ED Provider Notes (Signed)
[facility] Emergency Department Provider Note  ____________________________________________   First MD Initiated Contact with Patient 12/04/17 0510     (approximate)  I have reviewed the triage vital signs and the nursing notes.   HISTORY  Chief Complaint Urinary Retention    HPI Daniel Lee is a 62 y.o. male with a history of BPH and prior episodes of acute urinary retention requiring a Foley catheter and leg bag who presents by private vehicle for evaluation of urinary retention and pressure in his lower abdomen.  Reports that he started having some trouble urinating yesterday afternoon and then it became severe during the night.  Symptoms were gradual in onset.  Now he can urinate at all and is in a severe amount of distress upon arrival.  Nothing particular makes it better or worse.  He has an appointment with urology and for 5 days anyway.  He denies fever/chills, chest pain, nausea, vomiting, and abdominal pain except for the feeling of fullness and distention in the lower abdomen.  He has not noticed any blood in his urine recently although he thinks that the urine has been somewhat foul-smelling recently.  As soon as he was placed in exam room a Foley catheter was placed providing immediate release more than 500 mL of urine output.  Past Medical History:  Diagnosis Date  . BPH (benign prostatic hypertrophy)   . ED (erectile dysfunction)   . Elevated alanine aminotransferase (ALT) level    suspect fatty liver  . Hyperglycemia   . Hypertension     Patient Active Problem List   Diagnosis Date Noted  . Urinary retention 11/23/2017  . UTI (urinary tract infection) 11/23/2017  . Testicular pain 11/23/2017  . Obesity 08/28/2013  . Hyperbilirubinemia 07/01/2012  . Routine general medical examination at a health care facility 06/22/2011  . ESSENTIAL HYPERTENSION, BENIGN 04/25/2009  . PURE HYPERCHOLESTEROLEMIA 07/19/2008  . Diabetes type 2,  controlled ([place]) 04/16/2008  . Elevated transaminase level 04/16/2008  . Low HDL (under 40) 03/18/2007  . ERECTILE DYSFUNCTION 10/07/2006  . PROSTATE SPECIFIC ANTIGEN, ELEVATED 10/07/2006  . BPH (benign prostatic hyperplasia) 10/07/2006    Past Surgical History:  Procedure Laterality Date  . abd ultrasound  11/2005   negative  . PROSTATE SURGERY  2003   prostate biopsy negative // urologist    Prior to Admission medications   Medication Sig Start Date End Date Taking? Authorizing Provider  amLODipine (NORVASC) 5 MG tablet Take 1 tablet (5 mg total) by mouth daily. 01/26/17   Tower, Wynelle Fanny, MD  aspirin 81 MG EC tablet Take 81 mg by mouth daily.     [provider]  atorvastatin (LIPITOR) 10 MG tablet Take 1 tablet (10 mg total) by mouth daily. 07/30/17   Tower, Wynelle Fanny, MD  Blood Glucose Monitoring Suppl (ACCU-CHEK AVIVA) device Use as instructed 07/30/17 07/30/18  Tower, Wynelle Fanny, MD  Blood Glucose Monitoring Suppl (BAYER CONTOUR NEXT MONITOR) w/Device KIT Check blood sugar daily and as directed. 10/04/15   Tower, Wynelle Fanny, MD  cephALEXin (KEFLEX) 500 MG capsule Take 1 capsule (500 mg total) by mouth 3 (three) times daily. 11/18/17   Harvest Dark, MD  ciprofloxacin (CIPRO) 500 MG tablet Take 1 tablet (500 mg total) by mouth 2 (two) times daily. With food 11/24/17   Tower, [place] A, MD  glucose blood (ACCU-CHEK AVIVA PLUS) test strip Use as instructed 07/30/17   Tower, Roque Lias A, MD  glucose blood (ONETOUCH VERIO) test strip USE TO  CHECK BLOOD SUGAR TWICE DAILY FOR DM (DX. E11.9) 08/03/17   Tower, Wynelle Fanny, MD  Lancets (ACCU-CHEK MULTICLIX) lancets Use as instructed 07/30/17   Tower, Wynelle Fanny, MD  losartan-hydrochlorothiazide (HYZAAR) 100-25 MG tablet TAKE 1 BY MOUTH DAILY 01/26/17   Tower, Wynelle Fanny, MD  metFORMIN (GLUCOPHAGE) 500 MG tablet TAKE 1 TABLET BY MOUTH TWICE A DAY WITH A MEAL 01/27/17   Tower, Wynelle Fanny, MD  tamsulosin (FLOMAX) 0.4 MG CAPS capsule Take 1 capsule (0.4 mg total) by  mouth daily. 12/02/17   Hollice Espy, MD    Allergies Patient has no known allergies.  Family History  Problem Relation Age of Onset  . Cancer Father        prostate cancer   . Diabetes Father   . Stroke Father   . Prostate cancer Father   . Hypertension Mother   . Hyperlipidemia Mother   . Bladder Cancer Mother   . Heart disease Brother   . Diabetes Brother   . Kidney disease Brother   . Cancer Paternal Uncle        prostate cancer  . Kidney cancer Neg Hx     Social History Social History   Tobacco Use  . Smoking status: Former Smoker    Last attempt to quit: 07/13/2001    Years since quitting: 16.4  . Smokeless tobacco: Never Used  Substance Use Topics  . Alcohol use: No    Alcohol/week: 0.0 oz  . Drug use: No    Review of Systems Constitutional: No fever/chills Eyes: No visual changes. ENT: No sore throat. Cardiovascular: Denies chest pain. Respiratory: Denies shortness of breath. Gastrointestinal: Lower abdominal distention and fullness and pain, no nausea nor vomiting Genitourinary: Acute urinary retention.  Negative for hematuria, negative for dysuria. Musculoskeletal: Negative for neck pain.  Negative for back pain. Integumentary: Negative for rash. Neurological: Negative for headaches, focal weakness or numbness.   ____________________________________________   PHYSICAL EXAM:  VITAL SIGNS: ED Triage Vitals  Enc Vitals Group     BP 12/04/17 0435 (!) 151/83     Pulse Rate 12/04/17 0435 77     Resp 12/04/17 0435 16     Temp 12/04/17 0435 97.6 F (36.4 C)     Temp Source 12/04/17 0435 Oral     SpO2 12/04/17 0435 99 %     Weight --      Height --      Head Circumference --      Peak Flow --      Pain Score 12/04/17 0418 10     Pain Loc --      Pain Edu? --      Excl. in [place]? --     Constitutional: Alert and oriented. Well appearing and in no acute distress. Eyes: Conjunctivae are normal.  Head: Atraumatic. Nose: No  congestion/rhinnorhea. Mouth/Throat: Mucous membranes are moist. Neck: No stridor.  No meningeal signs.   Cardiovascular: Normal rate, regular rhythm. Good peripheral circulation. Grossly normal heart sounds. Respiratory: Normal respiratory effort.  No retractions. Lungs CTAB. Gastrointestinal: Soft and nontender. No distention.  Genitourinary: Foley catheter in place draining clear yellowish urine, no gross hematuria Musculoskeletal: No lower extremity tenderness nor edema. No gross deformities of extremities. Neurologic:  Normal speech and language. No gross focal neurologic deficits are appreciated.  Skin:  Skin is warm, dry and intact. No rash noted. Psychiatric: Mood and affect are normal. Speech and behavior are normal.  ____________________________________________   LABS (all labs ordered are listed,  but only abnormal results are displayed)  Labs Reviewed  URINALYSIS, COMPLETE (UACMP) WITH MICROSCOPIC - Abnormal; Notable for the following components:      Result Value   Color, Urine STRAW (*)    APPearance CLEAR (*)    Hgb urine dipstick SMALL (*)    All other components within normal limits  URINE CULTURE   ____________________________________________  EKG  None - EKG not ordered by ED physician ____________________________________________  RADIOLOGY   ED MD interpretation: No indication for imaging  Official radiology report(s): No results found.  ____________________________________________   PROCEDURES  Critical Care performed: No   Procedure(s) performed:   Procedures   ____________________________________________   INITIAL IMPRESSION / ASSESSMENT AND PLAN / ED COURSE  As part of my medical decision making, I reviewed the following data within the [place] notes reviewed and incorporated, Labs reviewed , Old chart reviewed and Notes from prior ED visits    Differential diagnosis includes, but is not limited to,  acute obstructive urinary retention secondary to BPH, UTI, hematuria leading to clots, etc.  The patient had a complete resolution of symptoms after Foley catheter placement and he has had similar issues in the past.  No evidence of infection in the urinalysis and urine cultures pending.  Vital signs are stable and within normal limits.  Patient is comfortable with the plan for discharge with a Foley catheter and outpatient follow-up with urology as already scheduled.  I gave my usual and customary return precautions.      ____________________________________________  FINAL CLINICAL IMPRESSION(S) / ED DIAGNOSES  Final diagnoses:  Acute urinary retention     MEDICATIONS GIVEN DURING THIS VISIT:  Medications - No data to display   ED Discharge Orders    None       Note:  This document was prepared using Dragon voice recognition software and may include unintentional dictation errors.    Hinda Kehr, MD 12/04/17 [phone]

## 2017-12-04 NOTE — ED Triage Notes (Signed)
Patient reports difficulty urinating since last pm

## 2017-12-05 LAB — URINE CULTURE: Culture: NO GROWTH

## 2017-12-08 ENCOUNTER — Encounter: Payer: Self-pay | Admitting: Urology

## 2017-12-08 ENCOUNTER — Encounter

## 2017-12-08 ENCOUNTER — Ambulatory Visit (INDEPENDENT_AMBULATORY_CARE_PROVIDER_SITE_OTHER): Payer: 59 | Admitting: Urology

## 2017-12-08 VITALS — BP 152/101 | HR 123 | Ht 68.0 in | Wt 230.0 lb

## 2017-12-08 DIAGNOSIS — N401 Enlarged prostate with lower urinary tract symptoms: Secondary | ICD-10-CM | POA: Diagnosis not present

## 2017-12-08 DIAGNOSIS — R338 Other retention of urine: Secondary | ICD-10-CM

## 2017-12-08 DIAGNOSIS — N138 Other obstructive and reflux uropathy: Secondary | ICD-10-CM | POA: Diagnosis not present

## 2017-12-08 DIAGNOSIS — R972 Elevated prostate specific antigen [PSA]: Secondary | ICD-10-CM | POA: Diagnosis not present

## 2017-12-08 MED ORDER — FINASTERIDE 5 MG PO TABS: 5.0000 mg | ORAL_TABLET | Freq: Every day | ORAL | 2 refills | Status: DC

## 2017-12-08 MED ORDER — TAMSULOSIN HCL 0.4 MG PO CAPS: 0.4000 mg | ORAL_CAPSULE | Freq: Every day | ORAL | 11 refills | Status: DC

## 2017-12-08 NOTE — H&P (View-Only) (Signed)
12/08/2017 10:41 AM   Daniel Lee 1956/03/19 [id number]  Referring provider: Abner Greenspan, MD [address] [place], [place] [address]  Chief Complaint  Patient presents with  . Urinary Retention    HPI: 62 year old male who presents today with ongoing urinary retention.  He has a personal history of massive prostamegaly (TRUS volume 144 cc 03/2017) and elevated PSA.  3 previous negative prostate biopsies with a persistently elevated and slowly rising PSA, most recently to 13.5 managed by Dr. Pilar Jarvis.  He reports that about a month ago, he developed difficulty voiding at which time he was noted to be in urinary retention a Foley catheter was placed.  He has had at least 2 voiding trials which he was initially able to void but ultimately ended up needing to have the catheter replaced about 1 week later.  He is on numerous ED evaluations both for retention as well as for catheter issues.  He currently has an indwelling Foley in place that was replaced 5/25.    He is also been treated for several urinary tract infections cultures growing E. Coli and Pseudomonas.  He was also seen and evaluated by his primary care physician and underwent scrotal ultrasound for scrotal swelling treated for an epididymitis.  He is not currently on any antibiotics.  Prior to the above, he is not on any BPH medications.  Flomax which he continues at this time.  He was remotely on finasteride but has not been taking this for several years.  He presents today to discuss Foley catheter management as well as further management of his ongoing urinary retention.   PMH: Past Medical History:  Diagnosis Date  . BPH (benign prostatic hypertrophy)   . ED (erectile dysfunction)   . Elevated alanine aminotransferase (ALT) level    suspect fatty liver  . Hyperglycemia   . Hypertension     Surgical History: Past Surgical History:  Procedure Laterality Date  . abd ultrasound  11/2005   negative  .  PROSTATE SURGERY  2003   prostate biopsy negative // urologist    Home Medications:  Allergies as of 12/08/2017   No Known Allergies     Medication List        Accurate as of 12/08/17 10:41 AM. Always use your most recent med list.          accu-chek multiclix lancets Use as instructed   amLODipine 5 MG tablet Commonly known as:  NORVASC Take 1 tablet (5 mg total) by mouth daily.   aspirin 81 MG EC tablet Take 81 mg by mouth daily.   atorvastatin 10 MG tablet Commonly known as:  LIPITOR Take 1 tablet (10 mg total) by mouth daily.   BAYER CONTOUR NEXT MONITOR w/Device Kit Check blood sugar daily and as directed.   ACCU-CHEK AVIVA device Use as instructed   finasteride 5 MG tablet Commonly known as:  PROSCAR Take 1 tablet (5 mg total) by mouth daily.   glucose blood test strip Commonly known as:  ACCU-CHEK AVIVA PLUS Use as instructed   glucose blood test strip Commonly known as:  ONETOUCH VERIO USE TO CHECK BLOOD SUGAR TWICE DAILY FOR DM (DX. E11.9)   losartan-hydrochlorothiazide 100-25 MG tablet Commonly known as:  HYZAAR TAKE 1 BY MOUTH DAILY   metFORMIN 500 MG tablet Commonly known as:  GLUCOPHAGE TAKE 1 TABLET BY MOUTH TWICE A DAY WITH A MEAL   tamsulosin 0.4 MG Caps capsule Commonly known as:  FLOMAX Take 1  capsule (0.4 mg total) by mouth daily.       Allergies: No Known Allergies  Family History: Family History  Problem Relation Age of Onset  . Cancer Father        prostate cancer   . Diabetes Father   . Stroke Father   . Prostate cancer Father   . Hypertension Mother   . Hyperlipidemia Mother   . Bladder Cancer Mother   . Heart disease Brother   . Diabetes Brother   . Kidney disease Brother   . Cancer Paternal Uncle        prostate cancer  . Kidney cancer Neg Hx     Social History:  reports that he quit smoking about 16 years ago. He has never used smokeless tobacco. He reports that he does not drink alcohol or use  drugs.  ROS: UROLOGY Frequent Urination?: No Hard to postpone urination?: No Burning/pain with urination?: No Get up at night to urinate?: No Leakage of urine?: No Urine stream starts and stops?: No Trouble starting stream?: No Do you have to strain to urinate?: No Blood in urine?: No Urinary tract infection?: No Sexually transmitted disease?: No Injury to kidneys or bladder?: No Painful intercourse?: No Weak stream?: No Erection problems?: No Penile pain?: No  Gastrointestinal Nausea?: No Vomiting?: No Indigestion/heartburn?: No Diarrhea?: No Constipation?: No  Constitutional Fever: No Night sweats?: No Weight loss?: No Fatigue?: No  Skin Skin rash/lesions?: No Itching?: No  Eyes Blurred vision?: No Double vision?: No  Ears/Nose/Throat Sore throat?: No Sinus problems?: No  Hematologic/Lymphatic Swollen glands?: No Easy bruising?: No  Cardiovascular Leg swelling?: No Chest pain?: No  Respiratory Cough?: No Shortness of breath?: No  Endocrine Excessive thirst?: No  Musculoskeletal Back pain?: No Joint pain?: No  Neurological Headaches?: No Dizziness?: No  Psychologic Depression?: No Anxiety?: No  Physical Exam: BP (!) 152/101   Pulse (!) 123   Ht 5' 8"  (1.727 m)   Wt 230 lb (104.3 kg)   BMI 34.97 kg/m   Constitutional:  Alert and oriented, No acute distress.  Accompanied by wife today. HEENT: [place] AT, moist mucus membranes.  Trachea midline, no masses. Cardiovascular: No clubbing, cyanosis, or edema. Respiratory: Normal respiratory effort, no increased work of breathing. GI: Abdomen is soft, nontender, nondistended, no abdominal masses GU: Foley catheter in place. Skin: No rashes, bruises or suspicious lesions. Neurologic: Grossly intact, no focal deficits, moving all 4 extremities. Psychiatric: Normal mood and affect.  Laboratory Data: Lab Results  Component Value Date   WBC 5.6 01/22/2017   HGB 14.8 01/22/2017   HCT 43.0  01/22/2017   MCV 89.8 01/22/2017   PLT 164.0 01/22/2017    Lab Results  Component Value Date   CREATININE 0.90 07/23/2017    Lab Results  Component Value Date   PSA 12.06 (H) 01/22/2017   PSA 10.86 (H) 06/28/2015   PSA 5.32 (H) 02/20/2014     Lab Results  Component Value Date   HGBA1C 6.6 (H) 07/23/2017    Urinalysis    Component Value Date/Time   COLORURINE STRAW (A) 12/04/2017 0451   APPEARANCEUR CLEAR (A) 12/04/2017 0451   APPEARANCEUR Clear 02/18/2017 1113   LABSPEC 1.005 12/04/2017 0451   PHURINE 6.0 12/04/2017 0451   GLUCOSEU NEGATIVE 12/04/2017 0451   HGBUR SMALL (A) 12/04/2017 0451   BILIRUBINUR NEGATIVE 12/04/2017 0451   BILIRUBINUR Negative 11/23/2017 1440   BILIRUBINUR Negative 02/18/2017 [place] 12/04/2017 0451   PROTEINUR NEGATIVE 12/04/2017 0451  UROBILINOGEN 0.2 11/23/2017 1440   NITRITE NEGATIVE 12/04/2017 0451   LEUKOCYTESUR NEGATIVE 12/04/2017 0451   LEUKOCYTESUR Negative 02/18/2017 1113    Lab Results  Component Value Date   BACTERIA NONE SEEN 12/04/2017    Pertinent Imaging: Scrotal ultrasound from 11/23/2017 reviewed, agree with assessment of increased vascularity of the epididymis, right greater than left with a reactive right hydrocele.  Assessment & Plan:    1. Acute urinary retention Status post multiple failed voiding trials despite Flomax Complicated by UTI/epididymitis We discussed management options for his Foley catheter today, favor removing Foley and teaching clean intermittent catheterization for the time being which the patient was agreeable to He will need a coud tip given his history of massive BPH Start with clean intermittent cath 3-4 times a day, titrate up or down as needed See nursing note for details  2. BPH with urinary obstruction Continue Flomax We will add finasteride Lengthy discussion today regarding his ongoing urinary retention, would be best served with an outlet procedure Given his  prostamegaly with a prostate well over 100 g, he will need to be best served with a robotic simple prostatectomy versus open simple prostatectomy versus holmium laser enucleation of the prostate He is most interested in a minimally invasive option in the form of a holmium laser enucleation of the prostate We discussed the procedure itself today at length including preoperative, intraoperative, postoperative course We discussed the risks of the procedure including risk of bleeding, infection, failure to resolve his retention, irritative voiding symptoms postoperatively, retrograde ejaculation, incontinence including stress and urge incontinence postop amongst other comp occasions He understands that he will need a Foley catheter for 1 to 2 days following the procedure which is generally outpatient Prior to booking this procedure, would like to assess his anatomy with office cystoscopy, patient was agreeable to this and will be scheduled for next week for this evaluation  3. Elevated PSA Personal history of elevated PSA status post multiple previous negative biopsies including most recent negative prostate biopsy less than 1 year ago He did have an appropriate doubling in his PSA after stopping finasteride which accounts for this appropriate jump in his absolute number Minimal concern for prostate cancer at this point, will likely proceed with outlet procedure and continue to follow PSA   Return next week for cystoscopy  Hollice Espy, MD  [facility] [address] [place], [place] [address] [phone]  I spent 25 min with this patient of which greater than 50% was spent in counseling and coordination of care with the patient.

## 2017-12-08 NOTE — Progress Notes (Signed)
12/08/2017 10:41 AM   Daniel Lee 06-Jan-1956 [id number]  Referring provider: Abner Greenspan, MD [address] [place], [place] [address]  Chief Complaint  Patient presents with  . Urinary Retention    HPI: 62 year old male who presents today with ongoing urinary retention.  He has a personal history of massive prostamegaly (TRUS volume 144 cc 03/2017) and elevated PSA.  3 previous negative prostate biopsies with a persistently elevated and slowly rising PSA, most recently to 13.5 managed by Dr. Pilar Jarvis.  He reports that about a month ago, he developed difficulty voiding at which time he was noted to be in urinary retention a Foley catheter was placed.  He has had at least 2 voiding trials which he was initially able to void but ultimately ended up needing to have the catheter replaced about 1 week later.  He is on numerous ED evaluations both for retention as well as for catheter issues.  He currently has an indwelling Foley in place that was replaced 5/25.    He is also been treated for several urinary tract infections cultures growing E. Coli and Pseudomonas.  He was also seen and evaluated by his primary care physician and underwent scrotal ultrasound for scrotal swelling treated for an epididymitis.  He is not currently on any antibiotics.  Prior to the above, he is not on any BPH medications.  Flomax which he continues at this time.  He was remotely on finasteride but has not been taking this for several years.  He presents today to discuss Foley catheter management as well as further management of his ongoing urinary retention.   PMH: Past Medical History:  Diagnosis Date  . BPH (benign prostatic hypertrophy)   . ED (erectile dysfunction)   . Elevated alanine aminotransferase (ALT) level    suspect fatty liver  . Hyperglycemia   . Hypertension     Surgical History: Past Surgical History:  Procedure Laterality Date  . abd ultrasound  11/2005   negative  .  PROSTATE SURGERY  2003   prostate biopsy negative // urologist    Home Medications:  Allergies as of 12/08/2017   No Known Allergies     Medication List        Accurate as of 12/08/17 10:41 AM. Always use your most recent med list.          accu-chek multiclix lancets Use as instructed   amLODipine 5 MG tablet Commonly known as:  NORVASC Take 1 tablet (5 mg total) by mouth daily.   aspirin 81 MG EC tablet Take 81 mg by mouth daily.   atorvastatin 10 MG tablet Commonly known as:  LIPITOR Take 1 tablet (10 mg total) by mouth daily.   BAYER CONTOUR NEXT MONITOR w/Device Kit Check blood sugar daily and as directed.   ACCU-CHEK AVIVA device Use as instructed   finasteride 5 MG tablet Commonly known as:  PROSCAR Take 1 tablet (5 mg total) by mouth daily.   glucose blood test strip Commonly known as:  ACCU-CHEK AVIVA PLUS Use as instructed   glucose blood test strip Commonly known as:  ONETOUCH VERIO USE TO CHECK BLOOD SUGAR TWICE DAILY FOR DM (DX. E11.9)   losartan-hydrochlorothiazide 100-25 MG tablet Commonly known as:  HYZAAR TAKE 1 BY MOUTH DAILY   metFORMIN 500 MG tablet Commonly known as:  GLUCOPHAGE TAKE 1 TABLET BY MOUTH TWICE A DAY WITH A MEAL   tamsulosin 0.4 MG Caps capsule Commonly known as:  FLOMAX Take 1  capsule (0.4 mg total) by mouth daily.       Allergies: No Known Allergies  Family History: Family History  Problem Relation Age of Onset  . Cancer Father        prostate cancer   . Diabetes Father   . Stroke Father   . Prostate cancer Father   . Hypertension Mother   . Hyperlipidemia Mother   . Bladder Cancer Mother   . Heart disease Brother   . Diabetes Brother   . Kidney disease Brother   . Cancer Paternal Uncle        prostate cancer  . Kidney cancer Neg Hx     Social History:  reports that he quit smoking about 16 years ago. He has never used smokeless tobacco. He reports that he does not drink alcohol or use  drugs.  ROS: UROLOGY Frequent Urination?: No Hard to postpone urination?: No Burning/pain with urination?: No Get up at night to urinate?: No Leakage of urine?: No Urine stream starts and stops?: No Trouble starting stream?: No Do you have to strain to urinate?: No Blood in urine?: No Urinary tract infection?: No Sexually transmitted disease?: No Injury to kidneys or bladder?: No Painful intercourse?: No Weak stream?: No Erection problems?: No Penile pain?: No  Gastrointestinal Nausea?: No Vomiting?: No Indigestion/heartburn?: No Diarrhea?: No Constipation?: No  Constitutional Fever: No Night sweats?: No Weight loss?: No Fatigue?: No  Skin Skin rash/lesions?: No Itching?: No  Eyes Blurred vision?: No Double vision?: No  Ears/Nose/Throat Sore throat?: No Sinus problems?: No  Hematologic/Lymphatic Swollen glands?: No Easy bruising?: No  Cardiovascular Leg swelling?: No Chest pain?: No  Respiratory Cough?: No Shortness of breath?: No  Endocrine Excessive thirst?: No  Musculoskeletal Back pain?: No Joint pain?: No  Neurological Headaches?: No Dizziness?: No  Psychologic Depression?: No Anxiety?: No  Physical Exam: BP (!) 152/101   Pulse (!) 123   Ht 5' 8"  (1.727 m)   Wt 230 lb (104.3 kg)   BMI 34.97 kg/m   Constitutional:  Alert and oriented, No acute distress.  Accompanied by wife today. HEENT: [place] AT, moist mucus membranes.  Trachea midline, no masses. Cardiovascular: No clubbing, cyanosis, or edema. Respiratory: Normal respiratory effort, no increased work of breathing. GI: Abdomen is soft, nontender, nondistended, no abdominal masses GU: Foley catheter in place. Skin: No rashes, bruises or suspicious lesions. Neurologic: Grossly intact, no focal deficits, moving all 4 extremities. Psychiatric: Normal mood and affect.  Laboratory Data: Lab Results  Component Value Date   WBC 5.6 01/22/2017   HGB 14.8 01/22/2017   HCT 43.0  01/22/2017   MCV 89.8 01/22/2017   PLT 164.0 01/22/2017    Lab Results  Component Value Date   CREATININE 0.90 07/23/2017    Lab Results  Component Value Date   PSA 12.06 (H) 01/22/2017   PSA 10.86 (H) 06/28/2015   PSA 5.32 (H) 02/20/2014     Lab Results  Component Value Date   HGBA1C 6.6 (H) 07/23/2017    Urinalysis    Component Value Date/Time   COLORURINE STRAW (A) 12/04/2017 0451   APPEARANCEUR CLEAR (A) 12/04/2017 0451   APPEARANCEUR Clear 02/18/2017 1113   LABSPEC 1.005 12/04/2017 0451   PHURINE 6.0 12/04/2017 0451   GLUCOSEU NEGATIVE 12/04/2017 0451   HGBUR SMALL (A) 12/04/2017 0451   BILIRUBINUR NEGATIVE 12/04/2017 0451   BILIRUBINUR Negative 11/23/2017 1440   BILIRUBINUR Negative 02/18/2017 [place] 12/04/2017 0451   PROTEINUR NEGATIVE 12/04/2017 0451  UROBILINOGEN 0.2 11/23/2017 1440   NITRITE NEGATIVE 12/04/2017 0451   LEUKOCYTESUR NEGATIVE 12/04/2017 0451   LEUKOCYTESUR Negative 02/18/2017 1113    Lab Results  Component Value Date   BACTERIA NONE SEEN 12/04/2017    Pertinent Imaging: Scrotal ultrasound from 11/23/2017 reviewed, agree with assessment of increased vascularity of the epididymis, right greater than left with a reactive right hydrocele.  Assessment & Plan:    1. Acute urinary retention Status post multiple failed voiding trials despite Flomax Complicated by UTI/epididymitis We discussed management options for his Foley catheter today, favor removing Foley and teaching clean intermittent catheterization for the time being which the patient was agreeable to He will need a coud tip given his history of massive BPH Start with clean intermittent cath 3-4 times a day, titrate up or down as needed See nursing note for details  2. BPH with urinary obstruction Continue Flomax We will add finasteride Lengthy discussion today regarding his ongoing urinary retention, would be best served with an outlet procedure Given his  prostamegaly with a prostate well over 100 g, he will need to be best served with a robotic simple prostatectomy versus open simple prostatectomy versus holmium laser enucleation of the prostate He is most interested in a minimally invasive option in the form of a holmium laser enucleation of the prostate We discussed the procedure itself today at length including preoperative, intraoperative, postoperative course We discussed the risks of the procedure including risk of bleeding, infection, failure to resolve his retention, irritative voiding symptoms postoperatively, retrograde ejaculation, incontinence including stress and urge incontinence postop amongst other comp occasions He understands that he will need a Foley catheter for 1 to 2 days following the procedure which is generally outpatient Prior to booking this procedure, would like to assess his anatomy with office cystoscopy, patient was agreeable to this and will be scheduled for next week for this evaluation  3. Elevated PSA Personal history of elevated PSA status post multiple previous negative biopsies including most recent negative prostate biopsy less than 1 year ago He did have an appropriate doubling in his PSA after stopping finasteride which accounts for this appropriate jump in his absolute number Minimal concern for prostate cancer at this point, will likely proceed with outlet procedure and continue to follow PSA   Return next week for cystoscopy  Hollice Espy, MD  [facility] [address] [place], [place] [address] [phone]  I spent 25 min with this patient of which greater than 50% was spent in counseling and coordination of care with the patient.

## 2017-12-09 ENCOUNTER — Ambulatory Visit: Payer: 59 | Admitting: Urology

## 2017-12-13 NOTE — Progress Notes (Signed)
Continuous Intermittent Catheterization  Due to urinary rentention patient is present today for a teaching of self I & O Catheterization. Patient was given detailed verbal and printed instructions of self catheterization. Patient was cleaned and prepped in a sterile fashion.  With instruction and assistance patient inserted a 14FR coude and urine return was noted 20 ml, urine was yellow in color. Patient tolerated well, no complications were noted Patient was given a sample bag with supplies to take home.  Instructions were given per Dr. Erlene Quan  for patient to cath 3-4 times daily trying to urinate and recording PVR.  An order will be placed for catheters to be sent to the patient's home. Patient is to call with diary volumes and will let us know if he prefers the flex coude or the speedi cath before script will be sent to [facility].   Preformed by: Fonnie Jarvis, CMA

## 2017-12-14 ENCOUNTER — Ambulatory Visit (INDEPENDENT_AMBULATORY_CARE_PROVIDER_SITE_OTHER): Payer: 59 | Admitting: Urology

## 2017-12-14 ENCOUNTER — Encounter: Payer: Self-pay | Admitting: Urology

## 2017-12-14 VITALS — BP 134/85 | HR 99 | Resp 16 | Ht 68.0 in | Wt 228.8 lb

## 2017-12-14 DIAGNOSIS — N401 Enlarged prostate with lower urinary tract symptoms: Secondary | ICD-10-CM

## 2017-12-14 DIAGNOSIS — R338 Other retention of urine: Secondary | ICD-10-CM

## 2017-12-14 DIAGNOSIS — N138 Other obstructive and reflux uropathy: Secondary | ICD-10-CM | POA: Diagnosis not present

## 2017-12-14 LAB — URINALYSIS, COMPLETE
BILIRUBIN UA: NEGATIVE
GLUCOSE, UA: NEGATIVE
KETONES UA: NEGATIVE
LEUKOCYTES UA: NEGATIVE
Nitrite, UA: NEGATIVE
Protein, UA: NEGATIVE
SPEC GRAV UA: 1.015 (ref 1.005–1.030)
Urobilinogen, Ur: 0.2 mg/dL (ref 0.2–1.0)
pH, UA: 6 (ref 5.0–7.5)

## 2017-12-14 LAB — MICROSCOPIC EXAMINATION
Bacteria, UA: NONE SEEN
WBC, UA: NONE SEEN /hpf (ref 0–5)

## 2017-12-14 MED ORDER — CIPROFLOXACIN HCL 500 MG PO TABS
500.0000 mg | ORAL_TABLET | Freq: Once | ORAL | Status: AC
  Administered 2017-12-14: 500 mg via ORAL

## 2017-12-15 ENCOUNTER — Other Ambulatory Visit: Payer: Self-pay | Admitting: Radiology

## 2017-12-15 DIAGNOSIS — N401 Enlarged prostate with lower urinary tract symptoms: Principal | ICD-10-CM

## 2017-12-15 DIAGNOSIS — R338 Other retention of urine: Secondary | ICD-10-CM

## 2017-12-15 DIAGNOSIS — N138 Other obstructive and reflux uropathy: Secondary | ICD-10-CM

## 2017-12-15 NOTE — Progress Notes (Signed)
   12/14/17  CC:  Chief Complaint  Patient presents with  . Cysto    HPI: 62 year old male with massive BPH and urinary retention currently on CIC who presents today for cystoscopy for further evaluation of his prostatic anatomy.  Please see previous note from 12/08/2017 for details.  Since last visit, he has been self cathing without difficulty.  He has been trying to avoid more spontaneously and thinks that he is improving.  Blood pressure 134/85, pulse 99, resp. rate 16, height 5\' 8"  (1.727 m), weight 228 lb 12.8 oz (103.8 kg), SpO2 96 %. NED. A&Ox3.   No respiratory distress   Abd soft, NT, ND Normal phallus with bilateral descended testicles  Cystoscopy Procedure Note  Patient identification was confirmed, informed consent was obtained, and patient was prepped using Betadine solution.  Lidocaine jelly was administered per urethral meatus.    Preoperative abx where received prior to procedure.     Pre-Procedure: - Inspection reveals a normal caliber ureteral meatus.  Procedure: The flexible cystoscope was introduced without difficulty - No urethral strictures/lesions are present. - Enlarged prostate, approximately 7 cm length - Normal bladder neck - Bilateral ureteral orifices distorted from prostamegaly, unable to identify but seen on retroflexion - Bladder mucosa  reveals no ulcers, tumors, or lesions - No bladder stones - Milderate trabeculation  Retroflexion shows " kissing lateral lobe" intravesical protrusion without a significant median lobe component   Post-Procedure: - Patient tolerated the procedure well  Assessment/ Plan:  1. BPH with urinary obstruction Continue Flomax and finasteride Excellent candidate for HoLEP- reviewed risks and benefits again today - Urinalysis, Complete - ciprofloxacin (CIPRO) tablet 500 mg  2. Acute urinary retention Continue CIC as needed  Hollice Espy, MD

## 2017-12-16 ENCOUNTER — Other Ambulatory Visit: Payer: 59 | Admitting: Urology

## 2017-12-23 ENCOUNTER — Encounter
Admission: RE | Admit: 2017-12-23 | Discharge: 2017-12-23 | Disposition: A | Discharge status: Home / Self Care | Payer: 59 | Source: Ambulatory Visit | Attending: Urology | Admitting: Urology

## 2017-12-23 ENCOUNTER — Other Ambulatory Visit: Payer: Self-pay

## 2017-12-23 DIAGNOSIS — I10 Essential (primary) hypertension: Secondary | ICD-10-CM | POA: Diagnosis not present

## 2017-12-23 DIAGNOSIS — Z01810 Encounter for preprocedural cardiovascular examination: Secondary | ICD-10-CM | POA: Insufficient documentation

## 2017-12-23 DIAGNOSIS — Z01812 Encounter for preprocedural laboratory examination: Secondary | ICD-10-CM | POA: Diagnosis not present

## 2017-12-23 HISTORY — DX: Type 2 diabetes mellitus without complications: E11.9

## 2017-12-23 LAB — CBC
HEMATOCRIT: 43.3 % (ref 40.0–52.0)
Hemoglobin: 14.5 g/dL (ref 13.0–18.0)
MCH: 29.4 pg (ref 26.0–34.0)
MCHC: 33.5 g/dL (ref 32.0–36.0)
MCV: 87.6 fL (ref 80.0–100.0)
Platelets: 150 10*3/uL (ref 150–440)
RBC: 4.94 MIL/uL (ref 4.40–5.90)
RDW: 13.7 % (ref 11.5–14.5)
WBC: 4.9 10*3/uL (ref 3.8–10.6)

## 2017-12-23 LAB — BASIC METABOLIC PANEL
ANION GAP: 10 (ref 5–15)
BUN: 13 mg/dL (ref 6–20)
CHLORIDE: 103 mmol/L (ref 101–111)
CO2: 26 mmol/L (ref 22–32)
Calcium: 9.7 mg/dL (ref 8.9–10.3)
Creatinine, Ser: 0.7 mg/dL (ref 0.61–1.24)
GFR calc non Af Amer: >60 mL/min (ref >60)
Glucose, Bld: 136 mg/dL — ABNORMAL HIGH (ref 65–99)
POTASSIUM: 3.6 mmol/L (ref 3.5–5.1)
Sodium: 139 mmol/L (ref 135–145)

## 2017-12-23 LAB — URINALYSIS, ROUTINE W REFLEX MICROSCOPIC
Bacteria, UA: NONE SEEN
Bilirubin Urine: NEGATIVE
Glucose, UA: NEGATIVE mg/dL
Ketones, ur: NEGATIVE mg/dL
Leukocytes, UA: NEGATIVE
Nitrite: NEGATIVE
Protein, ur: NEGATIVE mg/dL
SPECIFIC GRAVITY, URINE: 1.004 — AB (ref 1.005–1.030)
Squamous Epithelial / LPF: NONE SEEN (ref 0–5)
WBC UA: NONE SEEN WBC/hpf (ref 0–5)
pH: 7 (ref 5.0–8.0)

## 2017-12-23 NOTE — Patient Instructions (Signed)
Your procedure is scheduled on: Tuesday, December 28, 2017  Report to [facility].      DO NOT STOP ON THE FIRST FLOOR TO REGISTER         To find out your arrival time please call [phone] between 1PM - 3PM on Monday, December 27, 2017  Remember: Instructions that are not followed completely may result in serious medical risk,  up to and including death, or upon the discretion of your surgeon and anesthesiologist your  surgery may need to be rescheduled.     _X__ 1. Do not eat food after midnight the night before your procedure.                 No gum chewing or hard candies. ABSOLUTELY NOTHING SOLID IN YOUR MOUTH AFTER 12MN                  You may drink clear liquids up to 2 hours before you are scheduled to arrive for your surgery                 - DO not drink clear liquids within 2 hours of the start of your surgery.                  Clear Liquids include:  water, apple juice without pulp, clear carbohydrate                 drink such as Clearfast of Gatorade, Black Coffee or Tea (Do not add                 anything to coffee or tea).  __X__2.  On the morning of surgery brush your teeth with toothpaste and water,                      you may rinse your mouth with mouthwash if you wish.                             Do not swallow any toothpaste of mouthwash.     _X__ 3.  No Alcohol for 24 hours before or after surgery.    __ 4.  Do Not Smoke or use e-cigarettes For 24 Hours Prior to Your Surgery.                 Do not use any chewable tobacco products for at least 6 hours prior to                 surgery.  ____  5.  Bring all medications with you on the day of surgery if instructed.   ____  6.  Notify your doctor if there is any change in your medical condition      (cold, fever, infections).     Do not wear jewelry, make-up, hairpins, clips or nail polish. Do not wear lotions, powders, or perfumes. You may wear deodorant. Do  not shave 48 hours prior to surgery. Men may shave face and neck. Do not bring valuables to the hospital.    [facility] is not responsible for any belongings or valuables.  Contacts, dentures or bridgework may not be worn into surgery. Leave your suitcase in the car. After surgery it may be brought to your room. For patients admitted to the hospital, discharge time is determined by your treatment team.   Patients discharged the day  of surgery will not be allowed to drive home.   Please read over the following fact sheets that you were given:   PREPARING FOR SURGERY   ____ Take these medicines the morning of surgery with A SIP OF WATER:    1.AMLODIPINE  2.FINASTERIDE    3. TAMSULOSIN  4. ATORVASTATIN  5.  6.  ____ Fleet Enema (as directed)   ____ Use CHG Soap as directed  __X__ Stop metformin 2 days prior to surgery. LAST DOSE ON Saturday, June 15TH  __X__ Stop ALL ASPIRIN PRODUCTS TODAY  CONTINUE TAKING THE LOSARTAN HYDROCHLOROTHIAZIDE UP UNTIL THE DAY OF SURGERY.    DO NOT TAKE ON THE MORNING OF SURGERY  _X___ Stop Anti-inflammatories TODAY              THIS INCLUDES IBUPROFEN / MOTRIN / ADVIL / ALEVE                  YOU MAY TAKE TYLENOL AT ANY TIME UP UNTIL SURGERY                      (DO NOT TAKE MORE THAN 4000MG  IN A 24 HOUR PERIOD)   ____ Stop supplements until after surgery.    ____ Bring C-Pap to the hospital.   [facility].

## 2017-12-24 LAB — URINE CULTURE: Culture: NO GROWTH

## 2017-12-28 ENCOUNTER — Observation Stay
Admission: RE | Admit: 2017-12-28 | Discharge: 2017-12-30 | Disposition: A | Discharge status: Home / Self Care | Payer: 59 | Source: Ambulatory Visit | Attending: Urology | Admitting: Urology

## 2017-12-28 ENCOUNTER — Encounter: Payer: Self-pay | Admitting: *Deleted

## 2017-12-28 ENCOUNTER — Ambulatory Visit: Payer: 59 | Admitting: Anesthesiology

## 2017-12-28 ENCOUNTER — Other Ambulatory Visit: Payer: Self-pay

## 2017-12-28 ENCOUNTER — Encounter
Admission: RE | Disposition: A | Discharge status: Home / Self Care | Payer: Self-pay | Source: Ambulatory Visit | Attending: Urology

## 2017-12-28 DIAGNOSIS — Z87891 Personal history of nicotine dependence: Secondary | ICD-10-CM | POA: Insufficient documentation

## 2017-12-28 DIAGNOSIS — N138 Other obstructive and reflux uropathy: Secondary | ICD-10-CM | POA: Diagnosis not present

## 2017-12-28 DIAGNOSIS — Z7982 Long term (current) use of aspirin: Secondary | ICD-10-CM | POA: Diagnosis not present

## 2017-12-28 DIAGNOSIS — R339 Retention of urine, unspecified: Secondary | ICD-10-CM | POA: Diagnosis not present

## 2017-12-28 DIAGNOSIS — R338 Other retention of urine: Secondary | ICD-10-CM | POA: Diagnosis present

## 2017-12-28 DIAGNOSIS — I10 Essential (primary) hypertension: Secondary | ICD-10-CM | POA: Insufficient documentation

## 2017-12-28 DIAGNOSIS — E119 Type 2 diabetes mellitus without complications: Secondary | ICD-10-CM | POA: Diagnosis not present

## 2017-12-28 DIAGNOSIS — Z7984 Long term (current) use of oral hypoglycemic drugs: Secondary | ICD-10-CM | POA: Diagnosis not present

## 2017-12-28 DIAGNOSIS — N401 Enlarged prostate with lower urinary tract symptoms: Principal | ICD-10-CM | POA: Insufficient documentation

## 2017-12-28 DIAGNOSIS — Z79899 Other long term (current) drug therapy: Secondary | ICD-10-CM | POA: Diagnosis not present

## 2017-12-28 DIAGNOSIS — N400 Enlarged prostate without lower urinary tract symptoms: Secondary | ICD-10-CM | POA: Diagnosis present

## 2017-12-28 HISTORY — PX: HOLEP-LASER ENUCLEATION OF THE PROSTATE WITH MORCELLATION: SHX6641

## 2017-12-28 LAB — HEMOGLOBIN AND HEMATOCRIT, BLOOD
HCT: 35.5 % — ABNORMAL LOW (ref 40.0–52.0)
Hemoglobin: 11.9 g/dL — ABNORMAL LOW (ref 13.0–18.0)

## 2017-12-28 LAB — GLUCOSE, CAPILLARY
GLUCOSE-CAPILLARY: 137 mg/dL — AB (ref 65–99)
GLUCOSE-CAPILLARY: 196 mg/dL — AB (ref 65–99)
Glucose-Capillary: 293 mg/dL — ABNORMAL HIGH (ref 65–99)

## 2017-12-28 SURGERY — ENUCLEATION, PROSTATE, USING LASER, WITH MORCELLATION
Anesthesia: General

## 2017-12-28 MED ORDER — MIDAZOLAM HCL 2 MG/2ML IJ SOLN
INTRAMUSCULAR | Status: DC | PRN
  Administered 2017-12-28: 1 mg via INTRAVENOUS

## 2017-12-28 MED ORDER — DEXAMETHASONE SODIUM PHOSPHATE 10 MG/ML IJ SOLN
INTRAMUSCULAR | Status: DC | PRN
  Administered 2017-12-28: 5 mg via INTRAVENOUS

## 2017-12-28 MED ORDER — SUGAMMADEX SODIUM 200 MG/2ML IV SOLN
INTRAVENOUS | Status: AC
  Filled 2017-12-28: qty 2

## 2017-12-28 MED ORDER — FAMOTIDINE 20 MG PO TABS
ORAL_TABLET | ORAL | Status: AC
  Administered 2017-12-28: 20 mg via ORAL
  Filled 2017-12-28: qty 1

## 2017-12-28 MED ORDER — FUROSEMIDE 10 MG/ML IJ SOLN
INTRAMUSCULAR | Status: DC | PRN
  Administered 2017-12-28 (×2): 10 mg via INTRAMUSCULAR

## 2017-12-28 MED ORDER — PROPOFOL 10 MG/ML IV BOLUS
INTRAVENOUS | Status: DC | PRN
  Administered 2017-12-28: 200 mg via INTRAVENOUS

## 2017-12-28 MED ORDER — PROPOFOL 10 MG/ML IV BOLUS
INTRAVENOUS | Status: AC
  Filled 2017-12-28: qty 20

## 2017-12-28 MED ORDER — LACTATED RINGERS IV SOLN
INTRAVENOUS | Status: DC | PRN
  Administered 2017-12-28: 13:00:00 via INTRAVENOUS

## 2017-12-28 MED ORDER — LIDOCAINE HCL (CARDIAC) PF 100 MG/5ML IV SOSY
PREFILLED_SYRINGE | INTRAVENOUS | Status: DC | PRN
  Administered 2017-12-28: 100 mg via INTRAVENOUS

## 2017-12-28 MED ORDER — SUCCINYLCHOLINE CHLORIDE 20 MG/ML IJ SOLN
INTRAMUSCULAR | Status: AC
  Filled 2017-12-28: qty 1

## 2017-12-28 MED ORDER — LIDOCAINE HCL (PF) 2 % IJ SOLN
INTRAMUSCULAR | Status: AC
Start: 2017-12-28 — End: ?
  Filled 2017-12-28: qty 10

## 2017-12-28 MED ORDER — BELLADONNA ALKALOIDS-OPIUM 16.2-60 MG RE SUPP
1.0000 | Freq: Every day | RECTAL | Status: DC
  Administered 2017-12-29 – 2017-12-30 (×2): 1 via RECTAL
  Filled 2017-12-28 (×2): qty 1

## 2017-12-28 MED ORDER — ROCURONIUM BROMIDE 100 MG/10ML IV SOLN
INTRAVENOUS | Status: DC | PRN
  Administered 2017-12-28: 10 mg via INTRAVENOUS
  Administered 2017-12-28: 15 mg via INTRAVENOUS
  Administered 2017-12-28: 10 mg via INTRAVENOUS
  Administered 2017-12-28 (×2): 20 mg via INTRAVENOUS
  Administered 2017-12-28 (×2): 10 mg via INTRAVENOUS

## 2017-12-28 MED ORDER — SUCCINYLCHOLINE CHLORIDE 20 MG/ML IJ SOLN
INTRAMUSCULAR | Status: DC | PRN
  Administered 2017-12-28: 100 mg via INTRAVENOUS

## 2017-12-28 MED ORDER — BELLADONNA ALKALOIDS-OPIUM 16.2-60 MG RE SUPP
RECTAL | Status: AC
  Administered 2017-12-28: 1
  Filled 2017-12-28: qty 1

## 2017-12-28 MED ORDER — SODIUM CHLORIDE 0.9 % IV SOLN
INTRAVENOUS | Status: DC
  Administered 2017-12-28 – 2017-12-29 (×3): via INTRAVENOUS

## 2017-12-28 MED ORDER — FENTANYL CITRATE (PF) 100 MCG/2ML IJ SOLN
INTRAMUSCULAR | Status: DC | PRN
  Administered 2017-12-28 (×2): 25 ug via INTRAVENOUS
  Administered 2017-12-28: 50 ug via INTRAVENOUS

## 2017-12-28 MED ORDER — SODIUM CHLORIDE 0.9 % IV SOLN
INTRAVENOUS | Status: DC
  Administered 2017-12-28: 09:00:00 via INTRAVENOUS

## 2017-12-28 MED ORDER — ONDANSETRON HCL 4 MG/2ML IJ SOLN
INTRAMUSCULAR | Status: DC | PRN
  Administered 2017-12-28: 4 mg via INTRAVENOUS

## 2017-12-28 MED ORDER — DOCUSATE SODIUM 100 MG PO CAPS
100.0000 mg | ORAL_CAPSULE | Freq: Two times a day (BID) | ORAL | Status: DC
  Administered 2017-12-28 – 2017-12-30 (×4): 100 mg via ORAL
  Filled 2017-12-28 (×4): qty 1

## 2017-12-28 MED ORDER — DIPHENHYDRAMINE HCL 50 MG/ML IJ SOLN: 12.5000 mg | Freq: Four times a day (QID) | INTRAMUSCULAR | Status: DC | PRN

## 2017-12-28 MED ORDER — HYDROCODONE-ACETAMINOPHEN 5-325 MG PO TABS: 1.0000 | ORAL_TABLET | Freq: Four times a day (QID) | ORAL | 0 refills | Status: DC | PRN

## 2017-12-28 MED ORDER — DIPHENHYDRAMINE HCL 12.5 MG/5ML PO ELIX
12.5000 mg | ORAL_SOLUTION | Freq: Four times a day (QID) | ORAL | Status: DC | PRN
  Filled 2017-12-28: qty 5

## 2017-12-28 MED ORDER — OXYBUTYNIN CHLORIDE 5 MG PO TABS
5.0000 mg | ORAL_TABLET | Freq: Once | ORAL | Status: AC
  Administered 2017-12-28: 5 mg via ORAL
  Filled 2017-12-28: qty 1

## 2017-12-28 MED ORDER — FAMOTIDINE 20 MG PO TABS
20.0000 mg | ORAL_TABLET | Freq: Once | ORAL | Status: AC
  Administered 2017-12-28: 20 mg via ORAL

## 2017-12-28 MED ORDER — FENTANYL CITRATE (PF) 100 MCG/2ML IJ SOLN
25.0000 ug | INTRAMUSCULAR | Status: AC | PRN
  Administered 2017-12-28 (×6): 25 ug via INTRAVENOUS

## 2017-12-28 MED ORDER — ONDANSETRON HCL 4 MG/2ML IJ SOLN: 4.0000 mg | Freq: Once | INTRAMUSCULAR | Status: DC | PRN

## 2017-12-28 MED ORDER — BELLADONNA ALKALOIDS-OPIUM 16.2-60 MG RE SUPP: 1.0000 | Freq: Four times a day (QID) | RECTAL | Status: DC | PRN

## 2017-12-28 MED ORDER — FENTANYL CITRATE (PF) 100 MCG/2ML IJ SOLN
INTRAMUSCULAR | Status: AC
  Administered 2017-12-28: 25 ug via INTRAVENOUS
  Filled 2017-12-28: qty 2

## 2017-12-28 MED ORDER — FENTANYL CITRATE (PF) 100 MCG/2ML IJ SOLN
INTRAMUSCULAR | Status: AC
  Filled 2017-12-28: qty 2

## 2017-12-28 MED ORDER — OXYBUTYNIN CHLORIDE 5 MG PO TABS
ORAL_TABLET | ORAL | Status: AC
  Administered 2017-12-28: 5 mg via ORAL
  Filled 2017-12-28: qty 1

## 2017-12-28 MED ORDER — OXYCODONE-ACETAMINOPHEN 5-325 MG PO TABS: 1.0000 | ORAL_TABLET | ORAL | Status: DC | PRN

## 2017-12-28 MED ORDER — DEXAMETHASONE SODIUM PHOSPHATE 10 MG/ML IJ SOLN
INTRAMUSCULAR | Status: AC
  Filled 2017-12-28: qty 1

## 2017-12-28 MED ORDER — ROCURONIUM BROMIDE 50 MG/5ML IV SOLN
INTRAVENOUS | Status: AC
  Filled 2017-12-28: qty 1

## 2017-12-28 MED ORDER — SUGAMMADEX SODIUM 200 MG/2ML IV SOLN
INTRAVENOUS | Status: DC | PRN
  Administered 2017-12-28: 100 mg via INTRAVENOUS

## 2017-12-28 MED ORDER — ONDANSETRON HCL 4 MG/2ML IJ SOLN
INTRAMUSCULAR | Status: AC
  Filled 2017-12-28: qty 2

## 2017-12-28 MED ORDER — SODIUM CHLORIDE 0.9 % IR SOLN
3000.0000 mL | Status: DC
  Administered 2017-12-28 – 2017-12-29 (×24): 3000 mL

## 2017-12-28 MED ORDER — PHENYLEPHRINE HCL 10 MG/ML IJ SOLN
INTRAMUSCULAR | Status: DC | PRN
  Administered 2017-12-28: 100 ug via INTRAVENOUS
  Administered 2017-12-28 (×2): 200 ug via INTRAVENOUS
  Administered 2017-12-28: 100 ug via INTRAVENOUS

## 2017-12-28 MED ORDER — ONDANSETRON HCL 4 MG/2ML IJ SOLN: 4.0000 mg | INTRAMUSCULAR | Status: DC | PRN

## 2017-12-28 MED ORDER — CEFAZOLIN SODIUM-DEXTROSE 1-4 GM/50ML-% IV SOLN
1.0000 g | Freq: Three times a day (TID) | INTRAVENOUS | Status: AC
  Administered 2017-12-28 – 2017-12-29 (×2): 1 g via INTRAVENOUS
  Filled 2017-12-28 (×2): qty 50

## 2017-12-28 MED ORDER — OXYBUTYNIN CHLORIDE 5 MG PO TABS
5.0000 mg | ORAL_TABLET | Freq: Three times a day (TID) | ORAL | Status: DC | PRN
  Administered 2017-12-28 – 2017-12-29 (×2): 5 mg via ORAL
  Filled 2017-12-28 (×2): qty 1

## 2017-12-28 MED ORDER — EPHEDRINE SULFATE 50 MG/ML IJ SOLN
INTRAMUSCULAR | Status: DC | PRN
  Administered 2017-12-28 (×3): 5 mg via INTRAVENOUS

## 2017-12-28 MED ORDER — DOCUSATE SODIUM 100 MG PO CAPS: 100.0000 mg | ORAL_CAPSULE | Freq: Two times a day (BID) | ORAL | 0 refills | Status: DC

## 2017-12-28 MED ORDER — SODIUM CHLORIDE 0.9 % IV SOLN
1.0000 g | INTRAVENOUS | Status: AC
  Administered 2017-12-28: 1 g via INTRAVENOUS
  Filled 2017-12-28: qty 1

## 2017-12-28 MED ORDER — ACETAMINOPHEN 325 MG PO TABS: 650.0000 mg | ORAL_TABLET | ORAL | Status: DC | PRN

## 2017-12-28 MED ORDER — MORPHINE SULFATE (PF) 2 MG/ML IV SOLN
2.0000 mg | INTRAVENOUS | Status: DC | PRN
  Administered 2017-12-28: 2 mg via INTRAVENOUS
  Filled 2017-12-28: qty 1

## 2017-12-28 MED ORDER — MIDAZOLAM HCL 2 MG/2ML IJ SOLN
INTRAMUSCULAR | Status: AC
  Filled 2017-12-28: qty 2

## 2017-12-28 MED ORDER — OXYBUTYNIN CHLORIDE 5 MG PO TABS: 5.0000 mg | ORAL_TABLET | Freq: Three times a day (TID) | ORAL | 0 refills | Status: DC | PRN

## 2017-12-28 SURGICAL SUPPLY — 34 items
ADAPTER IRRIG TUBE 2 SPIKE SOL (ADAPTER) ×4 IMPLANT
BAG URINE DRAINAGE (UROLOGICAL SUPPLIES) ×2 IMPLANT
BAG URO DRAIN 4000ML (MISCELLANEOUS) IMPLANT
CATH FOL 2WAY LX 20X30 (CATHETERS) IMPLANT
CATH FOL 2WAY LX 22X30 (CATHETERS) ×1 IMPLANT
CATH FOLEY 3WAY 30CC 22FR (CATHETERS) IMPLANT
CATH URETL 5X70 OPEN END (CATHETERS) ×2 IMPLANT
CONTAINER COLLECT MORCELLATR (MISCELLANEOUS) ×1 IMPLANT
DRAPE SHEET LG 3/4 BI-LAMINATE (DRAPES) ×2 IMPLANT
DRAPE UTILITY 15X26 TOWEL STRL (DRAPES) IMPLANT
FIBER LASER 550 (Laser) ×1 IMPLANT
FILTER OVERFLOW MORCELLATOR (FILTER) ×1 IMPLANT
GLOVE BIO SURGEON STRL SZ 6.5 (GLOVE) ×4 IMPLANT
GOWN STRL REUS W/ TWL LRG LVL3 (GOWN DISPOSABLE) ×2 IMPLANT
GOWN STRL REUS W/TWL LRG LVL3 (GOWN DISPOSABLE) ×2
HOLDER FOLEY CATH W/STRAP (MISCELLANEOUS) ×1 IMPLANT
KIT TURNOVER CYSTO (KITS) ×2 IMPLANT
LASER FIBER 550M SMARTSCOPE (Laser) ×2 IMPLANT
LOOP CUT BIPOLAR 24F LRG (ELECTROSURGICAL) ×2 IMPLANT
MORCELLATOR COLLECT CONTAINER (MISCELLANEOUS) ×2
MORCELLATOR OVERFLOW FILTER (FILTER) ×2
MORCELLATOR ROTATION 4.75 335 (MISCELLANEOUS) ×2 IMPLANT
PACK CYSTO AR (MISCELLANEOUS) ×2 IMPLANT
SENSORWIRE 0.038 NOT ANGLED (WIRE)
SET CYSTO W/LG BORE CLAMP LF (SET/KITS/TRAYS/PACK) IMPLANT
SET IRRIG Y TYPE TUR BLADDER L (SET/KITS/TRAYS/PACK) ×2 IMPLANT
SLEEVE PROTECTION STRL DISP (MISCELLANEOUS) ×4 IMPLANT
SOL .9 NS 3000ML IRR  AL (IV SOLUTION) ×20
SOL .9 NS 3000ML IRR AL (IV SOLUTION) ×20
SOL .9 NS 3000ML IRR UROMATIC (IV SOLUTION) ×4 IMPLANT
SYRINGE IRR TOOMEY STRL 70CC (SYRINGE) ×2 IMPLANT
TUBE PUMP MORCELLATOR PIRANHA (TUBING) ×2 IMPLANT
WATER STERILE IRR 1000ML POUR (IV SOLUTION) ×2 IMPLANT
WIRE SENSOR 0.038 NOT ANGLED (WIRE) IMPLANT

## 2017-12-28 NOTE — Anesthesia Preprocedure Evaluation (Signed)
Anesthesia Evaluation  Patient identified by MRN, date of birth, ID band Patient awake    Reviewed: Allergy & Precautions, NPO status , Patient's Chart, lab work & pertinent test results  Airway Mallampati: II  TM Distance: >3 FB     Dental no notable dental hx.    Pulmonary former smoker,    Pulmonary exam normal        Cardiovascular hypertension, Normal cardiovascular exam     Neuro/Psych negative neurological ROS  negative psych ROS   GI/Hepatic negative GI ROS, Neg liver ROS,   Endo/Other  diabetes  Renal/GU   negative genitourinary   Musculoskeletal negative musculoskeletal ROS (+)   Abdominal Normal abdominal exam  (+)   Peds negative pediatric ROS (+)  Hematology negative hematology ROS (+)   Anesthesia Other Findings   Reproductive/Obstetrics                             Anesthesia Physical Anesthesia Plan  ASA: III  Anesthesia Plan: General   Post-op Pain Management:    Induction: Intravenous  PONV Risk Score and Plan:   Airway Management Planned: Oral ETT  Additional Equipment:   Intra-op Plan:   Post-operative Plan: Extubation in OR  Informed Consent: I have reviewed the patients History and Physical, chart, labs and discussed the procedure including the risks, benefits and alternatives for the proposed anesthesia with the patient or authorized representative who has indicated his/her understanding and acceptance.   Dental advisory given  Plan Discussed with: CRNA and Surgeon  Anesthesia Plan Comments:         Anesthesia Quick Evaluation

## 2017-12-28 NOTE — Anesthesia Post-op Follow-up Note (Signed)
Anesthesia QCDR form completed.        

## 2017-12-28 NOTE — Interval H&P Note (Signed)
History and Physical Interval Note:  12/28/2017 8:55 AM  [name]  has presented today for surgery, with the diagnosis of BPH, urinary retention  The various methods of treatment have been discussed with the patient and family. After consideration of risks, benefits and other options for treatment, the patient has consented to  Procedure(s): [place] WITH MORCELLATION (N/A) as a surgical intervention .  The patient's history has been reviewed, patient examined, no change in status, stable for surgery.  I have reviewed the patient's chart and labs.  Questions were answered to the patient's satisfaction.    RRR CTAB  Daniel Lee

## 2017-12-28 NOTE — Op Note (Signed)
Date of procedure: 12/28/17  Preoperative diagnosis:  1. BPH with outlet obstruction  Postoperative diagnosis:  1. Same as above   Procedure: 1. Holmium laser enucleation of the prostate morcellation  Surgeon: Hollice Espy, MD  Anesthesia: General  Complications: None  Intraoperative findings: Massive BPH with bilobar coaptation  EBL: 250 cc  Specimens: prostate chips  Drains: 22 Fr 2 way Foley with 40 cc in the balloon  Indication: Daniel Lee is a 62 y.o. patient with massive prostamegaly (144 cc) refractory urinary retention who has elected to proceed with holmium laser enucleation of the prostate..  After reviewing the management options for treatment, he elected to proceed with the above surgical procedure(s). We have discussed the potential benefits and risks of the procedure, side effects of the proposed treatment, the likelihood of the patient achieving the goals of the procedure, and any potential problems that might occur during the procedure or recuperation. Informed consent has been obtained.  Description of procedure:  The patient was taken to the operating room and general anesthesia was induced.  The patient was placed in the dorsal lithotomy position, prepped and draped in the usual sterile fashion, and preoperative antibiotics were administered. A preoperative time-out was performed.   A  26 French resectoscope was advanced per urethra into the bladder using blunt angled obturator.  The bladder neck was inspected and noted to be mildly elevated with intravesical protrusion primarily of the lateral lobes.  The UOs were visualized, somewhat close to the bladder neck but could easily be seen with clear reflux from both.  The prostatic length was quite long with severe bilobar coaptation.  Notably, due to the urethral length and prostatic length, it was difficult to advance the scope much beyond the bladder neck.  At this point in time, 550 m laser fiber was used  and using the settings of 1.9 J and 51 Hz, 2 incisions were created extending from the bladder neck just proximal to the verumontanum meeting in the midline near the apex of the prostate.  This incision was deepened down to bladder neck fibers in the capsule.  The small median lobe tissue was then enucleated from a caudal to cranial direction pushing it into the bladder and ultimately cleaving the mucosa at the bladder neck freeing up the lobe into the bladder.  This created a nice channel to allow for more flow within the prostatic fossa.  Attention was then turned to the left lateral lobe.  A curvilinear incision was created near the apex of the prostate the proximal to the verumontanum down to level of the capsular fibers.  The lobe was then enucleated again in a cranial to caudal direction freeing up the lobe eventually dividing its attachments to the anterior commissure and ultimately freeing the lobe from the bladder neck pushing into the bladder.  Notably, multiple BPH nodules were noted in a few of these were resected in a pea-sized fashion once the primary lobe was resected.  Finally, the same exact procedure was used to enucleate the right lateral lobe.  The prostatic fossa had a somewhat irregular appearance with a few residual nodules but overall, was dramatically more patent with a large bladder neck which was dramatic improvement from preop.  There was some bleeding noted at the bladder neck.  At this point time, and nephroscope was brought in and a prone morcellator was used to begin to morcellate this large amount of prostate material within the bladder.  Care was taken to ensure that  the bladder was markedly distended during morcellation and to avoid any injury to the bladder.  Unfortunately, visualization became poor secondary to a broken lens in the nephroscope.  I was unable to continue morcellating the prostate due to poor visualization.  The scope was then exchanged for the resectoscope again  and a bipolar loop was used to morcellate the residual tissue in the bladder in a pea-sized fashion.  This was somewhat technically difficult as this was free-floating in the bladder and visualization was challenging due to bleeding.  The bladder was irrigated several times to evacuate smaller chips.  The scope was also replaced and removed several times using graspers to pull out much larger pieces per urethra.  Unfortunately, this ended up starting up some bleeding.  I did use the bipolar to fulgurate along the bladder neck at which time hematuria appeared to be satisfactory.  The bladder was then carefully inspected and there was no injuries appreciated within the bladder.  Bilateral ureteral orifices were visualized and noted to have clear reflux of urine.  These are free of any injuries.  They were relatively close to the bladder neck.  At this point in time, I felt hemostasis was adequate enough to accommodate a two-way Foley catheter, 16 [place] with a large balloon.  This was placed without difficulty over a catheter guide and the balloon was inflated.  Was irrigated several times with return of only few small clots and some residual prostate tissue.  Patient was administered IV Lasix to help with postoperative diuresis.  He was then clean and dry, repositioned supine position, reversed anesthesia, and taken to the PACU in stable condition.  In the PACU, the catheter clotted off and this was relatively refractory to hand irrigation.  As such, is 53 [place] Foley was exchanged for a 24 [place] Foley catheter, hematuria type and 40 cc was placed in the balloon.  His bladder was then further irrigated and he was placed on brisk CBI.  He will be admitted overnight for observation.  Notably, he did has a vasovagal episode in the PACU upon standing with traction on his catheter.  This resolved quickly.  Hollice Espy, M.D.

## 2017-12-28 NOTE — Progress Notes (Signed)
Patient's wife came out of room and reported patient felt like he was going to pass out.  Upon assessment patient was diaphoretic, hypotensive.  MD notified; patient reports he is feeling better.  No new orders at this time; continue to monitor.

## 2017-12-28 NOTE — Transfer of Care (Signed)
Immediate Anesthesia Transfer of Care Note  Patient: Daniel Lee  Procedure(s) Performed: HOLEP-LASER ENUCLEATION OF THE PROSTATE WITH MORCELLATION (N/A )  Patient Location: PACU  Anesthesia Type:General  Level of Consciousness: awake  Airway & Oxygen Therapy: Patient Spontanous Breathing  Post-op Assessment: Report given to RN  Post vital signs: stable  Last Vitals:  Vitals Value Taken Time  BP 103/69 12/28/2017  2:33 PM  Temp 36.3 C 12/28/2017  2:33 PM  Pulse 83 12/28/2017  2:39 PM  Resp 18 12/28/2017  2:39 PM  SpO2 100 % 12/28/2017  2:39 PM  Vitals shown include unvalidated device data.  Last Pain:  Vitals:   12/28/17 1433  TempSrc:   PainSc: 0-No pain         Complications: No apparent anesthesia complications

## 2017-12-28 NOTE — Anesthesia Procedure Notes (Signed)
Procedure Name: Intubation Date/Time: 12/28/2017 9:31 AM Performed by: Zetta Bills, CRNA Pre-anesthesia Checklist: Patient identified, Emergency Drugs available, Suction available and Patient being monitored Patient Re-evaluated:Patient Re-evaluated prior to induction Oxygen Delivery Method: Circle system utilized Preoxygenation: Pre-oxygenation with 100% oxygen Induction Type: IV induction Ventilation: Mask ventilation without difficulty Laryngoscope Size: Mac and 3 Grade View: Grade II Tube type: Oral Tube size: 7.0 mm Number of attempts: 1 Airway Equipment and Method: Stylet Placement Confirmation: ETT inserted through vocal cords under direct vision Secured at: 22 cm Tube secured with: Tape Dental Injury: Teeth and Oropharynx as per pre-operative assessment

## 2017-12-29 ENCOUNTER — Encounter: Payer: Self-pay | Admitting: Urology

## 2017-12-29 DIAGNOSIS — N401 Enlarged prostate with lower urinary tract symptoms: Secondary | ICD-10-CM | POA: Diagnosis not present

## 2017-12-29 LAB — CBC
HCT: 27.2 % — ABNORMAL LOW (ref 40.0–52.0)
Hemoglobin: 9.2 g/dL — ABNORMAL LOW (ref 13.0–18.0)
MCH: 30.1 pg (ref 26.0–34.0)
MCHC: 34 g/dL (ref 32.0–36.0)
MCV: 88.5 fL (ref 80.0–100.0)
Platelets: 176 10*3/uL (ref 150–440)
RBC: 3.07 MIL/uL — ABNORMAL LOW (ref 4.40–5.90)
RDW: 14 % (ref 11.5–14.5)
WBC: 10.2 10*3/uL (ref 3.8–10.6)

## 2017-12-29 LAB — BASIC METABOLIC PANEL
ANION GAP: 7 (ref 5–15)
BUN: 20 mg/dL (ref 6–20)
CHLORIDE: 110 mmol/L (ref 101–111)
CO2: 22 mmol/L (ref 22–32)
Calcium: 7.7 mg/dL — ABNORMAL LOW (ref 8.9–10.3)
Creatinine, Ser: 0.95 mg/dL (ref 0.61–1.24)
GFR calc Af Amer: >60 mL/min (ref >60)
GLUCOSE: 219 mg/dL — AB (ref 65–99)
POTASSIUM: 4.4 mmol/L (ref 3.5–5.1)
SODIUM: 139 mmol/L (ref 135–145)

## 2017-12-29 LAB — GLUCOSE, CAPILLARY
GLUCOSE-CAPILLARY: 172 mg/dL — AB (ref 65–99)
GLUCOSE-CAPILLARY: 123 mg/dL — AB (ref 65–99)
Glucose-Capillary: 192 mg/dL — ABNORMAL HIGH (ref 65–99)
Glucose-Capillary: 184 mg/dL — ABNORMAL HIGH (ref 65–99)

## 2017-12-29 MED ORDER — AMLODIPINE BESYLATE 5 MG PO TABS
5.0000 mg | ORAL_TABLET | Freq: Every day | ORAL | Status: DC
  Administered 2017-12-30: 5 mg via ORAL
  Filled 2017-12-29 (×2): qty 1

## 2017-12-29 MED ORDER — FINASTERIDE 5 MG PO TABS
5.0000 mg | ORAL_TABLET | Freq: Every day | ORAL | Status: DC
  Administered 2017-12-29 – 2017-12-30 (×2): 5 mg via ORAL
  Filled 2017-12-29 (×2): qty 1

## 2017-12-29 MED ORDER — INSULIN ASPART 100 UNIT/ML ~~LOC~~ SOLN
4.0000 [IU] | Freq: Three times a day (TID) | SUBCUTANEOUS | Status: DC
  Administered 2017-12-29 – 2017-12-30 (×4): 4 [IU] via SUBCUTANEOUS
  Filled 2017-12-29 (×4): qty 1

## 2017-12-29 MED ORDER — INSULIN ASPART 100 UNIT/ML ~~LOC~~ SOLN: 0.0000 [IU] | Freq: Every day | SUBCUTANEOUS | Status: DC

## 2017-12-29 MED ORDER — INSULIN ASPART 100 UNIT/ML ~~LOC~~ SOLN
0.0000 [IU] | Freq: Three times a day (TID) | SUBCUTANEOUS | Status: DC
  Administered 2017-12-29: 3 [IU] via SUBCUTANEOUS
  Administered 2017-12-29: 2 [IU] via SUBCUTANEOUS
  Administered 2017-12-29 – 2017-12-30 (×2): 3 [IU] via SUBCUTANEOUS
  Filled 2017-12-29 (×4): qty 1

## 2017-12-29 MED ORDER — LOSARTAN POTASSIUM-HCTZ 100-25 MG PO TABS: 1.0000 | ORAL_TABLET | Freq: Every day | ORAL | Status: DC

## 2017-12-29 MED ORDER — LOSARTAN POTASSIUM 50 MG PO TABS
100.0000 mg | ORAL_TABLET | Freq: Every day | ORAL | Status: DC
  Administered 2017-12-30: 100 mg via ORAL
  Filled 2017-12-29 (×2): qty 2

## 2017-12-29 MED ORDER — ATORVASTATIN CALCIUM 10 MG PO TABS
10.0000 mg | ORAL_TABLET | Freq: Every day | ORAL | Status: DC
  Administered 2017-12-29 – 2017-12-30 (×2): 10 mg via ORAL
  Filled 2017-12-29 (×2): qty 1

## 2017-12-29 MED ORDER — TAMSULOSIN HCL 0.4 MG PO CAPS
0.4000 mg | ORAL_CAPSULE | Freq: Every day | ORAL | Status: DC
  Administered 2017-12-29 – 2017-12-30 (×2): 0.4 mg via ORAL
  Filled 2017-12-29 (×2): qty 1

## 2017-12-29 MED ORDER — HYDROCHLOROTHIAZIDE 25 MG PO TABS
25.0000 mg | ORAL_TABLET | Freq: Every day | ORAL | Status: DC
  Administered 2017-12-30: 25 mg via ORAL
  Filled 2017-12-29 (×2): qty 1

## 2017-12-29 NOTE — Progress Notes (Signed)
12/29/17  Subjective: On CBI, brisk overnight requiring several irrigations.  Urine this morning appears to be clearing, sleep CBI slowed down.  Episode of presyncope yesterday evening, but otherwise improving.  Hemodynamics stable.  Anti-infectives: Anti-infectives (From admission, onward)   Start     Dose/Rate Route Frequency Ordered Stop   12/28/17 1900  ceFAZolin (ANCEF) IVPB 1 g/50 mL premix     1 g 100 mL/hr over 30 Minutes Intravenous Every 8 hours 12/28/17 1850 12/29/17 0350   12/28/17 0150  cefTRIAXone (ROCEPHIN) 1 g in sodium chloride 0.9 % 100 mL IVPB     1 g 200 mL/hr over 30 Minutes Intravenous 30 min pre-op 12/28/17 0150 12/28/17 0925      Current Facility-Administered Medications  Medication Dose Route Frequency Provider Last Rate Last Dose  . 0.9 %  sodium chloride infusion   Intravenous Continuous Hollice Espy, MD 100 mL/hr at 12/29/17 0656    . acetaminophen (TYLENOL) tablet 650 mg  650 mg Oral Q4H PRN Hollice Espy, MD      . amLODipine (NORVASC) tablet 5 mg  5 mg Oral Daily Hollice Espy, MD      . atorvastatin (LIPITOR) tablet 10 mg  10 mg Oral Daily Hollice Espy, MD   10 mg at 12/29/17 0841  . diphenhydrAMINE (BENADRYL) injection 12.5 mg  12.5 mg Intravenous Q6H PRN Hollice Espy, MD       Or  . diphenhydrAMINE (BENADRYL) 12.5 MG/5ML elixir 12.5 mg  12.5 mg Oral Q6H PRN Hollice Espy, MD      . docusate sodium (COLACE) capsule 100 mg  100 mg Oral BID Hollice Espy, MD   100 mg at 12/29/17 0841  . finasteride (PROSCAR) tablet 5 mg  5 mg Oral Daily Hollice Espy, MD   5 mg at 12/29/17 0841  . losartan (COZAAR) tablet 100 mg  100 mg Oral Daily Hollice Espy, MD       And  . hydrochlorothiazide (HYDRODIURIL) tablet 25 mg  25 mg Oral Daily Hollice Espy, MD      . insulin aspart (novoLOG) injection 0-15 Units  0-15 Units Subcutaneous TID WC Hollice Espy, MD   3 Units at 12/29/17 [phone]  . insulin aspart (novoLOG) injection 0-5 Units  0-5 Units  Subcutaneous QHS Hollice Espy, MD      . insulin aspart (novoLOG) injection 4 Units  4 Units Subcutaneous TID WC Hollice Espy, MD   4 Units at 12/29/17 0840  . morphine 2 MG/ML injection 2-4 mg  2-4 mg Intravenous Q2H PRN Hollice Espy, MD   2 mg at 12/28/17 2106  . ondansetron (ZOFRAN) injection 4 mg  4 mg Intravenous Q4H PRN Hollice Espy, MD      . opium-belladonna (B&O SUPPRETTES) 16.2-60 MG suppository 1 suppository  1 suppository Rectal Daily Hollice Espy, MD   1 suppository at 12/29/17 0843  . opium-belladonna (B&O SUPPRETTES) 16.2-60 MG suppository 1 suppository  1 suppository Rectal Q6H PRN Hollice Espy, MD      . oxybutynin (DITROPAN) tablet 5 mg  5 mg Oral Q8H PRN Hollice Espy, MD   5 mg at 12/28/17 2104  . oxyCODONE-acetaminophen (PERCOCET/ROXICET) 5-325 MG per tablet 1-2 tablet  1-2 tablet Oral Q4H PRN Hollice Espy, MD      . sodium chloride irrigation 0.9 % 3,000 mL  3,000 mL Irrigation Continuous Hollice Espy, MD   3,000 mL at 12/29/17 0734  . tamsulosin (FLOMAX) capsule 0.4 mg  0.4 mg Oral Daily Hollice Espy, MD   0.4  mg at 12/29/17 0843     Objective: Vital signs in last 24 hours: Temp:  [97 F (36.1 C)-99.1 F (37.3 C)] 99.1 F (37.3 C) (06/19 0839) Pulse Rate:  [50-118] 99 (06/19 0839) Resp:  [7-25] 17 (06/19 0839) BP: (78-142)/(56-90) 107/71 (06/19 0839) SpO2:  [92 %-100 %] 94 % (06/19 0839)  Intake/Output from previous day: 06/18 0701 - 06/19 0700 In: 17616 [P.O.:480; I.V.:3300; IV Piggyback:50] Out: 78000 [Urine:78000] Intake/Output this shift: Total I/O In: 3000 [Other:3000] Out: 4000 [Urine:4000]   Physical Exam  Constitutional: He is oriented to person, place, and time. He appears well-developed.  HENT:  Head: Normocephalic and atraumatic.  Cardiovascular: Normal rate.  Abdominal: Soft. He exhibits no distension.  Genitourinary: Penis normal.  Genitourinary Comments: 52 French hematuria catheter in place draining light pink  urine on moderate drip  Neurological: He is alert and oriented to person, place, and time.  Skin: Skin is warm and dry.  Psychiatric: He has a normal mood and affect.  Vitals reviewed.   Lab Results:  Recent Labs    12/28/17 1738 12/29/17 0429  WBC  --  10.2  HGB 11.9* 9.2*  HCT 35.5* 27.2*  PLT  --  176   BMET Recent Labs    12/29/17 0429  NA 139  K 4.4  CL 110  CO2 22  GLUCOSE 219*  BUN 20  CREATININE 0.95  CALCIUM 7.7*    Assessment: s/p Procedure(s): HOLEP-LASER ENUCLEATION OF THE PROSTATE WITH MORCELLATION  Plan: -Titrate CBI, ideally wean today -Dispo pending ability to stop/wean CBI    LOS: 0 days    Hollice Espy 12/29/2017

## 2017-12-30 ENCOUNTER — Ambulatory Visit: Payer: 59

## 2017-12-30 ENCOUNTER — Telehealth: Payer: Self-pay | Admitting: Urology

## 2017-12-30 DIAGNOSIS — N401 Enlarged prostate with lower urinary tract symptoms: Secondary | ICD-10-CM | POA: Diagnosis not present

## 2017-12-30 LAB — SURGICAL PATHOLOGY

## 2017-12-30 LAB — GLUCOSE, CAPILLARY: GLUCOSE-CAPILLARY: 153 mg/dL — AB (ref 65–99)

## 2017-12-30 NOTE — Discharge Summary (Signed)
Date of admission: 12/28/2017  Date of discharge: 12/30/2017  Admission diagnosis: BPH outlet obstruction  Discharge diagnosis: Same as above  Secondary diagnoses:  Patient Active Problem List   Diagnosis Date Noted  . Acute urinary retention 12/28/2017  . Urinary retention 11/23/2017  . UTI (urinary tract infection) 11/23/2017  . Testicular pain 11/23/2017  . Obesity 08/28/2013  . Hyperbilirubinemia 07/01/2012  . Routine general medical examination at a health care facility 06/22/2011  . ESSENTIAL HYPERTENSION, BENIGN 04/25/2009  . PURE HYPERCHOLESTEROLEMIA 07/19/2008  . Diabetes type 2, controlled ([place]) 04/16/2008  . Elevated transaminase level 04/16/2008  . Low HDL (under 40) 03/18/2007  . ERECTILE DYSFUNCTION 10/07/2006  . PROSTATE SPECIFIC ANTIGEN, ELEVATED 10/07/2006  . BPH (benign prostatic hyperplasia) 10/07/2006   Exam: Blood pressure 135/74, pulse (!) 106, temperature 98.4 F (36.9 C), temperature source Oral, resp. rate 17, height _0  (1.727 m), weight 231 lb 7 oz (105 kg), SpO2 100 %.' Constitutional: He is oriented to person, place, and time. He appears well-developed.  HENT:  Head: Normocephalic and atraumatic.  Cardiovascular: Normal rate.  Abdominal: Soft. He exhibits no distension.  Genitourinary: Penis normal.  Genitourinary Comments: 39 French hematuria catheter in place draining cranberry urine without clot after CBI stopped after several hours.  Active voiding trial with light pink urine.  Neurological: He is alert and oriented to person, place, and time.  Skin: Skin is warm and dry.  Psychiatric: He has a normal mood and affect.  Vitals reviewed.   History and Physical: For full details, please see admission history and physical. Briefly, Daniel Lee is a 62 y.o. year old patient with prostamegaly and urinary obstruction, history of urinary retention who is elected to undergo holmium laser enucleation of the prostate in the setting of massive  prostamegaly.  He was admitted postoperatively for CBI due to fairly significant gross hematuria.  Hospital Course: Patient tolerated the procedure well.  He was then transferred to the floor.  He remained on CBI which was able to be titrated to slow on postop day 1 and ultimately titrated off on postop day 2.  He underwent a voiding trial on the morning of postop due to which showed adequate bladder emptying.  His hospital course was uncomplicated.  On POD#2 he had met discharge criteria: was eating a regular diet, was up and ambulating independently,  pain was well controlled, was voiding without a catheter, and was ready to for discharge.   Laboratory values:  Recent Labs    12/28/17 1738 12/29/17 0429  WBC  --  10.2  HGB 11.9* 9.2*  HCT 35.5* 27.2*   Recent Labs    12/29/17 0429  NA 139  K 4.4  CL 110  CO2 22  GLUCOSE 219*  BUN 20  CREATININE 0.95  CALCIUM 7.7*   No results for input(s): LABPT, INR in the last 72 hours. No results for input(s): LABURIN in the last 72 hours. Results for orders placed or performed during the hospital encounter of 12/23/17  Urine culture     Status: None   Collection Time: 12/23/17  9:12 AM  Result Value Ref Range Status   Specimen Description   Final    URINE, CLEAN CATCH Performed at [facility], [address] [place], [place] [address]    Special Requests   Final    NONE Performed at [facility], [address] [place],  [address]    Culture   Final    NO GROWTH  Performed at [facility], [place] [address], [place], [place] [address]    Report Status 12/24/2017 FINAL  Final    Disposition: Home  Discharge instruction: No straining or heavy lifting for 1 to 2 weeks.  Please avoid constipation.  Drink plenty of fluids.  Call if you have difficulty voiding or passing large clots.  If you are unable to void, please go to the emergency room.  Discharge medications:  Allergies as of 12/30/2017   No  Known Allergies     Medication List    TAKE these medications   accu-chek multiclix lancets Use as instructed   amLODipine 5 MG tablet Commonly known as:  NORVASC Take 1 tablet (5 mg total) by mouth daily.   aspirin 81 MG EC tablet Take 81 mg by mouth daily.   atorvastatin 10 MG tablet Commonly known as:  LIPITOR Take 1 tablet (10 mg total) by mouth daily.   BAYER CONTOUR NEXT MONITOR w/Device Kit Check blood sugar daily and as directed.   ACCU-CHEK AVIVA device Use as instructed   docusate sodium 100 MG capsule Commonly known as:  COLACE Take 1 capsule (100 mg total) by mouth 2 (two) times daily.   finasteride 5 MG tablet Commonly known as:  PROSCAR Take 1 tablet (5 mg total) by mouth daily.   glucose blood test strip Commonly known as:  ACCU-CHEK AVIVA PLUS Use as instructed   glucose blood test strip Commonly known as:  ONETOUCH VERIO USE TO CHECK BLOOD SUGAR TWICE DAILY FOR DM (DX. E11.9)   HYDROcodone-acetaminophen 5-325 MG tablet Commonly known as:  NORCO/VICODIN Take 1-2 tablets by mouth every 6 (six) hours as needed for moderate pain.   losartan-hydrochlorothiazide 100-25 MG tablet Commonly known as:  HYZAAR TAKE 1 BY MOUTH DAILY What changed:    how much to take  how to take this  when to take this  additional instructions   metFORMIN 500 MG tablet Commonly known as:  GLUCOPHAGE TAKE 1 TABLET BY MOUTH TWICE A DAY WITH A MEAL   oxybutynin 5 MG tablet Commonly known as:  DITROPAN Take 1 tablet (5 mg total) by mouth every 8 (eight) hours as needed for bladder spasms.   tamsulosin 0.4 MG Caps capsule Commonly known as:  FLOMAX Take 1 capsule (0.4 mg total) by mouth daily.       Followup:  Follow-up Information    Hollice Espy, MD On 12/30/2017.   Specialty:  Urology Why:  Thursday nurse visit for voiding trial at 830 am Contact information: [place]  [phone]

## 2017-12-30 NOTE — Progress Notes (Signed)
CBI stopped,will continue to monitor.

## 2017-12-30 NOTE — Telephone Encounter (Signed)
The follow up with you has been scheduled but do we need to reschedule the voiding trial?

## 2017-12-30 NOTE — Telephone Encounter (Signed)
Nevermind. I read in the nurse's discharge note that foley was removed.

## 2017-12-30 NOTE — Telephone Encounter (Signed)
This apt is already made.  Please look in apts.    Hollice Espy, MD

## 2017-12-30 NOTE — Telephone Encounter (Signed)
Pt was scheduled for voiding trial this morning, but was still in-patient.  Floor nurse called to see if we needed to schedule a voiding trial before his 6 wk post op the end of July.  Please advise.  I told her we would give pt a call.

## 2017-12-30 NOTE — Anesthesia Postprocedure Evaluation (Signed)
Anesthesia Post Note  Patient: Daniel Lee  Procedure(s) Performed: HOLEP-LASER ENUCLEATION OF THE PROSTATE WITH MORCELLATION (N/A )  Patient location during evaluation: PACU Anesthesia Type: General Level of consciousness: awake and alert Pain management: pain level controlled Vital Signs Assessment: post-procedure vital signs reviewed and stable Respiratory status: spontaneous breathing, nonlabored ventilation, respiratory function stable and patient connected to nasal cannula oxygen Cardiovascular status: blood pressure returned to baseline and stable Postop Assessment: no apparent nausea or vomiting Anesthetic complications: no     Last Vitals:  Vitals:   12/30/17 0503 12/30/17 0842  BP: 97/79 135/74  Pulse: 100 (!) 106  Resp: 16 17  Temp: 36.6 C 36.9 C  SpO2: 93% 100%    Last Pain:  Vitals:   12/30/17 0842  TempSrc: Oral  PainSc:                  Molli Barrows

## 2017-12-30 NOTE — Progress Notes (Signed)
Per MD okay for RN to DC pt. He has been urinating without any trouble after removal of foley catheter. Pt had a BM and has been ambulating in the room by hisself.

## 2017-12-30 NOTE — Discharge Instructions (Signed)
°  No straining or heavy lifting for 1 to 2 weeks.  Please avoid constipation.  Drink plenty of fluids.  Call if you have difficulty voiding or passing large clots.  If you are unable to void, please go to the emergency room.     AMBULATORY SURGERY  DISCHARGE INSTRUCTIONS   1) The drugs that you were given will stay in your system until tomorrow so for the next 24 hours you should not:  A) Drive an automobile B) Make any legal decisions C) Drink any alcoholic beverage   2) You may resume regular meals tomorrow.  Today it is better to start with liquids and gradually work up to solid foods.  You may eat anything you prefer, but it is better to start with liquids, then soup and crackers, and gradually work up to solid foods.   3) Please notify your doctor immediately if you have any unusual bleeding, trouble breathing, redness and pain at the surgery site, drainage, fever, or pain not relieved by medication.    4) Additional Instructions:        Please contact your physician with any problems or Same Day Surgery at [phone], Monday through Friday 6 am to 4 pm, or [place] at [facility] number at [phone].AMBULATORY SUGERY          Please contact your physician with any problems or Same Day Surgery at [phone], Monday through Friday 6 am to 4 pm, or [place] at [facility] number at [phone].AMBULATORY SURGERY  DISCHARGE INSTRUCTIONS

## 2017-12-30 NOTE — Progress Notes (Signed)
Hever M Fouch  A and O x 4. VSS. Pt tolerating diet well. No complaints of pain or nausea. IV removed intact, prescriptions given. Pt voiced understanding of discharge instructions with no further questions. Pt discharged via wheelchair with axillary.    Allergies as of 12/30/2017   No Known Allergies     Medication List    TAKE these medications   accu-chek multiclix lancets Use as instructed   amLODipine 5 MG tablet Commonly known as:  NORVASC Take 1 tablet (5 mg total) by mouth daily.   aspirin 81 MG EC tablet Take 81 mg by mouth daily.   atorvastatin 10 MG tablet Commonly known as:  LIPITOR Take 1 tablet (10 mg total) by mouth daily.   BAYER CONTOUR NEXT MONITOR w/Device Kit Check blood sugar daily and as directed.   ACCU-CHEK AVIVA device Use as instructed   docusate sodium 100 MG capsule Commonly known as:  COLACE Take 1 capsule (100 mg total) by mouth 2 (two) times daily.   finasteride 5 MG tablet Commonly known as:  PROSCAR Take 1 tablet (5 mg total) by mouth daily.   glucose blood test strip Commonly known as:  ACCU-CHEK AVIVA PLUS Use as instructed   glucose blood test strip Commonly known as:  ONETOUCH VERIO USE TO CHECK BLOOD SUGAR TWICE DAILY FOR DM (DX. E11.9)   HYDROcodone-acetaminophen 5-325 MG tablet Commonly known as:  NORCO/VICODIN Take 1-2 tablets by mouth every 6 (six) hours as needed for moderate pain.   losartan-hydrochlorothiazide 100-25 MG tablet Commonly known as:  HYZAAR TAKE 1 BY MOUTH DAILY What changed:    how much to take  how to take this  when to take this  additional instructions   metFORMIN 500 MG tablet Commonly known as:  GLUCOPHAGE TAKE 1 TABLET BY MOUTH TWICE A DAY WITH A MEAL   oxybutynin 5 MG tablet Commonly known as:  DITROPAN Take 1 tablet (5 mg total) by mouth every 8 (eight) hours as needed for bladder spasms.   tamsulosin 0.4 MG Caps capsule Commonly known as:  FLOMAX Take 1 capsule (0.4 mg  total) by mouth daily.       Vitals:   12/30/17 0503 12/30/17 0842  BP: 97/79 135/74  Pulse: 100 (!) 106  Resp: 16 17  Temp: 97.8 F (36.6 C) 98.4 F (36.9 C)  SpO2: 93% 100%    Francesco Sor

## 2017-12-31 ENCOUNTER — Ambulatory Visit: Payer: Self-pay | Admitting: Family Medicine

## 2017-12-31 NOTE — Telephone Encounter (Signed)
I returned his call.   He was in the hospital and had prostate surgery on Tuesday.   His glucose has been running in the 200s while in the hospital.   He was instructed to stop the metformin on Saturday before the surgery on Tuesday.    He resumed the metformin last night (Thursday) and took his usual dose this morning (Friday).   It was 167 this morning fasting at 6:30am.   He ate breakfast and he rechecked it while on the phone with me it was 194.   He ate breakfast at 7:00 am.  Time of glucose reading 8:35am this morning.    I let him know it was not unusual for his glucose to be elevated during times of stress such as illness, surgery, hospitalization plus he was off of his metformin for the surgery.   He is non insulin dependent.    His glucose is trending down now that he is home and back on his metformin.   See triage notes.  He mentioned having some dizzy spells after starting the Flomax.   It was normal for him to get dizzy when he squats down at work and gets back up prior to starting the Flomax.  He mentioned he has not had any more dizzy spells since being home.   His BP was low in the hospital and he was dizzy then but ok since being at home.  He does get up from sitting slowly now due to getting dizzy if gets up too fast since on the Flomax.  I went over the home care advice with him regarding monitoring his glucose and when to call us back.   He verbalized understanding of the instructions and when to call us back.    I routed a note to Dr. Glori Bickers letting her be aware of our conversation.   Reason for Disposition . Blood glucose 60-240 mg/dl (3.5 -13 mmol/l)  Answer Assessment - Initial Assessment Questions 1. BLOOD GLUCOSE: "What is your blood glucose level?"      Glucose been running 200's last few days.  Glucose 167 fasting this morning.   194 now after I ate this morning at 7:00am. 2. ONSET: "When did you check the blood glucose?"     This morning about 6:30am. 3. USUAL RANGE:  "What is your glucose level usually?" (e.g., usual fasting morning value, usual evening value)     120-140 my normal. 4. KETONES: "Do you check for ketones (urine or blood test strips)?" If yes, ask: "What does the test show now?"      No 5. TYPE 1 or 2:  "Do you know what type of diabetes you have?"  (e.g., Type 1, Type 2, Gestational; doesn't know)      Type 2.  Take Metformin.  No insulin. 6. INSULIN: "Do you take insulin?" If yes, ask: "Have you missed any shots recently?"     no 7. DIABETES PILLS: "Do you take any pills for your diabetes?" If yes, ask: "Have you missed taking any pills recently?"     Metformin.   Last Saturday stopped the Metformin due to surgery on my prostate on Tuesday.    Restarted the Metformin last night.   Took one last night and this morning. 8. OTHER SYMPTOMS: "Do you have any symptoms?" (e.g., fever, frequent urination, difficulty breathing, dizziness, weakness, vomiting)     Frequent urination from surgery.   I had catheter removed yesterday.  No other symptoms.    I'm  taking Flomax fenesteride.    Having dizziness when squat down at work and stand up I'm dizzy.   That started when I started the Flomax.   I've not been dizzy at home but had 2 episodes in the hospital.   My BP was low in the hospital.   85/58 in the hospital.  9. PREGNANCY: "Is there any chance you are pregnant?" "When was your last menstrual period?"     N/A  Protocols used: DIABETES - HIGH BLOOD SUGAR-A-AH

## 2017-12-31 NOTE — Telephone Encounter (Signed)
Dr Glori Bickers out of office until 01/10/18. Pt has CPX scheduled on 01/31/18. Will send note to

## 2017-12-31 NOTE — Telephone Encounter (Signed)
I agree that no action needed now except the daily metformin and monitoring sugars Send to Dr T for her info

## 2018-01-06 ENCOUNTER — Emergency Department
Admission: EM | Admit: 2018-01-06 | Discharge: 2018-01-06 | Disposition: A | Discharge status: Home / Self Care | Payer: 59 | Attending: Emergency Medicine | Admitting: Emergency Medicine

## 2018-01-06 ENCOUNTER — Encounter: Payer: Self-pay | Admitting: Emergency Medicine

## 2018-01-06 ENCOUNTER — Other Ambulatory Visit: Payer: Self-pay

## 2018-01-06 DIAGNOSIS — N3289 Other specified disorders of bladder: Secondary | ICD-10-CM

## 2018-01-06 DIAGNOSIS — R301 Vesical tenesmus: Secondary | ICD-10-CM | POA: Diagnosis not present
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
## 2018-01-06 NOTE — Discharge Instructions (Signed)
Please return for any increase in pain, increase in amount of blood in your urine, or other concerns.

## 2018-01-07 ENCOUNTER — Telehealth: Payer: Self-pay | Admitting: Urology
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
## 2018-01-10 NOTE — Telephone Encounter (Signed)
Pt states he is doing well. States he had some frequency that cased leakage, but today seems to be better. Pt state hematuria is minimal.

## 2018-01-17 LAB — HM DIABETES EYE EXAM

## 2018-01-23 ENCOUNTER — Telehealth: Payer: Self-pay | Admitting: Family Medicine

## 2018-01-23 DIAGNOSIS — E786 Lipoprotein deficiency: Secondary | ICD-10-CM

## 2018-01-23 DIAGNOSIS — E119 Type 2 diabetes mellitus without complications: Secondary | ICD-10-CM

## 2018-01-23 DIAGNOSIS — I10 Essential (primary) hypertension: Secondary | ICD-10-CM

## 2018-01-23 DIAGNOSIS — E7800 Pure hypercholesterolemia, unspecified: Secondary | ICD-10-CM

## 2018-01-23 NOTE — Telephone Encounter (Signed)
-----   Message from Lendon Collar, RT sent at 01/18/2018  4:43 PM EDT ----- Regarding: Lab orders for Thursday 01/27/18 Please enter CPE lab orders for 01/27/18. Thanks-Lauren

## 2018-01-27 ENCOUNTER — Other Ambulatory Visit (INDEPENDENT_AMBULATORY_CARE_PROVIDER_SITE_OTHER): Payer: 59

## 2018-01-27 DIAGNOSIS — E786 Lipoprotein deficiency: Secondary | ICD-10-CM | POA: Diagnosis not present

## 2018-01-27 DIAGNOSIS — I10 Essential (primary) hypertension: Secondary | ICD-10-CM | POA: Diagnosis not present

## 2018-01-27 DIAGNOSIS — E7800 Pure hypercholesterolemia, unspecified: Secondary | ICD-10-CM | POA: Diagnosis not present

## 2018-01-27 DIAGNOSIS — E119 Type 2 diabetes mellitus without complications: Secondary | ICD-10-CM

## 2018-01-27 LAB — COMPREHENSIVE METABOLIC PANEL
ALBUMIN: 4.1 g/dL (ref 3.5–5.2)
ALT: 23 U/L (ref 0–53)
AST: 18 U/L (ref 0–37)
Alkaline Phosphatase: 78 U/L (ref 39–117)
BUN: 15 mg/dL (ref 6–23)
CALCIUM: 9.5 mg/dL (ref 8.4–10.5)
CHLORIDE: 103 meq/L (ref 96–112)
CO2: 30 mEq/L (ref 19–32)
CREATININE: 0.97 mg/dL (ref 0.40–1.50)
GFR: 82.87 mL/min (ref >60.00)
Glucose, Bld: 120 mg/dL — ABNORMAL HIGH (ref 70–99)
Potassium: 4.1 mEq/L (ref 3.5–5.1)
SODIUM: 139 meq/L (ref 135–145)
TOTAL PROTEIN: 6.9 g/dL (ref 6.0–8.3)
Total Bilirubin: 0.8 mg/dL (ref 0.2–1.2)

## 2018-01-27 LAB — CBC WITH DIFFERENTIAL/PLATELET
Basophils Absolute: 0 10*3/uL (ref 0.0–0.1)
Basophils Relative: 0.7 % (ref 0.0–3.0)
EOS ABS: 0 10*3/uL (ref 0.0–0.7)
Eosinophils Relative: 0.8 % (ref 0.0–5.0)
HEMATOCRIT: 35 % — AB (ref 39.0–52.0)
HEMOGLOBIN: 11.4 g/dL — AB (ref 13.0–17.0)
LYMPHS PCT: 28.8 % (ref 12.0–46.0)
Lymphs Abs: 1.5 10*3/uL (ref 0.7–4.0)
MCHC: 32.7 g/dL (ref 30.0–36.0)
MCV: 84.6 fl (ref 78.0–100.0)
MONO ABS: 0.6 10*3/uL (ref 0.1–1.0)
Monocytes Relative: 12.6 % — ABNORMAL HIGH (ref 3.0–12.0)
Neutro Abs: 2.9 10*3/uL (ref 1.4–7.7)
Neutrophils Relative %: 57.1 % (ref 43.0–77.0)
Platelets: 215 10*3/uL (ref 150.0–400.0)
RBC: 4.13 Mil/uL — ABNORMAL LOW (ref 4.22–5.81)
RDW: 15 % (ref 11.5–15.5)
WBC: 5.1 10*3/uL (ref 4.0–10.5)

## 2018-01-27 LAB — LIPID PANEL
CHOL/HDL RATIO: 3
CHOLESTEROL: 113 mg/dL (ref 0–200)
HDL: 38 mg/dL — AB (ref >39.00)
LDL Cholesterol: 49 mg/dL (ref 0–99)
NonHDL: 74.76
TRIGLYCERIDES: 129 mg/dL (ref 0.0–149.0)
VLDL: 25.8 mg/dL (ref 0.0–40.0)

## 2018-01-27 LAB — HEMOGLOBIN A1C: Hgb A1c MFr Bld: 5.1 % (ref 4.6–6.5)

## 2018-01-27 LAB — TSH: TSH: 3.83 u[IU]/mL (ref 0.35–4.50)

## 2018-01-31 ENCOUNTER — Encounter: Payer: Self-pay | Admitting: Family Medicine

## 2018-01-31 ENCOUNTER — Ambulatory Visit (INDEPENDENT_AMBULATORY_CARE_PROVIDER_SITE_OTHER): Payer: 59 | Admitting: Family Medicine

## 2018-01-31 VITALS — BP 125/80 | HR 83 | Ht 68.0 in | Wt 231.0 lb

## 2018-01-31 DIAGNOSIS — Z0000 Encounter for general adult medical examination without abnormal findings: Secondary | ICD-10-CM

## 2018-01-31 DIAGNOSIS — E119 Type 2 diabetes mellitus without complications: Secondary | ICD-10-CM

## 2018-01-31 DIAGNOSIS — I10 Essential (primary) hypertension: Secondary | ICD-10-CM | POA: Diagnosis not present

## 2018-01-31 DIAGNOSIS — N401 Enlarged prostate with lower urinary tract symptoms: Secondary | ICD-10-CM | POA: Diagnosis not present

## 2018-01-31 DIAGNOSIS — Z6835 Body mass index (BMI) 35.0-35.9, adult: Secondary | ICD-10-CM

## 2018-01-31 DIAGNOSIS — E7800 Pure hypercholesterolemia, unspecified: Secondary | ICD-10-CM

## 2018-01-31 DIAGNOSIS — E786 Lipoprotein deficiency: Secondary | ICD-10-CM

## 2018-01-31 MED ORDER — ATORVASTATIN CALCIUM 10 MG PO TABS: 10.0000 mg | ORAL_TABLET | Freq: Every day | ORAL | 3 refills | Status: DC

## 2018-01-31 MED ORDER — AMLODIPINE BESYLATE 5 MG PO TABS: 5.0000 mg | ORAL_TABLET | Freq: Every day | ORAL | 3 refills | Status: DC

## 2018-01-31 MED ORDER — LOSARTAN POTASSIUM-HCTZ 100-25 MG PO TABS: 1.0000 | ORAL_TABLET | Freq: Every day | ORAL | 3 refills | Status: DC

## 2018-01-31 MED ORDER — METFORMIN HCL 500 MG PO TABS: ORAL_TABLET | ORAL | 3 refills | Status: DC

## 2018-01-31 NOTE — Assessment & Plan Note (Signed)
Discussed how this problem influences overall health and the risks it imposes  Reviewed plan for weight loss with lower calorie diet (via better food choices and also portion control or program like weight watchers) and exercise building up to or more than 30 minutes 5 days per week including some aerobic activity    

## 2018-01-31 NOTE — Patient Instructions (Addendum)
Once you are released to exercise- get back to walking  Keep watching diet so we can keep a1C low   Take care of yourself   Follow up 6 months

## 2018-01-31 NOTE — Assessment & Plan Note (Signed)
Enc exercise program once he is recovered  HDL 38

## 2018-01-31 NOTE — Progress Notes (Signed)
Subjective:    Patient ID: Daniel Lee, male    DOB: 07-Aug-1955, 62 y.o.   MRN: [id number]  HPI Here for health maintenance exam and to review chronic medical problems    Wt Readings from Last 3 Encounters:  01/31/18 231 lb (104.8 kg)  01/06/18 231 lb (104.8 kg)  12/28/17 231 lb 7 oz (105 kg)  down a bit from last year  35.12 kg/m   Eye exam 1/18 Had one 2 weeks ago -we need to send for that report  No diabetic retinopathy   Flu shot 9/18   Colonoscopy 11/13-10 year recall   Tetanus shot 7/18   Zoster status - had zostavax 11/16  Declines pna vaccine until 63  Prostate health  Urology -had foley for a while  BPH- had a laser procedure and helped  occ bladder spasms and incontinence  PSA elevated a year ago  Will have another psa with visit upcoming to urology  Father had prostate cancer- mild/never caused problems    bp is stable today  This am 119/77 at home this am  No cp or palpitations or headaches or edema  No side effects to medicines  BP Readings from Last 3 Encounters:  01/31/18 140/80  01/06/18 (!) 154/71  12/30/17 135/74     DM2 Lab Results  Component Value Date   HGBA1C 5.1 01/27/2018   ARB for renal protection  Prev 6.6 - it came down  Was sick and did not eat well over the past mo  Trying to eat right   Hyperlipidemia Lab Results  Component Value Date   CHOL 113 01/27/2018   CHOL 99 09/10/2017   CHOL 150 07/23/2017   Lab Results  Component Value Date   HDL 38.00 (L) 01/27/2018   HDL 33.90 (L) 09/10/2017   HDL 36.70 (L) 07/23/2017   Lab Results  Component Value Date   LDLCALC 49 01/27/2018   LDLCALC 44 09/10/2017   LDLCALC 91 07/23/2017   Lab Results  Component Value Date   TRIG 129.0 01/27/2018   TRIG 106.0 09/10/2017   TRIG 109.0 07/23/2017   Lab Results  Component Value Date   CHOLHDL 3 01/27/2018   CHOLHDL 3 09/10/2017   CHOLHDL 4 07/23/2017   Lab Results  Component Value Date   LDLDIRECT 99.9  06/23/2011  atorvastatin and diet  Good profile- has not been able to exercise with recent illness  Lab Results  Component Value Date   WBC 5.1 01/27/2018   HGB 11.4 (L) 01/27/2018   HCT 35.0 (L) 01/27/2018   MCV 84.6 01/27/2018   PLT 215.0 01/27/2018  s/p surgery and blood loss Is improving    Lab Results  Component Value Date   CREATININE 0.97 01/27/2018   BUN 15 01/27/2018   NA 139 01/27/2018   K 4.1 01/27/2018   CL 103 01/27/2018   CO2 30 01/27/2018   Lab Results  Component Value Date   ALT 23 01/27/2018   AST 18 01/27/2018   ALKPHOS 78 01/27/2018   BILITOT 0.8 01/27/2018    Lab Results  Component Value Date   TSH 3.83 01/27/2018    Patient Active Problem List   Diagnosis Date Noted  . Obesity 08/28/2013  . Routine general medical examination at a health care facility 06/22/2011  . ESSENTIAL HYPERTENSION, BENIGN 04/25/2009  . PURE HYPERCHOLESTEROLEMIA 07/19/2008  . Diabetes type 2, controlled ([place]) 04/16/2008  . Low HDL (under 40) 03/18/2007  . ERECTILE DYSFUNCTION 10/07/2006  . PROSTATE  SPECIFIC ANTIGEN, ELEVATED 10/07/2006  . BPH (benign prostatic hyperplasia) 10/07/2006   Past Medical History:  Diagnosis Date  . BPH (benign prostatic hypertrophy)   . Diabetes mellitus without complication ([place])   . ED (erectile dysfunction)   . Elevated alanine aminotransferase (ALT) level    suspect fatty liver  . Hyperglycemia   . Hypertension    Past Surgical History:  Procedure Laterality Date  . abd ultrasound  11/2005   negative  . COLONOSCOPY    . HOLEP-LASER ENUCLEATION OF THE PROSTATE WITH MORCELLATION N/A 12/28/2017   Procedure: HOLEP-LASER ENUCLEATION OF THE PROSTATE WITH MORCELLATION;  Surgeon: Hollice Espy, MD;  Location: [facility] ORS;  Service: Urology;  Laterality: N/A;  . PROSTATE SURGERY  2003   prostate biopsy negative // urologist   Social History   Tobacco Use  . Smoking status: Former Smoker    Packs/day: 0.25    Types: Cigarettes     Last attempt to quit: 07/13/2001    Years since quitting: 16.5  . Smokeless tobacco: Former Systems developer    Types: Chew    Quit date: 07/13/2001  . Tobacco comment: smoked a cigarette every couple of days  Substance Use Topics  . Alcohol use: No    Alcohol/week: 0.0 oz  . Drug use: No   Family History  Problem Relation Age of Onset  . Cancer Father        prostate cancer   . Diabetes Father   . Stroke Father   . Prostate cancer Father   . Hypertension Mother   . Hyperlipidemia Mother   . Bladder Cancer Mother   . Heart disease Brother   . Diabetes Brother   . Kidney disease Brother   . Cancer Paternal Uncle        prostate cancer  . Kidney cancer Neg Hx    No Known Allergies Current Outpatient Medications on File Prior to Visit  Medication Sig Dispense Refill  . aspirin 81 MG EC tablet Take 81 mg by mouth daily.     Marland Kitchen docusate sodium (COLACE) 100 MG capsule Take 1 capsule (100 mg total) by mouth 2 (two) times daily. 60 capsule 0  . finasteride (PROSCAR) 5 MG tablet Take 1 tablet (5 mg total) by mouth daily. 30 tablet 2  . glucose blood (ACCU-CHEK AVIVA PLUS) test strip Use as instructed 100 each 12  . Lancets (ACCU-CHEK MULTICLIX) lancets Use as instructed 100 each 12  . tamsulosin (FLOMAX) 0.4 MG CAPS capsule Take 1 capsule (0.4 mg total) by mouth daily. 30 capsule 11  . Blood Glucose Monitoring Suppl (ACCU-CHEK AVIVA) device Use as instructed (Patient not taking: Reported on 01/31/2018) 1 each 0   No current facility-administered medications on file prior to visit.      Review of Systems  Constitutional: Negative for activity change, appetite change, fatigue, fever and unexpected weight change.  HENT: Negative for congestion, rhinorrhea, sore throat and trouble swallowing.   Eyes: Negative for pain, redness, itching and visual disturbance.  Respiratory: Negative for cough, chest tightness, shortness of breath and wheezing.   Cardiovascular: Negative for chest pain and  palpitations.  Gastrointestinal: Negative for abdominal pain, blood in stool, constipation, diarrhea and nausea.  Endocrine: Negative for cold intolerance, heat intolerance, polydipsia and polyuria.  Genitourinary: Negative for difficulty urinating, dysuria, frequency, hematuria, testicular pain and urgency.       Some urinary incontinence s/p prostate surgery  Musculoskeletal: Negative for arthralgias, joint swelling and myalgias.  Skin: Negative for pallor and  rash.  Neurological: Negative for dizziness, tremors, weakness, numbness and headaches.  Hematological: Negative for adenopathy. Does not bruise/bleed easily.  Psychiatric/Behavioral: Negative for decreased concentration and dysphoric mood. The patient is not nervous/anxious.        Objective:   Physical Exam  Constitutional: He appears well-developed and well-nourished. No distress.  obese and well appearing   HENT:  Head: Normocephalic and atraumatic.  Right Ear: External ear normal.  Left Ear: External ear normal.  Nose: Nose normal.  Mouth/Throat: Oropharynx is clear and moist.  Eyes: Pupils are equal, round, and reactive to light. Conjunctivae and EOM are normal. Right eye exhibits no discharge. Left eye exhibits no discharge. No scleral icterus.  Neck: Normal range of motion. Neck supple. No JVD present. Carotid bruit is not present. No thyromegaly present.  Cardiovascular: Normal rate, regular rhythm, normal heart sounds and intact distal pulses. Exam reveals no gallop.  Pulmonary/Chest: Effort normal and breath sounds normal. No respiratory distress. He has no wheezes. He exhibits no tenderness.  Abdominal: Soft. Bowel sounds are normal. He exhibits no distension, no abdominal bruit and no mass. There is no tenderness.  Musculoskeletal: He exhibits no edema, tenderness or deformity.  Lymphadenopathy:    He has no cervical adenopathy.  Neurological: He is alert. He has normal reflexes. He displays normal reflexes. No  cranial nerve deficit. He exhibits normal muscle tone. Coordination normal.  Skin: Skin is warm and dry. No rash noted. No erythema. No pallor.  Solar lentigines diffusely  Scattered sks on back - brown/ diff sizes    Psychiatric: He has a normal mood and affect. His mood appears not anxious. He does not exhibit a depressed mood.  Pleasant and talkative           Assessment & Plan:   Problem List Items Addressed This Visit      Cardiovascular and Mediastinum   ESSENTIAL HYPERTENSION, BENIGN    bp in fair control at this time  BP Readings from Last 1 Encounters:  01/31/18 125/80   No changes needed Most recent labs reviewed  Disc lifstyle change with low sodium diet and exercise        Relevant Medications   amLODipine (NORVASC) 5 MG tablet   atorvastatin (LIPITOR) 10 MG tablet   losartan-hydrochlorothiazide (HYZAAR) 100-25 MG tablet     Endocrine   Diabetes type 2, controlled ([place])    Improved Lab Results  Component Value Date   HGBA1C 5.1 01/27/2018   However-this reflects a month of illness and not eating well  Urged to stay on track with dm diet Called for eye exam 2 wk ago- no retinopathy Disc foot care Continue metformin F/u 6 mo      Relevant Medications   atorvastatin (LIPITOR) 10 MG tablet   losartan-hydrochlorothiazide (HYZAAR) 100-25 MG tablet   metFORMIN (GLUCOPHAGE) 500 MG tablet     Genitourinary   BPH (benign prostatic hyperplasia)    S/p laser prostate surgery Doing better  Some incontinence post op  Some mild anemia-improving For urology f/u soon and also psa        Other   Low HDL (under 40)    Enc exercise program once he is recovered  HDL 38      Obesity    Discussed how this problem influences overall health and the risks it imposes  Reviewed plan for weight loss with lower calorie diet (via better food choices and also portion control or program like weight watchers) and exercise building up  to or more than 30 minutes 5  days per week including some aerobic activity         Relevant Medications   metFORMIN (GLUCOPHAGE) 500 MG tablet   PURE HYPERCHOLESTEROLEMIA    Disc goals for lipids and reasons to control them Rev last labs with pt Rev low sat fat diet in detail Well controlled with atorvastatin and diet       Relevant Medications   amLODipine (NORVASC) 5 MG tablet   atorvastatin (LIPITOR) 10 MG tablet   losartan-hydrochlorothiazide (HYZAAR) 100-25 MG tablet   Routine general medical examination at a health care facility - Primary    Reviewed health habits including diet and exercise and skin cancer prevention Reviewed appropriate screening tests for age  Also reviewed health mt list, fam hx and immunization status , as well as social and family history   See HPI Labs rev  Will hold off on pna vaccine until 24 Urged to continue wt loss  Sent for eye exam report

## 2018-01-31 NOTE — Assessment & Plan Note (Signed)
S/p laser prostate surgery Doing better  Some incontinence post op  Some mild anemia-improving For urology f/u soon and also psa

## 2018-01-31 NOTE — Assessment & Plan Note (Signed)
Reviewed health habits including diet and exercise and skin cancer prevention Reviewed appropriate screening tests for age  Also reviewed health mt list, fam hx and immunization status , as well as social and family history   See HPI Labs rev  Will hold off on pna vaccine until 32 Urged to continue wt loss  Sent for eye exam report

## 2018-01-31 NOTE — Assessment & Plan Note (Signed)
Disc goals for lipids and reasons to control them Rev last labs with pt Rev low sat fat diet in detail  Well controlled with atorvastatin and diet  

## 2018-01-31 NOTE — Assessment & Plan Note (Signed)
bp in fair control at this time  BP Readings from Last 1 Encounters:  01/31/18 125/80   No changes needed Most recent labs reviewed  Disc lifstyle change with low sodium diet and exercise

## 2018-01-31 NOTE — Assessment & Plan Note (Signed)
Improved Lab Results  Component Value Date   HGBA1C 5.1 01/27/2018   However-this reflects a month of illness and not eating well  Urged to stay on track with dm diet Called for eye exam 2 wk ago- no retinopathy Disc foot care Continue metformin F/u 6 mo

## 2018-02-03 ENCOUNTER — Encounter: Payer: Self-pay | Admitting: Family Medicine

## 2018-02-08 ENCOUNTER — Ambulatory Visit (INDEPENDENT_AMBULATORY_CARE_PROVIDER_SITE_OTHER): Payer: 59 | Admitting: Urology

## 2018-02-08 ENCOUNTER — Encounter: Payer: Self-pay | Admitting: Urology

## 2018-02-08 VITALS — BP 147/88 | HR 83 | Ht 68.0 in | Wt 230.0 lb

## 2018-02-08 DIAGNOSIS — N401 Enlarged prostate with lower urinary tract symptoms: Secondary | ICD-10-CM

## 2018-02-08 DIAGNOSIS — N138 Other obstructive and reflux uropathy: Secondary | ICD-10-CM | POA: Diagnosis not present

## 2018-02-08 DIAGNOSIS — N393 Stress incontinence (female) (male): Secondary | ICD-10-CM

## 2018-02-08 LAB — BLADDER SCAN AMB NON-IMAGING

## 2018-02-08 NOTE — Progress Notes (Signed)
02/08/2018 12:58 PM   Daniel Lee 06-05-1956 [id number]  Referring provider: Abner Greenspan, MD [address] [place], [place] [address]  Chief Complaint  Patient presents with  . Benign Prostatic Hypertrophy    6wk post op    HPI: 63 year old male status post holmium laser enucleation of the prostate on 12/28/2017 he returns today for routine follow-up.  Preoperatively, he was noted to have massive prostamegaly, 144 cc prostate with refractory urinary retention.  His postoperative course was complicated by gross hematuria requiring observation for CBI.  Ultimately, his catheter was able to be removed.  He underwent successful voiding trial following the procedure.  He did experience acute blood loss anemia, hemoglobin down to 9.2 at the time of discharge.  Since improved to 11.3 as of 01/27/2018.  Surgical pathology consistent with 97 g of benign prostatic tissue with glandular and stromal hyperplasia.  There is chronic inflammation and squamous metaplasia appreciated.  Benign urothelium cause was also present.  He continues his Flomax and finasteride.  He is now voiding spontaneously and feels like he is emptying his bladder completely.  He is very pleased with the result.  His stream is excellent.  He did have some urinary frequency but this is decreasing.  He also has a very minimal stress incontinence with vigorous activity which is also improving.  IPSS    Row Name 02/08/18 0900         International Prostate Symptom Score   How often have you had the sensation of not emptying your bladder?  Less than 1 in 5     How often have you had to urinate less than every two hours?  Less than half the time     How often have you found you stopped and started again several times when you urinated?  Not at All     How often have you found it difficult to postpone urination?  About half the time     How often have you had a weak urinary stream?  Less than 1 in 5 times       How often have you had to strain to start urination?  Not at All     How many times did you typically get up at night to urinate?  1 Time     Total IPSS Score  8       Quality of Life due to urinary symptoms   If you were to spend the rest of your life with your urinary condition just the way it is now how would you feel about that?  Pleased        Score:  1-7 Mild 8-19 Moderate 20-35 Severe    PMH: Past Medical History:  Diagnosis Date  . BPH (benign prostatic hypertrophy)   . Diabetes mellitus without complication ([place])   . ED (erectile dysfunction)   . Elevated alanine aminotransferase (ALT) level    suspect fatty liver  . Hyperglycemia   . Hypertension     Surgical History: Past Surgical History:  Procedure Laterality Date  . abd ultrasound  11/2005   negative  . COLONOSCOPY    . HOLEP-LASER ENUCLEATION OF THE PROSTATE WITH MORCELLATION N/A 12/28/2017   Procedure: HOLEP-LASER ENUCLEATION OF THE PROSTATE WITH MORCELLATION;  Surgeon: Hollice Espy, MD;  Location: [facility] ORS;  Service: Urology;  Laterality: N/A;  . PROSTATE SURGERY  2003   prostate biopsy negative // urologist    Home Medications:  Allergies as of 02/08/2018  No Known Allergies     Medication List        Accurate as of 02/08/18 12:58 PM. Always use your most recent med list.          ACCU-CHEK AVIVA device Use as instructed   accu-chek multiclix lancets Use as instructed   amLODipine 5 MG tablet Commonly known as:  NORVASC Take 1 tablet (5 mg total) by mouth daily.   aspirin 81 MG EC tablet Take 81 mg by mouth daily.   atorvastatin 10 MG tablet Commonly known as:  LIPITOR Take 1 tablet (10 mg total) by mouth daily.   finasteride 5 MG tablet Commonly known as:  PROSCAR Take 1 tablet (5 mg total) by mouth daily.   glucose blood test strip Commonly known as:  ACCU-CHEK AVIVA PLUS Use as instructed   losartan-hydrochlorothiazide 100-25 MG tablet Commonly known as:   HYZAAR Take 1 tablet by mouth daily.   metFORMIN 500 MG tablet Commonly known as:  GLUCOPHAGE TAKE 1 TABLET BY MOUTH TWICE A DAY WITH A MEAL   tamsulosin 0.4 MG Caps capsule Commonly known as:  FLOMAX Take 1 capsule (0.4 mg total) by mouth daily.       Allergies: No Known Allergies  Family History: Family History  Problem Relation Age of Onset  . Cancer Father        prostate cancer   . Diabetes Father   . Stroke Father   . Prostate cancer Father   . Hypertension Mother   . Hyperlipidemia Mother   . Bladder Cancer Mother   . Heart disease Brother   . Diabetes Brother   . Kidney disease Brother   . Cancer Paternal Uncle        prostate cancer  . Kidney cancer Neg Hx     Social History:  reports that he quit smoking about 16 years ago. His smoking use included cigarettes. He smoked 0.25 packs per day. He quit smokeless tobacco use about 16 years ago. His smokeless tobacco use included chew. He reports that he does not drink alcohol or use drugs.  ROS: UROLOGY Frequent Urination?: No Hard to postpone urination?: No Burning/pain with urination?: No Get up at night to urinate?: No Leakage of urine?: No Urine stream starts and stops?: No Trouble starting stream?: No Do you have to strain to urinate?: No Blood in urine?: No Urinary tract infection?: No Sexually transmitted disease?: No Injury to kidneys or bladder?: No Painful intercourse?: No Weak stream?: No Erection problems?: No Penile pain?: No  Gastrointestinal Nausea?: No Vomiting?: No Indigestion/heartburn?: No Diarrhea?: No Constipation?: No  Constitutional Fever: No Night sweats?: No Weight loss?: No Fatigue?: No  Skin Skin rash/lesions?: No Itching?: No  Eyes Blurred vision?: No Double vision?: No  Ears/Nose/Throat Sore throat?: No Sinus problems?: No  Hematologic/Lymphatic Swollen glands?: No Easy bruising?: No  Cardiovascular Leg swelling?: No Chest pain?:  No  Respiratory Cough?: No Shortness of breath?: No  Endocrine Excessive thirst?: No  Musculoskeletal Back pain?: No Joint pain?: No  Neurological Headaches?: No Dizziness?: No  Psychologic Depression?: No Anxiety?: No  Physical Exam: BP (!) 147/88   Pulse 83   Ht 5\' 8"  (1.727 m)   Wt 230 lb (104.3 kg)   BMI 34.97 kg/m   Constitutional:  Alert and oriented, No acute distress.  Accompanied by wife today. HEENT: [place] AT, moist mucus membranes.  Trachea midline, no masses. Cardiovascular: No clubbing, cyanosis, or edema. Respiratory: Normal respiratory effort, no increased work of breathing.  Skin: No rashes, bruises or suspicious lesions. Neurologic: Grossly intact, no focal deficits, moving all 4 extremities. Psychiatric: Normal mood and affect.  Laboratory Data: Lab Results  Component Value Date   WBC 5.1 01/27/2018   HGB 11.4 (L) 01/27/2018   HCT 35.0 (L) 01/27/2018   MCV 84.6 01/27/2018   PLT 215.0 01/27/2018    Lab Results  Component Value Date   CREATININE 0.97 01/27/2018    Lab Results  Component Value Date   PSA 12.06 (H) 01/22/2017   PSA 10.86 (H) 06/28/2015   PSA 5.32 (H) 02/20/2014    Lab Results  Component Value Date   HGBA1C 5.1 01/27/2018    Urinalysis    Component Value Date/Time   COLORURINE COLORLESS (A) 12/23/2017 0912   APPEARANCEUR CLEAR (A) 12/23/2017 0912   APPEARANCEUR Clear 12/14/2017 1547   LABSPEC 1.004 (L) 12/23/2017 0912   PHURINE 7.0 12/23/2017 0912   GLUCOSEU NEGATIVE 12/23/2017 0912   HGBUR SMALL (A) 12/23/2017 0912   BILIRUBINUR NEGATIVE 12/23/2017 0912   BILIRUBINUR Negative 12/14/2017 1547   KETONESUR NEGATIVE 12/23/2017 0912   PROTEINUR NEGATIVE 12/23/2017 0912   UROBILINOGEN 0.2 11/23/2017 1440   NITRITE NEGATIVE 12/23/2017 0912   LEUKOCYTESUR NEGATIVE 12/23/2017 0912   LEUKOCYTESUR Negative 12/14/2017 1547    Lab Results  Component Value Date   LABMICR See below: 12/14/2017   WBCUA None seen  12/14/2017   RBCUA 0-2 12/14/2017   LABEPIT 0-10 12/14/2017   BACTERIA NONE SEEN 12/23/2017    Pertinent Imaging: Results for orders placed or performed in visit on 02/08/18  BLADDER SCAN AMB NON-IMAGING  Result Value Ref Range   Scan Result 6ml     Assessment & Plan:    1. BPH with urinary obstruction Status post successful holmium laser enucleation of the prostate Adequate bladder emptying today with minimal postvoid residual Okay to stop finasteride and Flomax, discussed mechanism of action and resuming Flomax if needed if he notes worsening of his urinary symptoms within a few days PSA in 6 months for new baseline - BLADDER SCAN AMB NON-IMAGING  2. Stress incontinence of urine Very minimal stress incontinence which is improving Discussed that this almost always self-limited Encouraged pelvic floor exercises, consider referral to physical therapy if this is not resolved within the month, he will call us and let us know  Return in about 6 months (around 08/11/2018) for PSA.  Hollice Espy, MD  [facility] [address] [place], [place] [address] [phone]

## 2018-02-08 NOTE — Patient Instructions (Signed)
Pelvic Floor Muscle Exercises  More commonly called "Kegel" exercises: they can strengthen the muscles that hold urine inside the bladder.  Here's how you do them:   - Imagine that you are trying to control passing gas   - Pull in or tighten your pelvic muscles and hold for a count of 3 (you should feel a lifting sensation in the area around your vagina or pulling in your rectum)   - Repeat 10 to 15 times, at least 3 times a day   - Each time you do these exercises, alternate your position between lying, sitting and standing  Talk to you health care professional to make sure you are doing the exercise the right way.

## 2018-03-24 ENCOUNTER — Other Ambulatory Visit: Payer: 59

## 2018-03-31 ENCOUNTER — Ambulatory Visit: Payer: 59

## 2018-07-29 ENCOUNTER — Other Ambulatory Visit (INDEPENDENT_AMBULATORY_CARE_PROVIDER_SITE_OTHER): Payer: 59

## 2018-07-29 DIAGNOSIS — I10 Essential (primary) hypertension: Secondary | ICD-10-CM

## 2018-07-29 DIAGNOSIS — E119 Type 2 diabetes mellitus without complications: Secondary | ICD-10-CM

## 2018-07-29 DIAGNOSIS — R972 Elevated prostate specific antigen [PSA]: Secondary | ICD-10-CM

## 2018-07-29 DIAGNOSIS — E7800 Pure hypercholesterolemia, unspecified: Secondary | ICD-10-CM

## 2018-07-29 LAB — COMPREHENSIVE METABOLIC PANEL
ALT: 33 U/L (ref 0–53)
AST: 26 U/L (ref 0–37)
Albumin: 4.6 g/dL (ref 3.5–5.2)
Alkaline Phosphatase: 84 U/L (ref 39–117)
BUN: 20 mg/dL (ref 6–23)
CALCIUM: 9.6 mg/dL (ref 8.4–10.5)
CHLORIDE: 103 meq/L (ref 96–112)
CO2: 28 meq/L (ref 19–32)
Creatinine, Ser: 0.96 mg/dL (ref 0.40–1.50)
GFR: 78.78 mL/min (ref >60.00)
GLUCOSE: 122 mg/dL — AB (ref 70–99)
POTASSIUM: 3.8 meq/L (ref 3.5–5.1)
Sodium: 140 mEq/L (ref 135–145)
Total Bilirubin: 1.8 mg/dL — ABNORMAL HIGH (ref 0.2–1.2)
Total Protein: 7.1 g/dL (ref 6.0–8.3)

## 2018-07-29 LAB — LIPID PANEL
Cholesterol: 99 mg/dL (ref 0–200)
HDL: 36.3 mg/dL — AB (ref >39.00)
LDL Cholesterol: 45 mg/dL (ref 0–99)
NONHDL: 62.26
TRIGLYCERIDES: 87 mg/dL (ref 0.0–149.0)
Total CHOL/HDL Ratio: 3
VLDL: 17.4 mg/dL (ref 0.0–40.0)

## 2018-07-29 LAB — HEMOGLOBIN A1C: Hgb A1c MFr Bld: 6.8 % — ABNORMAL HIGH (ref 4.6–6.5)

## 2018-07-29 NOTE — Addendum Note (Signed)
Addended by: Ellamae Sia on: 07/29/2018 08:54 AM   Modules accepted: Orders

## 2018-08-05 ENCOUNTER — Encounter: Payer: Self-pay | Admitting: Family Medicine

## 2018-08-05 ENCOUNTER — Ambulatory Visit (INDEPENDENT_AMBULATORY_CARE_PROVIDER_SITE_OTHER): Payer: 59 | Admitting: Family Medicine

## 2018-08-05 VITALS — BP 132/68 | HR 103 | Temp 97.9°F | Ht 68.0 in | Wt 240.0 lb

## 2018-08-05 DIAGNOSIS — E1169 Type 2 diabetes mellitus with other specified complication: Secondary | ICD-10-CM

## 2018-08-05 DIAGNOSIS — E785 Hyperlipidemia, unspecified: Secondary | ICD-10-CM

## 2018-08-05 DIAGNOSIS — E786 Lipoprotein deficiency: Secondary | ICD-10-CM

## 2018-08-05 DIAGNOSIS — I10 Essential (primary) hypertension: Secondary | ICD-10-CM

## 2018-08-05 DIAGNOSIS — Z6836 Body mass index (BMI) 36.0-36.9, adult: Secondary | ICD-10-CM

## 2018-08-05 DIAGNOSIS — E119 Type 2 diabetes mellitus without complications: Secondary | ICD-10-CM

## 2018-08-05 NOTE — Progress Notes (Signed)
Subjective:    Patient ID: Daniel Lee, male    DOB: 09/12/1955, 63 y.o.   MRN: [id number]  HPI Here for f/u of chronic medical problems   Has been feeling good   Wt Readings from Last 3 Encounters:  08/05/18 240 lb (108.9 kg)  02/08/18 230 lb (104.3 kg)  01/31/18 231 lb (104.8 kg)  wt is up 10 lb  Ready to work on weight loss  Will cut portions (he eats pretty healthy in general)  occ fast food/tries to avoid  No exercise right now --has elliptical /plans to start  36.49 kg/m   bp is stable today  No cp or palpitations or headaches or edema  No side effects to medicines  BP Readings from Last 3 Encounters:  08/05/18 132/68  02/08/18 (!) 147/88  01/31/18 125/80     Pulse Readings from Last 3 Encounters:  08/05/18 (!) 103  02/08/18 83  01/31/18 83  he was rushing to get here  Has to go to dentist for crowns today   Diabetes Home sugar results -no hypo glyemia ,usually 130s  DM diet - overall not bad  Exercise -plans to start elliptical  Symptoms-none  A1C last  Lab Results  Component Value Date   HGBA1C 6.8 (H) 07/29/2018    No problems with medications - metformin  Renal protection- ARB Last eye exam  7/19 no retinopathy   Low HDL Lab Results  Component Value Date   CHOL 99 07/29/2018   CHOL 113 01/27/2018   CHOL 99 09/10/2017   Lab Results  Component Value Date   HDL 36.30 (L) 07/29/2018   HDL 38.00 (L) 01/27/2018   HDL 33.90 (L) 09/10/2017   Lab Results  Component Value Date   LDLCALC 45 07/29/2018   LDLCALC 49 01/27/2018   LDLCALC 44 09/10/2017   Lab Results  Component Value Date   TRIG 87.0 07/29/2018   TRIG 129.0 01/27/2018   TRIG 106.0 09/10/2017   Lab Results  Component Value Date   CHOLHDL 3 07/29/2018   CHOLHDL 3 01/27/2018   CHOLHDL 3 09/10/2017   Lab Results  Component Value Date   LDLDIRECT 99.9 06/23/2011   statin and diet   Patient Active Problem List   Diagnosis Date Noted  . Obesity 08/28/2013  .  Routine general medical examination at a health care facility 06/22/2011  . ESSENTIAL HYPERTENSION, BENIGN 04/25/2009  . Hyperlipidemia associated with type 2 diabetes mellitus ([place]) 07/19/2008  . Diabetes type 2, controlled ([place]) 04/16/2008  . Low HDL (under 40) 03/18/2007  . ERECTILE DYSFUNCTION 10/07/2006  . PROSTATE SPECIFIC ANTIGEN, ELEVATED 10/07/2006  . BPH (benign prostatic hyperplasia) 10/07/2006   Past Medical History:  Diagnosis Date  . BPH (benign prostatic hypertrophy)   . Diabetes mellitus without complication ([place])   . ED (erectile dysfunction)   . Elevated alanine aminotransferase (ALT) level    suspect fatty liver  . Hyperglycemia   . Hypertension    Past Surgical History:  Procedure Laterality Date  . abd ultrasound  11/2005   negative  . COLONOSCOPY    . HOLEP-LASER ENUCLEATION OF THE PROSTATE WITH MORCELLATION N/A 12/28/2017   Procedure: HOLEP-LASER ENUCLEATION OF THE PROSTATE WITH MORCELLATION;  Surgeon: Hollice Espy, MD;  Location: [facility] ORS;  Service: Urology;  Laterality: N/A;  . PROSTATE SURGERY  2003   prostate biopsy negative // urologist   Social History   Tobacco Use  . Smoking status: Former Smoker    Packs/day: 0.25  Types: Cigarettes    Last attempt to quit: 07/13/2001    Years since quitting: 17.0  . Smokeless tobacco: Former Systems developer    Types: Chew    Quit date: 07/13/2001  . Tobacco comment: smoked a cigarette every couple of days  Substance Use Topics  . Alcohol use: No    Alcohol/week: 0.0 standard drinks  . Drug use: No   Family History  Problem Relation Age of Onset  . Cancer Father        prostate cancer   . Diabetes Father   . Stroke Father   . Prostate cancer Father   . Hypertension Mother   . Hyperlipidemia Mother   . Bladder Cancer Mother   . Heart disease Brother   . Diabetes Brother   . Kidney disease Brother   . Cancer Paternal Uncle        prostate cancer  . Kidney cancer Neg Hx    No Known Allergies Current  Outpatient Medications on File Prior to Visit  Medication Sig Dispense Refill  . amLODipine (NORVASC) 5 MG tablet Take 1 tablet (5 mg total) by mouth daily. 90 tablet 3  . aspirin 81 MG EC tablet Take 81 mg by mouth daily.     Marland Kitchen atorvastatin (LIPITOR) 10 MG tablet Take 1 tablet (10 mg total) by mouth daily. 90 tablet 3  . glucose blood (ACCU-CHEK AVIVA PLUS) test strip Use as instructed 100 each 12  . Lancets (ACCU-CHEK MULTICLIX) lancets Use as instructed 100 each 12  . losartan-hydrochlorothiazide (HYZAAR) 100-25 MG tablet Take 1 tablet by mouth daily. 90 tablet 3  . metFORMIN (GLUCOPHAGE) 500 MG tablet TAKE 1 TABLET BY MOUTH TWICE A DAY WITH A MEAL 180 tablet 3  . tamsulosin (FLOMAX) 0.4 MG CAPS capsule Take 1 capsule by mouth daily.     No current facility-administered medications on file prior to visit.     Review of Systems  Constitutional: Negative for activity change, appetite change, fatigue, fever and unexpected weight change.  HENT: Negative for congestion, rhinorrhea, sore throat and trouble swallowing.   Eyes: Negative for pain, redness, itching and visual disturbance.  Respiratory: Negative for cough, chest tightness, shortness of breath and wheezing.   Cardiovascular: Negative for chest pain and palpitations.  Gastrointestinal: Negative for abdominal pain, blood in stool, constipation, diarrhea and nausea.  Endocrine: Negative for cold intolerance, heat intolerance, polydipsia and polyuria.  Genitourinary: Negative for difficulty urinating, dysuria, frequency and urgency.  Musculoskeletal: Negative for arthralgias, joint swelling and myalgias.  Skin: Negative for pallor and rash.  Neurological: Negative for dizziness, tremors, weakness, numbness and headaches.  Hematological: Negative for adenopathy. Does not bruise/bleed easily.  Psychiatric/Behavioral: Negative for decreased concentration and dysphoric mood. The patient is not nervous/anxious.        Objective:    Physical Exam Constitutional:      General: He is not in acute distress.    Appearance: He is well-developed. He is obese. He is not ill-appearing.  HENT:     Head: Normocephalic and atraumatic.     Right Ear: Tympanic membrane and ear canal normal.     Left Ear: Tympanic membrane and ear canal normal.     Mouth/Throat:     Mouth: Mucous membranes are moist.     Pharynx: Oropharynx is clear.  Eyes:     Conjunctiva/sclera: Conjunctivae normal.     Pupils: Pupils are equal, round, and reactive to light.  Neck:     Musculoskeletal: Normal range of motion  and neck supple.     Thyroid: No thyromegaly.     Vascular: No carotid bruit or JVD.  Cardiovascular:     Rate and Rhythm: Normal rate and regular rhythm.     Heart sounds: Normal heart sounds. No gallop.   Pulmonary:     Effort: Pulmonary effort is normal. No respiratory distress.     Breath sounds: Normal breath sounds. No wheezing or rales.  Abdominal:     General: Bowel sounds are normal. There is no distension or abdominal bruit.     Palpations: Abdomen is soft. There is no mass.     Tenderness: There is no abdominal tenderness.  Musculoskeletal:     Left lower leg: No edema.  Lymphadenopathy:     Cervical: No cervical adenopathy.  Skin:    General: Skin is warm and dry.     Findings: No rash.  Neurological:     General: No focal deficit present.     Mental Status: He is alert.     Deep Tendon Reflexes: Reflexes are normal and symmetric.  Psychiatric:        Mood and Affect: Mood normal.           Assessment & Plan:   Problem List Items Addressed This Visit      Cardiovascular and Mediastinum   ESSENTIAL HYPERTENSION, BENIGN    bp in fair control at this time  BP Readings from Last 1 Encounters:  08/05/18 132/68   No changes needed Most recent labs reviewed  Disc lifstyle change with low sodium diet and exercise          Endocrine   Diabetes type 2, controlled ([place]) - Primary    Lab Results    Component Value Date   HGBA1C 6.8 (H) 07/29/2018   Up since now eating again  In fairly good control with metformin  Eye exam utd Good foot care  On arb for renal protection  Enc wt loss -plan made for exercise       Hyperlipidemia associated with type 2 diabetes mellitus ([facility])    Stable  Disc goals for lipids and reasons to control them Rev last labs with pt Rev low sat fat diet in detail Continue statin and diet  Exercise for HDL        Other   Low HDL (under 40)    Stable in mid 30s Enc exercise  Omega 3       Obesity    Discussed how this problem influences overall health and the risks it imposes  Reviewed plan for weight loss with lower calorie diet (via better food choices and also portion control or program like weight watchers) and exercise building up to or more than 30 minutes 5 days per week including some aerobic activity   Plans to start using the elliptical or walking daily now

## 2018-08-05 NOTE — Assessment & Plan Note (Signed)
bp in fair control at this time  BP Readings from Last 1 Encounters:  08/05/18 132/68   No changes needed Most recent labs reviewed  Disc lifstyle change with low sodium diet and exercise

## 2018-08-05 NOTE — Assessment & Plan Note (Signed)
Stable in mid 30s Enc exercise  Omega 3

## 2018-08-05 NOTE — Assessment & Plan Note (Signed)
Discussed how this problem influences overall health and the risks it imposes  Reviewed plan for weight loss with lower calorie diet (via better food choices and also portion control or program like weight watchers) and exercise building up to or more than 30 minutes 5 days per week including some aerobic activity   Plans to start using the elliptical or walking daily now

## 2018-08-05 NOTE — Patient Instructions (Addendum)
A1C is ok - keep working on diet/exercise and weight loss  Start using your elliptical   Cholesterol is stable  No change in medications

## 2018-08-05 NOTE — Assessment & Plan Note (Signed)
Lab Results  Component Value Date   HGBA1C 6.8 (H) 07/29/2018   Up since now eating again  In fairly good control with metformin  Eye exam utd Good foot care  On arb for renal protection  Enc wt loss -plan made for exercise

## 2018-08-05 NOTE — Assessment & Plan Note (Signed)
Stable  Disc goals for lipids and reasons to control them Rev last labs with pt Rev low sat fat diet in detail Continue statin and diet  Exercise for HDL

## 2018-08-12 ENCOUNTER — Other Ambulatory Visit: Payer: 59

## 2018-08-12 DIAGNOSIS — R972 Elevated prostate specific antigen [PSA]: Secondary | ICD-10-CM

## 2018-08-13 LAB — PSA: PROSTATE SPECIFIC AG, SERUM: 6.3 ng/mL — AB (ref 0.0–4.0)

## 2018-08-15 NOTE — Progress Notes (Signed)
08/17/2018  4:50 PM   Daniel Lee [birth year]  [id number]  Referring provider: Abner Greenspan, MD [address] [place], [place] [address]  Chief Complaint  Patient presents with  . Benign Prostatic Hypertrophy    6 month    HPI: Daniel Lee is a 63 y.o. male personal history of BPH and elevated PSA who returns today for six-month follow-up.  He  is status post holmium laser enucleation of the prostate on 12/28/2017.  Prior to his operation, he was noted to have massive prostamegaly, 144 cc prostate with refractory urinary retention.Surgical pathology was consistent with 97 g of benign prostatic tissue with glandular and stromal hyperplasia.  Chronic inflammation and squamous metaplasia was appreciated.  Benign urothelium cause was also present.  Overall today, he is pleased with the results.  He feels like his stream is dramatically improved.  He occasionally has some post void dribbling but otherwise has no urgency frequency dysuria or gross hematuria.  He is no longer on finasteride but continues on Flomax.  No significant urge or stress incontinence.  He also mentions today that he has a personal history of erectile dysfunction.  He reports over the past 1 to 2 years, he is noticed decrease ability to maintain and achieve erections.  No previous trials of PDE 5 inhibitors.  He has no contraindications including no history of nitrate use.  PSA Trend:  02/20/2014,  5.32  06/28/2015, 10.86  01/22/2017, 12.06  09/24/2017, 13.5  08/12/2018,  6.3   PMH: Past Medical History:  Diagnosis Date  . BPH (benign prostatic hypertrophy)   . Diabetes mellitus without complication ([place])   . ED (erectile dysfunction)   . Elevated alanine aminotransferase (ALT) level    suspect fatty liver  . Hyperglycemia   . Hypertension     Surgical History: Past Surgical History:  Procedure Laterality Date  . abd ultrasound  11/2005   negative  . COLONOSCOPY    . HOLEP-LASER  ENUCLEATION OF THE PROSTATE WITH MORCELLATION N/A 12/28/2017   Procedure: HOLEP-LASER ENUCLEATION OF THE PROSTATE WITH MORCELLATION;  Surgeon: Hollice Espy, MD;  Location: [facility] ORS;  Service: Urology;  Laterality: N/A;  . PROSTATE SURGERY  2003   prostate biopsy negative // urologist    Home Medications:  Allergies as of 08/17/2018   No Known Allergies     Medication List       Accurate as of August 17, 2018  4:50 PM. Always use your most recent med list.        accu-chek multiclix lancets Use as instructed   amLODipine 5 MG tablet Commonly known as:  NORVASC Take 1 tablet (5 mg total) by mouth daily.   aspirin 81 MG EC tablet Take 81 mg by mouth daily.   atorvastatin 10 MG tablet Commonly known as:  LIPITOR Take 1 tablet (10 mg total) by mouth daily.   glucose blood test strip Commonly known as:  ACCU-CHEK AVIVA PLUS Use as instructed   losartan-hydrochlorothiazide 100-25 MG tablet Commonly known as:  HYZAAR Take 1 tablet by mouth daily.   metFORMIN 500 MG tablet Commonly known as:  GLUCOPHAGE TAKE 1 TABLET BY MOUTH TWICE A DAY WITH A MEAL   sildenafil 20 MG tablet Commonly known as:  REVATIO Take 1 tablet (20 mg total) by mouth as needed. Take 1-5 tabs as needed prior to intercourse   tamsulosin 0.4 MG Caps capsule Commonly known as:  FLOMAX Take 1 capsule by mouth daily.  Allergies: No Known Allergies  Family History: Family History  Problem Relation Age of Onset  . Cancer Father        prostate cancer   . Diabetes Father   . Stroke Father   . Prostate cancer Father   . Hypertension Mother   . Hyperlipidemia Mother   . Bladder Cancer Mother   . Heart disease Brother   . Diabetes Brother   . Kidney disease Brother   . Cancer Paternal Uncle        prostate cancer  . Kidney cancer Neg Hx     Social History:  reports that he quit smoking about 17 years ago. His smoking use included cigarettes. He smoked 0.25 packs per day. He quit  smokeless tobacco use about 17 years ago.  His smokeless tobacco use included chew. He reports that he does not drink alcohol or use drugs.  ROS: UROLOGY Frequent Urination?: No Hard to postpone urination?: No Burning/pain with urination?: No Get up at night to urinate?: No Leakage of urine?: No Urine stream starts and stops?: No Trouble starting stream?: No Do you have to strain to urinate?: No Blood in urine?: No Urinary tract infection?: No Sexually transmitted disease?: No Injury to kidneys or bladder?: No Painful intercourse?: No Weak stream?: No Erection problems?: No Penile pain?: No  Gastrointestinal Nausea?: No Vomiting?: No Indigestion/heartburn?: No Diarrhea?: No Constipation?: No  Constitutional Fever: No Night sweats?: No Weight loss?: No Fatigue?: No  Skin Skin rash/lesions?: No Itching?: No  Eyes Blurred vision?: No Double vision?: No  Ears/Nose/Throat Sore throat?: No Sinus problems?: No  Hematologic/Lymphatic Swollen glands?: No Easy bruising?: No  Cardiovascular Leg swelling?: No Chest pain?: No  Respiratory Cough?: No Shortness of breath?: No  Endocrine Excessive thirst?: No  Musculoskeletal Back pain?: No Joint pain?: No  Neurological Headaches?: No Dizziness?: No  Psychologic Depression?: No Anxiety?: No  Physical Exam: BP (!) 149/86   Pulse (!) 109   Ht 5\' 8"  (1.727 m)   Wt 238 lb (108 kg)   BMI 36.19 kg/m   Constitutional:  Well nourished. Alert and oriented, No acute distress. HEENT: [place] AT, moist mucus membranes.  Trachea midline. Cardiovascular: No clubbing, cyanosis, or edema. Respiratory: Normal respiratory effort, no increased work of breathing. GI: Abdomen is soft, non tender, non distended, no abdominal masses.  GU: No CVA tenderness.  Rectal: Patient with  normal sphincter tone.  Enlarged 50 cc prostate, nontender, no nodules. Neurologic: Grossly intact, no focal deficits, moving all 4  extremities. Psychiatric: Normal mood and affect.  Laboratory Data: Lab Results  Component Value Date   WBC 5.1 01/27/2018   HGB 11.4 (L) 01/27/2018   HCT 35.0 (L) 01/27/2018   MCV 84.6 01/27/2018   PLT 215.0 01/27/2018   Lab Results  Component Value Date   CREATININE 0.96 07/29/2018   Results for Daniel Lee, Daniel Lee (MRN [id number]) as of 08/17/2018 08:21  Ref. Range 02/20/2014 08:12 06/28/2015 08:16 01/22/2017 07:38 09/24/2017 08:20 08/12/2018 08:45  PSA Latest Ref Range: 0.10 - 4.00 ng/mL 5.32 (H) 10.86 (H) 12.06 (H)    Prostate Specific Ag, Serum Latest Ref Range: 0.0 - 4.0 ng/mL    13.5 (H) 6.3 (H)   Lab Results  Component Value Date   HGBA1C 6.8 (H) 07/29/2018    Assessment & Plan:    1. BPH with Urinary Obstruction - Status post successful holmium laser enucleation of the prostate, overall very pleased with symptoms -We discussed taking a break from Flomax to see  if he notices any change in his urinary symptoms, may resume as deemed appropriate if this medication is helpful  2.  Elevated PSA Appropriate reduction in PSA following enucleation of the prostate and cessation of finasteride Given his long history of elevated PSA, will continue to follow him on an annual basis at this point in time with PSA and DRE in 12 months to ensure stability   3. Combined arterial insufficiency and corporo-venous occlusive erectile dysfunction We discussed the pathophysiology of erectile dysfunction today along with possible congestive any factors. Discussed possible treatment options including PDE 5 inhibitors, vacuum erectile device, intracavernosal injection, MUSE, and placement of the inflatable or malleable penal prosthesis for refractory cases.  In terms of PDE 5 inhibitors, we discussed contraindications for this medication as well as common side effects. Patient was counseled on optimal use. All of his questions were answered in detail.  Prescription for sildenafil sent to pharmacy,  advised to start with 1 tablet and work his way up towards 5 as needed.  Return in about 1 year (around 08/18/2019) for IPSS/ PVR/ DRE/ PSA.   [facility] [address], [place], [place] [address] [phone]  I, Adele Schilder, am acting as a scribe for Hollice Espy, MD.    I have reviewed the above documentation for accuracy and completeness, and I agree with the above.   Hollice Espy, MD

## 2018-08-17 ENCOUNTER — Encounter: Payer: Self-pay | Admitting: Urology

## 2018-08-17 ENCOUNTER — Ambulatory Visit (INDEPENDENT_AMBULATORY_CARE_PROVIDER_SITE_OTHER): Payer: 59 | Admitting: Urology

## 2018-08-17 VITALS — BP 149/86 | HR 109 | Ht 68.0 in | Wt 238.0 lb

## 2018-08-17 DIAGNOSIS — N5203 Combined arterial insufficiency and corporo-venous occlusive erectile dysfunction: Secondary | ICD-10-CM

## 2018-08-17 DIAGNOSIS — N138 Other obstructive and reflux uropathy: Secondary | ICD-10-CM

## 2018-08-17 DIAGNOSIS — R972 Elevated prostate specific antigen [PSA]: Secondary | ICD-10-CM

## 2018-08-17 DIAGNOSIS — N401 Enlarged prostate with lower urinary tract symptoms: Secondary | ICD-10-CM

## 2018-08-17 MED ORDER — SILDENAFIL CITRATE 20 MG PO TABS: 20.0000 mg | ORAL_TABLET | ORAL | 11 refills | Status: DC | PRN

## 2018-10-05 ENCOUNTER — Telehealth: Payer: Self-pay | Admitting: Urology

## 2018-10-05 NOTE — Telephone Encounter (Signed)
Pt called and states that he is passing blood when he urinates, he denies fever, nausea or vomiting. He also states that he was doing some heavy lifting yesterday. Please advise.

## 2018-10-05 NOTE — Telephone Encounter (Signed)
Please ensure that he is not having any burning, difficulty emptying his bladder at this time.  Please encourage hydration and rest.  If he is having burning, please have him come in for UA/urine culture.  Let set him up for an electronic visit for Friday to discuss this further.  Hollice Espy, MD

## 2018-10-05 NOTE — Telephone Encounter (Addendum)
Verified patient was not having any burning symptoms or difficulty urinating. Instructed to hydrate and rest. Verbalized understanding.  Appointment made for Virtual office visit for 10/07/2018.

## 2018-10-07 ENCOUNTER — Ambulatory Visit (INDEPENDENT_AMBULATORY_CARE_PROVIDER_SITE_OTHER): Payer: 59 | Admitting: Urology

## 2018-10-07 ENCOUNTER — Other Ambulatory Visit: Payer: Self-pay

## 2018-10-07 ENCOUNTER — Other Ambulatory Visit: Payer: Self-pay | Admitting: Family Medicine

## 2018-10-07 DIAGNOSIS — R310 Gross hematuria: Secondary | ICD-10-CM | POA: Diagnosis not present

## 2018-10-07 DIAGNOSIS — N401 Enlarged prostate with lower urinary tract symptoms: Secondary | ICD-10-CM | POA: Diagnosis not present

## 2018-10-07 DIAGNOSIS — N138 Other obstructive and reflux uropathy: Secondary | ICD-10-CM

## 2018-10-07 NOTE — Progress Notes (Signed)
Virtual Visit via Telephone Note  I connected with Daniel Lee on 10/07/18 at  9:30 AM EDT by telephone and verified that I am speaking with the correct person using two identifiers.   I discussed the limitations, risks, security and privacy concerns of performing an evaluation and management service by telephone and the availability of in person appointments. I also discussed with the patient that there may be a patient responsible charge related to this service. The patient expressed understanding and agreed to proceed.   History of Present Illness: 63 year old male with a personal history of BPH/prostamegaly status post holmium laser enucleation of the prostate 6/19 with 97 g resection who presents today via telephone visit to discuss his urinary symptoms.  Patient called the office earlier this week with episode of hematuria.  He reports that on Tuesday evening, he picked up some heavy meters and subsequently developed gross hematuria.  In the beginning, there were a few clots.  On the first day, his urine was more low colored.  Yesterday it cleared up to pink lemonade but is now back to a cranberry color today.  He denies any associated urinary symptoms including no frequency, urgency, or dysuria.  No fevers or chills.  He does have a remote smoking history.  No history of kidney stones.  Last cystoscopy at the time of holmium laser enucleation of the prostate.  He does take 81 mg of aspirin for prophylaxis, no history of heart attack or stroke.  Assessment and Plan:  1. Gross hematuria We discussed the differential diagnosis for gross hematuria including nephrolithiasis, renal or upper tract tumors, bladder stones, UTIs, or bladder tumors as well as undetermined etiologies.  Based on his history of BPH status post holmium laser enucleation with prostamegaly, I suspect that his gross hematuria is related to abnormal or friable vessels on his prostate especially since onset of  hematuria was related to physical activity/straining.  That being said, he is a former smoker and has not had any recent upper tract imaging.  Per AUA guidelines, I did recommend complete hematuria evaluation including CTU, possible urine cytology, and office cystoscopy.  In light of current global pandemic, I think it is reasonable to delay this work-up for several months given these relatively low risk.  He was advised to hold his baby aspirin for a few weeks and avoid straining.  He is to drink plenty of water.  If he develops dysuria, he should come in for UA/urine culture rule out infection although this is not suspected.  He is advised to call next week if his hematuria fails to resolve to move up his evaluation.  Additionally, if he develops clots or difficulty voiding, he will call immediately or go to the emergency room.  All questions answered.  - CT HEMATURIA WORKUP; Future  2. Benign prostatic hyperplasia with urinary obstruction As above   Follow Up Instructions: F/u 3 months with CT urogram prior for cystoscopy   I discussed the assessment and treatment plan with the patient. The patient was provided an opportunity to ask questions and all were answered. The patient agreed with the plan and demonstrated an understanding of the instructions.   The patient was advised to call back or seek an in-person evaluation if the symptoms worsen or if the condition fails to improve as anticipated.  I provided 13 minutes of non-face-to-face time during this encounter.   Hollice Espy, MD

## 2018-10-12 ENCOUNTER — Telehealth: Payer: Self-pay | Admitting: Urology

## 2018-10-12 NOTE — Telephone Encounter (Signed)
Patient called the office today.  He stated that Dr. Erlene Quan asked him to call the office to report his symptoms.  He reports that he had a little blood in his urine on Sunday 3/29 through Monday morning 3/30, but has cleared up.  I thanked him for calling and advised him that I would put the information in his chart.

## 2019-01-06 ENCOUNTER — Other Ambulatory Visit: Payer: Self-pay

## 2019-01-06 ENCOUNTER — Ambulatory Visit
Admission: RE | Admit: 2019-01-06 | Discharge: 2019-01-06 | Disposition: A | Discharge status: Home / Self Care | Payer: 59 | Source: Ambulatory Visit | Attending: Urology | Admitting: Urology

## 2019-01-06 DIAGNOSIS — R310 Gross hematuria: Secondary | ICD-10-CM

## 2019-01-06 LAB — POCT I-STAT CREATININE: Creatinine, Ser: 1 mg/dL (ref 0.61–1.24)

## 2019-01-06 MED ORDER — IOHEXOL 300 MG/ML  SOLN
125.0000 mL | Freq: Once | INTRAMUSCULAR | Status: AC | PRN
  Administered 2019-01-06: 125 mL via INTRAVENOUS

## 2019-01-10 ENCOUNTER — Ambulatory Visit (INDEPENDENT_AMBULATORY_CARE_PROVIDER_SITE_OTHER): Payer: 59 | Admitting: Urology

## 2019-01-10 ENCOUNTER — Encounter: Payer: Self-pay | Admitting: Urology

## 2019-01-10 ENCOUNTER — Other Ambulatory Visit: Payer: Self-pay

## 2019-01-10 VITALS — BP 178/83 | HR 79 | Ht 68.0 in | Wt 238.0 lb

## 2019-01-10 DIAGNOSIS — R310 Gross hematuria: Secondary | ICD-10-CM

## 2019-01-10 DIAGNOSIS — N138 Other obstructive and reflux uropathy: Secondary | ICD-10-CM

## 2019-01-10 DIAGNOSIS — N401 Enlarged prostate with lower urinary tract symptoms: Secondary | ICD-10-CM

## 2019-01-10 LAB — MICROSCOPIC EXAMINATION
Bacteria, UA: NONE SEEN
RBC, Urine: NONE SEEN /hpf (ref 0–2)

## 2019-01-10 LAB — URINALYSIS, COMPLETE
Bilirubin, UA: NEGATIVE
Glucose, UA: NEGATIVE
Ketones, UA: NEGATIVE
Leukocytes,UA: NEGATIVE
Nitrite, UA: NEGATIVE
Protein,UA: NEGATIVE
RBC, UA: NEGATIVE
Specific Gravity, UA: 1.01 (ref 1.005–1.030)
Urobilinogen, Ur: 0.2 mg/dL (ref 0.2–1.0)
pH, UA: 6 (ref 5.0–7.5)

## 2019-01-10 MED ORDER — FINASTERIDE 5 MG PO TABS: 5.0000 mg | ORAL_TABLET | Freq: Every day | ORAL | 11 refills | Status: DC

## 2019-01-10 NOTE — Progress Notes (Signed)
   01/10/19  CC:  Chief Complaint  Patient presents with  . Cysto    HPI: 63 year old male with an episode of gross hematuria after heavy lifting who presents today for cystoscopy for further evaluation.  Notably, he has a significant history of massive prostamegaly status post holmium laser enucleation of the prostate.  At the time, nearly 100 g were resected.   In the interim, he is undergone CT urogram performed on 01/06/2019 which was unremarkable other than for mild massive prostamegaly.  He also has an incidental renal cyst, simple.  Notably today, he reports very little issues voiding.  He is to have a significant difficulty starting a stream which he no longer has.  He has a fairly good stream at this point.  He does note reversing in the morning, he out has a void twice but otherwise is relatively asymptomatic.  Not taking Flomax.  Height 5\' 8"  (1.727 m). NED. A&Ox3.   No respiratory distress   Abd soft, NT, ND Normal phallus with bilateral descended testicles  Cystoscopy Procedure Note  Patient identification was confirmed, informed consent was obtained, and patient was prepped using Betadine solution.  Lidocaine jelly was administered per urethral meatus.     Pre-Procedure: - Inspection reveals a normal caliber ureteral meatus.  Procedure: The flexible cystoscope was introduced without difficulty - No urethral strictures/lesions are present. - Enlarged prostate with irregular fossa, bullous edema intermittently - Normal bladder neck friable prostatic mucosa, bleeding with manipulation appreciated, regrowth of portion of median lobe appreciated - Bilateral ureteral orifices identified - Bladder mucosa  reveals no ulcers, tumors, or lesions - No bladder stones -Mild trabeculation  Retroflexion shows regrowth of portion of median lobe   Post-Procedure: - Patient tolerated the procedure well  Assessment/ Plan:  1. Gross hematuria Likely secondary to friable  prostatic mucosa, appreciated on endoscopy Fairly significant prostatic regrowth appreciated although patient remains minimally symptomatic Given episodes of hematuria with physical activity, I have advised the patient to consider finasteride therapy for friable prostatic mucosa and prostatic regrowth He is agreeable this plan and will start this medication We will plan to recheck PSA and follow-up with him in January February 2021 as scheduled - Urinalysis, Complete  2. BPH with urinary obstruction As above  F/u as scheduled   Hollice Espy, MD

## 2019-02-01 LAB — HM DIABETES EYE EXAM

## 2019-02-08 ENCOUNTER — Telehealth: Payer: Self-pay | Admitting: Family Medicine

## 2019-02-08 DIAGNOSIS — I10 Essential (primary) hypertension: Secondary | ICD-10-CM

## 2019-02-08 DIAGNOSIS — E1169 Type 2 diabetes mellitus with other specified complication: Secondary | ICD-10-CM

## 2019-02-08 DIAGNOSIS — E119 Type 2 diabetes mellitus without complications: Secondary | ICD-10-CM

## 2019-02-08 DIAGNOSIS — E785 Hyperlipidemia, unspecified: Secondary | ICD-10-CM

## 2019-02-08 DIAGNOSIS — Z0000 Encounter for general adult medical examination without abnormal findings: Secondary | ICD-10-CM

## 2019-02-08 DIAGNOSIS — N401 Enlarged prostate with lower urinary tract symptoms: Secondary | ICD-10-CM

## 2019-02-08 NOTE — Telephone Encounter (Signed)
-----   Message from Ellamae Sia sent at 01/31/2019  1:54 PM EDT ----- Regarding: Lab orders for Friday, 7.31.20 Patient is scheduled for CPX labs, please order future labs, Thanks , Karna Christmas

## 2019-02-09 ENCOUNTER — Encounter: Payer: Self-pay | Admitting: Family Medicine

## 2019-02-10 ENCOUNTER — Other Ambulatory Visit (INDEPENDENT_AMBULATORY_CARE_PROVIDER_SITE_OTHER): Payer: 59

## 2019-02-10 ENCOUNTER — Other Ambulatory Visit: Payer: Self-pay

## 2019-02-10 DIAGNOSIS — E119 Type 2 diabetes mellitus without complications: Secondary | ICD-10-CM | POA: Diagnosis not present

## 2019-02-10 DIAGNOSIS — E1169 Type 2 diabetes mellitus with other specified complication: Secondary | ICD-10-CM

## 2019-02-10 DIAGNOSIS — I10 Essential (primary) hypertension: Secondary | ICD-10-CM | POA: Diagnosis not present

## 2019-02-10 DIAGNOSIS — E785 Hyperlipidemia, unspecified: Secondary | ICD-10-CM

## 2019-02-10 LAB — HEMOGLOBIN A1C: Hgb A1c MFr Bld: 6.8 % — ABNORMAL HIGH (ref 4.6–6.5)

## 2019-02-10 LAB — CBC WITH DIFFERENTIAL/PLATELET
Basophils Absolute: 0 10*3/uL (ref 0.0–0.1)
Basophils Relative: 0.7 % (ref 0.0–3.0)
Eosinophils Absolute: 0 10*3/uL (ref 0.0–0.7)
Eosinophils Relative: 0.7 % (ref 0.0–5.0)
HCT: 43.1 % (ref 39.0–52.0)
Hemoglobin: 14.2 g/dL (ref 13.0–17.0)
Lymphocytes Relative: 29.2 % (ref 12.0–46.0)
Lymphs Abs: 1.6 10*3/uL (ref 0.7–4.0)
MCHC: 33 g/dL (ref 30.0–36.0)
MCV: 87.7 fl (ref 78.0–100.0)
Monocytes Absolute: 0.6 10*3/uL (ref 0.1–1.0)
Monocytes Relative: 11.3 % (ref 3.0–12.0)
Neutro Abs: 3.2 10*3/uL (ref 1.4–7.7)
Neutrophils Relative %: 58.1 % (ref 43.0–77.0)
Platelets: 167 10*3/uL (ref 150.0–400.0)
RBC: 4.91 Mil/uL (ref 4.22–5.81)
RDW: 13.7 % (ref 11.5–15.5)
WBC: 5.4 10*3/uL (ref 4.0–10.5)

## 2019-02-10 LAB — COMPREHENSIVE METABOLIC PANEL
ALT: 33 U/L (ref 0–53)
AST: 24 U/L (ref 0–37)
Albumin: 4.4 g/dL (ref 3.5–5.2)
Alkaline Phosphatase: 76 U/L (ref 39–117)
BUN: 14 mg/dL (ref 6–23)
CO2: 30 mEq/L (ref 19–32)
Calcium: 9.5 mg/dL (ref 8.4–10.5)
Chloride: 103 mEq/L (ref 96–112)
Creatinine, Ser: 0.88 mg/dL (ref 0.40–1.50)
GFR: 86.96 mL/min (ref >60.00)
Glucose, Bld: 131 mg/dL — ABNORMAL HIGH (ref 70–99)
Potassium: 3.8 mEq/L (ref 3.5–5.1)
Sodium: 141 mEq/L (ref 135–145)
Total Bilirubin: 1.6 mg/dL — ABNORMAL HIGH (ref 0.2–1.2)
Total Protein: 6.8 g/dL (ref 6.0–8.3)

## 2019-02-10 LAB — LIPID PANEL
Cholesterol: 108 mg/dL (ref 0–200)
HDL: 37.9 mg/dL — ABNORMAL LOW (ref >39.00)
LDL Cholesterol: 51 mg/dL (ref 0–99)
NonHDL: 70.18
Total CHOL/HDL Ratio: 3
Triglycerides: 98 mg/dL (ref 0.0–149.0)
VLDL: 19.6 mg/dL (ref 0.0–40.0)

## 2019-02-10 LAB — TSH: TSH: 1.37 u[IU]/mL (ref 0.35–4.50)

## 2019-02-17 ENCOUNTER — Other Ambulatory Visit: Payer: Self-pay

## 2019-02-17 ENCOUNTER — Encounter: Payer: Self-pay | Admitting: Family Medicine

## 2019-02-17 ENCOUNTER — Ambulatory Visit (INDEPENDENT_AMBULATORY_CARE_PROVIDER_SITE_OTHER): Payer: 59 | Admitting: Family Medicine

## 2019-02-17 VITALS — BP 134/86 | HR 91 | Temp 98.4°F | Ht 68.0 in | Wt 231.6 lb

## 2019-02-17 DIAGNOSIS — R972 Elevated prostate specific antigen [PSA]: Secondary | ICD-10-CM

## 2019-02-17 DIAGNOSIS — Z0000 Encounter for general adult medical examination without abnormal findings: Secondary | ICD-10-CM

## 2019-02-17 DIAGNOSIS — E1169 Type 2 diabetes mellitus with other specified complication: Secondary | ICD-10-CM

## 2019-02-17 DIAGNOSIS — I10 Essential (primary) hypertension: Secondary | ICD-10-CM | POA: Diagnosis not present

## 2019-02-17 DIAGNOSIS — E785 Hyperlipidemia, unspecified: Secondary | ICD-10-CM

## 2019-02-17 DIAGNOSIS — E119 Type 2 diabetes mellitus without complications: Secondary | ICD-10-CM

## 2019-02-17 DIAGNOSIS — Z6836 Body mass index (BMI) 36.0-36.9, adult: Secondary | ICD-10-CM

## 2019-02-17 DIAGNOSIS — N401 Enlarged prostate with lower urinary tract symptoms: Secondary | ICD-10-CM

## 2019-02-17 DIAGNOSIS — E786 Lipoprotein deficiency: Secondary | ICD-10-CM

## 2019-02-17 MED ORDER — AMLODIPINE BESYLATE 5 MG PO TABS: 5.0000 mg | ORAL_TABLET | Freq: Every day | ORAL | 3 refills | Status: DC

## 2019-02-17 MED ORDER — METFORMIN HCL 500 MG PO TABS: ORAL_TABLET | ORAL | 3 refills | Status: DC

## 2019-02-17 MED ORDER — ATORVASTATIN CALCIUM 10 MG PO TABS: 10.0000 mg | ORAL_TABLET | Freq: Every day | ORAL | 3 refills | Status: DC

## 2019-02-17 MED ORDER — LOSARTAN POTASSIUM-HCTZ 100-25 MG PO TABS: 1.0000 | ORAL_TABLET | Freq: Every day | ORAL | 3 refills | Status: DC

## 2019-02-17 NOTE — Patient Instructions (Addendum)
Get a flu shot in the fall  You will need a prevnar after you turn 63   Try to add exercise-work up to 30 or more minutes daily when you can   Eat a healthy diet    Wear sunscreen   Labs are stable

## 2019-02-17 NOTE — Progress Notes (Signed)
Subjective:    Patient ID: Daniel Lee, male    DOB: 08/08/1955, 63 y.o.   MRN: [id number]  HPI Here for health maintenance exam and to review chronic medical problems    Wt Readings from Last 3 Encounters:  02/17/19 231 lb 9 oz (105 kg)  01/10/19 238 lb (108 kg)  08/17/18 238 lb (108 kg)  down 7 lb since June  Is working on it  Eating better - a lot of salads  35.21 kg/m   Exercise - walking when not too hot   zostavax 11/16 Flu shot -in the fall  Tdap 7/18   He will get prevnar when 49   Eye exam 7/20  Colonoscopy 11/13 with 10 y recall   Prostate health- BPH Lab Results  Component Value Date   PSA 12.06 (H) 01/22/2017   PSA 10.86 (H) 06/28/2015   PSA 5.32 (H) 02/20/2014   had a cystoscopy He is back on finesteride (off flomax now)  Urination is stable-not problematic  Urology watches close    bp is stable today  No cp or palpitations or headaches or edema  No side effects to medicines  BP Readings from Last 3 Encounters:  02/17/19 134/86  01/10/19 (!) 178/83  08/17/18 (!) 149/86      DM2 Lab Results  Component Value Date   HGBA1C 6.8 (H) 02/10/2019  no changes On a statin   Hyperlipidemia Lab Results  Component Value Date   CHOL 108 02/10/2019   CHOL 99 07/29/2018   CHOL 113 01/27/2018   Lab Results  Component Value Date   HDL 37.90 (L) 02/10/2019   HDL 36.30 (L) 07/29/2018   HDL 38.00 (L) 01/27/2018   Lab Results  Component Value Date   LDLCALC 51 02/10/2019   LDLCALC 45 07/29/2018   LDLCALC 49 01/27/2018   Lab Results  Component Value Date   TRIG 98.0 02/10/2019   TRIG 87.0 07/29/2018   TRIG 129.0 01/27/2018   Lab Results  Component Value Date   CHOLHDL 3 02/10/2019   CHOLHDL 3 07/29/2018   CHOLHDL 3 01/27/2018   Lab Results  Component Value Date   LDLDIRECT 99.9 06/23/2011   Atorvastatin and diet  HDL is always low    Other labs Lab Results  Component Value Date   CREATININE 0.88 02/10/2019   BUN 14  02/10/2019   NA 141 02/10/2019   K 3.8 02/10/2019   CL 103 02/10/2019   CO2 30 02/10/2019   Lab Results  Component Value Date   ALT 33 02/10/2019   AST 24 02/10/2019   ALKPHOS 76 02/10/2019   BILITOT 1.6 (H) 02/10/2019   Lab Results  Component Value Date   WBC 5.4 02/10/2019   HGB 14.2 02/10/2019   HCT 43.1 02/10/2019   MCV 87.7 02/10/2019   PLT 167.0 02/10/2019   Lab Results  Component Value Date   TSH 1.37 02/10/2019    Patient Active Problem List   Diagnosis Date Noted  . Obesity 08/28/2013  . Routine general medical examination at a health care facility 06/22/2011  . ESSENTIAL HYPERTENSION, BENIGN 04/25/2009  . Hyperlipidemia associated with type 2 diabetes mellitus ([place]) 07/19/2008  . Diabetes type 2, controlled ([place]) 04/16/2008  . Low HDL (under 40) 03/18/2007  . ERECTILE DYSFUNCTION 10/07/2006  . PROSTATE SPECIFIC ANTIGEN, ELEVATED 10/07/2006  . BPH (benign prostatic hyperplasia) 10/07/2006   Past Medical History:  Diagnosis Date  . BPH (benign prostatic hypertrophy)   . Diabetes mellitus without  complication ([place])   . ED (erectile dysfunction)   . Elevated alanine aminotransferase (ALT) level    suspect fatty liver  . Hyperglycemia   . Hypertension    Past Surgical History:  Procedure Laterality Date  . abd ultrasound  11/2005   negative  . COLONOSCOPY    . HOLEP-LASER ENUCLEATION OF THE PROSTATE WITH MORCELLATION N/A 12/28/2017   Procedure: HOLEP-LASER ENUCLEATION OF THE PROSTATE WITH MORCELLATION;  Surgeon: Hollice Espy, MD;  Location: [facility] ORS;  Service: Urology;  Laterality: N/A;  . PROSTATE SURGERY  2003   prostate biopsy negative // urologist   Social History   Tobacco Use  . Smoking status: Former Smoker    Packs/day: 0.25    Types: Cigarettes    Quit date: 07/13/2001    Years since quitting: 17.6  . Smokeless tobacco: Former Systems developer    Types: Chew    Quit date: 07/13/2001  . Tobacco comment: smoked a cigarette every couple of days   Substance Use Topics  . Alcohol use: No    Alcohol/week: 0.0 standard drinks  . Drug use: No   Family History  Problem Relation Age of Onset  . Cancer Father        prostate cancer   . Diabetes Father   . Stroke Father   . Prostate cancer Father   . Hypertension Mother   . Hyperlipidemia Mother   . Bladder Cancer Mother   . Heart disease Brother   . Diabetes Brother   . Kidney disease Brother   . Cancer Paternal Uncle        prostate cancer  . Kidney cancer Neg Hx    No Known Allergies Current Outpatient Medications on File Prior to Visit  Medication Sig Dispense Refill  . aspirin 81 MG EC tablet Take 81 mg by mouth daily.     . finasteride (PROSCAR) 5 MG tablet Take 1 tablet (5 mg total) by mouth daily. 30 tablet 11  . glucose blood (ACCU-CHEK AVIVA PLUS) test strip USE TO CHECK BLOOD SUGAR ONCE DAILY AND AS NEEDED (Dx.  E11.9) 100 each 1  . Lancets (ACCU-CHEK MULTICLIX) lancets Use as instructed 100 each 12  . sildenafil (REVATIO) 20 MG tablet Take 1 tablet (20 mg total) by mouth as needed. Take 1-5 tabs as needed prior to intercourse 30 tablet 11   No current facility-administered medications on file prior to visit.       Review of Systems  Constitutional: Negative for activity change, appetite change, fatigue, fever and unexpected weight change.  HENT: Negative for congestion, rhinorrhea, sore throat and trouble swallowing.   Eyes: Negative for pain, redness, itching and visual disturbance.  Respiratory: Negative for cough, chest tightness, shortness of breath and wheezing.   Cardiovascular: Negative for chest pain and palpitations.  Gastrointestinal: Negative for abdominal pain, blood in stool, constipation, diarrhea and nausea.  Endocrine: Negative for cold intolerance, heat intolerance, polydipsia and polyuria.  Genitourinary: Negative for difficulty urinating, dysuria, frequency and urgency.  Musculoskeletal: Negative for arthralgias, joint swelling and myalgias.   Skin: Negative for pallor and rash.  Neurological: Negative for dizziness, tremors, weakness, numbness and headaches.  Hematological: Negative for adenopathy. Does not bruise/bleed easily.  Psychiatric/Behavioral: Negative for decreased concentration and dysphoric mood. The patient is not nervous/anxious.        Objective:   Physical Exam Constitutional:      General: He is not in acute distress.    Appearance: Normal appearance. He is well-developed. He is obese. He  is not ill-appearing.  HENT:     Head: Normocephalic and atraumatic.     Right Ear: Tympanic membrane, ear canal and external ear normal.     Left Ear: Tympanic membrane, ear canal and external ear normal.     Nose: Nose normal.     Mouth/Throat:     Mouth: Mucous membranes are moist.     Pharynx: No posterior oropharyngeal erythema.  Eyes:     General: No scleral icterus.       Right eye: No discharge.        Left eye: No discharge.     Conjunctiva/sclera: Conjunctivae normal.     Pupils: Pupils are equal, round, and reactive to light.  Neck:     Musculoskeletal: Normal range of motion and neck supple.     Thyroid: No thyromegaly.     Vascular: No carotid bruit or JVD.  Cardiovascular:     Rate and Rhythm: Regular rhythm. Tachycardia present.     Pulses: Normal pulses.     Heart sounds: Normal heart sounds. No gallop.   Pulmonary:     Effort: Pulmonary effort is normal. No respiratory distress.     Breath sounds: Normal breath sounds. No wheezing.     Comments: Good air exch Chest:     Chest wall: No tenderness.  Abdominal:     General: Bowel sounds are normal. There is no distension or abdominal bruit.     Palpations: Abdomen is soft. There is no mass.     Tenderness: There is no abdominal tenderness.     Hernia: No hernia is present.  Musculoskeletal:        General: No tenderness.     Right lower leg: No edema.     Left lower leg: No edema.  Lymphadenopathy:     Cervical: No cervical adenopathy.   Skin:    General: Skin is warm and dry.     Coloration: Skin is not pale.     Findings: No erythema or rash.     Comments: Solar lentigines diffusely Some angiomas  Neurological:     Mental Status: He is alert.     Cranial Nerves: No cranial nerve deficit.     Motor: No abnormal muscle tone.     Coordination: Coordination normal.     Gait: Gait normal.     Deep Tendon Reflexes: Reflexes are normal and symmetric. Reflexes normal.  Psychiatric:        Mood and Affect: Mood normal.        Cognition and Memory: Cognition and memory normal.           Assessment & Plan:   Problem List Items Addressed This Visit      Cardiovascular and Mediastinum   ESSENTIAL HYPERTENSION, BENIGN    bp in fair control at this time  BP Readings from Last 1 Encounters:  02/17/19 134/86   No changes needed Most recent labs reviewed  Disc lifstyle change with low sodium diet and exercise        Relevant Medications   losartan-hydrochlorothiazide (HYZAAR) 100-25 MG tablet   amLODipine (NORVASC) 5 MG tablet   atorvastatin (LIPITOR) 10 MG tablet     Endocrine   Diabetes type 2, controlled ([place])    Lab Results  Component Value Date   HGBA1C 6.8 (H) 02/10/2019  continues good control with metformin and diet  Enc wt loss On a statin and ARB F/u 6 mo      Relevant Medications  losartan-hydrochlorothiazide (HYZAAR) 100-25 MG tablet   metFORMIN (GLUCOPHAGE) 500 MG tablet   atorvastatin (LIPITOR) 10 MG tablet   Hyperlipidemia associated with type 2 diabetes mellitus ([facility])    With low HDL despite statin  Disc goals for lipids and reasons to control them Rev last labs with pt Rev low sat fat diet in detail  LDL at goal at 49      Relevant Medications   losartan-hydrochlorothiazide (HYZAAR) 100-25 MG tablet   metFORMIN (GLUCOPHAGE) 500 MG tablet   atorvastatin (LIPITOR) 10 MG tablet     Genitourinary   BPH (benign prostatic hyperplasia)     Other   Low HDL (under 40)    Disc  goals for lipids and reasons to control them Rev last labs with pt Rev low sat fat diet in detail  Continues atorvastatin  Disc exercise/fish oil for HDL      PROSTATE SPECIFIC ANTIGEN, ELEVATED    Due to BPH Treated /monitored by urology      Routine general medical examination at a health care facility - Primary    Reviewed health habits including diet and exercise and skin cancer prevention Reviewed appropriate screening tests for age  Also reviewed health mt list, fam hx and immunization status , as well as social and family history   See HPI Labs reviewed Plans to get a flu shot in the fall  Also prevnar after he turns 44 in the fall Continues urology f/u for BPH with elevated psa  Disc plan for exercise and enc use of sunscreen       Obesity    Discussed how this problem influences overall health and the risks it imposes  Reviewed plan for weight loss with lower calorie diet (via better food choices and also portion control or program like weight watchers) and exercise building up to or more than 30 minutes 5 days per week including some aerobic activity         Relevant Medications   metFORMIN (GLUCOPHAGE) 500 MG tablet

## 2019-02-19 NOTE — Assessment & Plan Note (Signed)
Due to BPH Treated /monitored by urology

## 2019-02-19 NOTE — Assessment & Plan Note (Signed)
Reviewed health habits including diet and exercise and skin cancer prevention Reviewed appropriate screening tests for age  Also reviewed health mt list, fam hx and immunization status , as well as social and family history   See HPI Labs reviewed Plans to get a flu shot in the fall  Also prevnar after he turns 71 in the fall Continues urology f/u for BPH with elevated psa  Disc plan for exercise and enc use of sunscreen

## 2019-02-19 NOTE — Assessment & Plan Note (Signed)
Discussed how this problem influences overall health and the risks it imposes  Reviewed plan for weight loss with lower calorie diet (via better food choices and also portion control or program like weight watchers) and exercise building up to or more than 30 minutes 5 days per week including some aerobic activity    

## 2019-02-19 NOTE — Assessment & Plan Note (Signed)
bp in fair control at this time  BP Readings from Last 1 Encounters:  02/17/19 134/86   No changes needed Most recent labs reviewed  Disc lifstyle change with low sodium diet and exercise

## 2019-02-19 NOTE — Assessment & Plan Note (Signed)
Disc goals for lipids and reasons to control them Rev last labs with pt Rev low sat fat diet in detail  Continues atorvastatin  Disc exercise/fish oil for HDL

## 2019-02-19 NOTE — Assessment & Plan Note (Signed)
With low HDL despite statin  Disc goals for lipids and reasons to control them Rev last labs with pt Rev low sat fat diet in detail  LDL at goal at 49

## 2019-02-19 NOTE — Assessment & Plan Note (Signed)
Lab Results  Component Value Date   HGBA1C 6.8 (H) 02/10/2019  continues good control with metformin and diet  Enc wt loss On a statin and ARB F/u 6 mo

## 2019-03-27 ENCOUNTER — Other Ambulatory Visit: Payer: Self-pay | Admitting: Family Medicine

## 2019-06-20 ENCOUNTER — Encounter: Payer: Self-pay | Admitting: Urology

## 2019-06-20 ENCOUNTER — Ambulatory Visit (INDEPENDENT_AMBULATORY_CARE_PROVIDER_SITE_OTHER): Payer: 59 | Admitting: Urology

## 2019-06-20 ENCOUNTER — Other Ambulatory Visit: Payer: Self-pay

## 2019-06-20 VITALS — BP 156/90 | HR 90 | Ht 68.0 in | Wt 232.0 lb

## 2019-06-20 DIAGNOSIS — N138 Other obstructive and reflux uropathy: Secondary | ICD-10-CM

## 2019-06-20 DIAGNOSIS — R972 Elevated prostate specific antigen [PSA]: Secondary | ICD-10-CM

## 2019-06-20 DIAGNOSIS — N401 Enlarged prostate with lower urinary tract symptoms: Secondary | ICD-10-CM | POA: Diagnosis not present

## 2019-06-20 DIAGNOSIS — R310 Gross hematuria: Secondary | ICD-10-CM

## 2019-06-20 LAB — URINALYSIS, COMPLETE
Bilirubin, UA: NEGATIVE
Glucose, UA: NEGATIVE
Ketones, UA: NEGATIVE
Leukocytes,UA: NEGATIVE
Nitrite, UA: NEGATIVE
Specific Gravity, UA: 1.025 (ref 1.005–1.030)
Urobilinogen, Ur: 1 mg/dL (ref 0.2–1.0)
pH, UA: 6 (ref 5.0–7.5)

## 2019-06-20 LAB — MICROSCOPIC EXAMINATION: Bacteria, UA: NONE SEEN

## 2019-06-20 LAB — BLADDER SCAN AMB NON-IMAGING: Scan Result: 22

## 2019-06-20 NOTE — Progress Notes (Signed)
06/20/2019 4:39 PM   Everlene Other Ard [birth year] [id number]  Referring provider: Abner Greenspan, MD [address] [place],  [place] [address]  Chief Complaint  Patient presents with  . Hematuria    HPI: 63 year old male with a personal history of BPH/prostamegaly and gross hematuria who returns today for routine 30-month follow-up.   He returns today earlier than scheduled due to an episode of painless gross hematuria the last about 3 days.  He reports that he was doing really heavy activities including chopping of trees and more aggressive lifting and moving at work.  On Friday morning, he woke up and was having gross hematuria with a few small clots.  This persisted in the mornings only for about 3 days and then subsided.  He had no other associated symptoms.  He does have some urinary urgency at baseline has difficulty holding it.  Otherwise, he has a good flow.  IPSS as below.  No dysuria.  No further gross hematuria other than listed above.  He underwent holmium laser enucleation of the prostate on 619 with a 97 g resection.  In March 2020, he began experiencing episodes of gross hematuria associated with heavy lifting.  He underwent hematuria evaluation including cystoscopy which showed prostatic regrowth and friable prostatic mucosa likely the source of his gross hematuria.  He has had a CT urogram which showed prostamegaly but otherwise was unremarkable.  He was started on finasteride primary for gross hematuria episodes about 6 months ago.  No recent PSAs.  IPSS    Row Name 06/20/19 1600         International Prostate Symptom Score   How often have you had the sensation of not emptying your bladder?  Less than 1 in 5     How often have you had to urinate less than every two hours?  About half the time     How often have you found you stopped and started again several times when you urinated?  Not at All     How often have you found it difficult to postpone  urination?  Less than 1 in 5 times     How often have you had a weak urinary stream?  Not at All     How often have you had to strain to start urination?  Not at All     How many times did you typically get up at night to urinate?  1 Time     Total IPSS Score  6       Quality of Life due to urinary symptoms   If you were to spend the rest of your life with your urinary condition just the way it is now how would you feel about that?  Mostly Satisfied        Score:  1-7 Mild 8-19 Moderate 20-35 Severe    PMH: Past Medical History:  Diagnosis Date  . BPH (benign prostatic hypertrophy)   . Diabetes mellitus without complication ([place])   . ED (erectile dysfunction)   . Elevated alanine aminotransferase (ALT) level    suspect fatty liver  . Hyperglycemia   . Hypertension     Surgical History: Past Surgical History:  Procedure Laterality Date  . abd ultrasound  11/2005   negative  . COLONOSCOPY    . HOLEP-LASER ENUCLEATION OF THE PROSTATE WITH MORCELLATION N/A 12/28/2017   Procedure: HOLEP-LASER ENUCLEATION OF THE PROSTATE WITH MORCELLATION;  Surgeon: Hollice Espy, MD;  Location: [facility] ORS;  Service: Urology;  Laterality: N/A;  . PROSTATE SURGERY  2003   prostate biopsy negative // urologist    Home Medications:  Allergies as of 06/20/2019   No Known Allergies     Medication List       Accurate as of June 20, 2019  4:39 PM. If you have any questions, ask your nurse or doctor.        Accu-Chek Aviva Plus test strip Generic drug: glucose blood USE TO CHECK BLOOD SUGAR ONCE DAILY AND AS NEEDED (DX. E11.9)   accu-chek multiclix lancets Use as instructed   amLODipine 5 MG tablet Commonly known as: NORVASC Take 1 tablet (5 mg total) by mouth daily.   aspirin 81 MG EC tablet Take 81 mg by mouth daily.   atorvastatin 10 MG tablet Commonly known as: LIPITOR Take 1 tablet (10 mg total) by mouth daily.   finasteride 5 MG tablet Commonly known as: PROSCAR Take  1 tablet (5 mg total) by mouth daily.   losartan-hydrochlorothiazide 100-25 MG tablet Commonly known as: HYZAAR Take 1 tablet by mouth daily.   metFORMIN 500 MG tablet Commonly known as: GLUCOPHAGE TAKE 1 TABLET BY MOUTH TWICE A DAY WITH A MEAL   sildenafil 20 MG tablet Commonly known as: Revatio Take 1 tablet (20 mg total) by mouth as needed. Take 1-5 tabs as needed prior to intercourse       Allergies: No Known Allergies  Family History: Family History  Problem Relation Age of Onset  . Cancer Father        prostate cancer   . Diabetes Father   . Stroke Father   . Prostate cancer Father   . Hypertension Mother   . Hyperlipidemia Mother   . Bladder Cancer Mother   . Heart disease Brother   . Diabetes Brother   . Kidney disease Brother   . Cancer Paternal Uncle        prostate cancer  . Kidney cancer Neg Hx     Social History:  reports that he quit smoking about 17 years ago. His smoking use included cigarettes. He smoked 0.25 packs per day. He quit smokeless tobacco use about 17 years ago.  His smokeless tobacco use included chew. He reports that he does not drink alcohol or use drugs.  ROS: UROLOGY Frequent Urination?: No Hard to postpone urination?: No Burning/pain with urination?: No Get up at night to urinate?: No Leakage of urine?: No Urine stream starts and stops?: No Trouble starting stream?: No Do you have to strain to urinate?: No Blood in urine?: Yes Urinary tract infection?: No Sexually transmitted disease?: No Injury to kidneys or bladder?: No Painful intercourse?: No Weak stream?: No Erection problems?: No Penile pain?: No  Gastrointestinal Nausea?: No Vomiting?: No Indigestion/heartburn?: No Diarrhea?: No Constipation?: No  Constitutional Fever: No Night sweats?: No Weight loss?: No Fatigue?: No  Skin Skin rash/lesions?: No Itching?: No  Eyes Blurred vision?: No Double vision?: No  Ears/Nose/Throat Sore throat?: No Sinus  problems?: No  Hematologic/Lymphatic Swollen glands?: No Easy bruising?: No  Cardiovascular Leg swelling?: No Chest pain?: No  Respiratory Cough?: No Shortness of breath?: No  Endocrine Excessive thirst?: No  Musculoskeletal Back pain?: No Joint pain?: No  Neurological Headaches?: No Dizziness?: No  Psychologic Depression?: No Anxiety?: No  Physical Exam: BP (!) 156/90   Pulse 90   Ht 5\' 8"  (1.727 m)   Wt 232 lb (105.2 kg)   BMI 35.28 kg/m   Constitutional:  Alert and  oriented, No acute distress. HEENT: [place] AT, moist mucus membranes.  Trachea midline, no masses. Cardiovascular: No clubbing, cyanosis, or edema. Respiratory: Normal respiratory effort, no increased work of breathing. GI: Abdomen is soft, nontender, nondistended, no abdominal masses, obese. Skin: No rashes, bruises or suspicious lesions. Neurologic: Grossly intact, no focal deficits, moving all 4 extremities. Psychiatric: Normal mood and affect.  Laboratory Data: Lab Results  Component Value Date   WBC 5.4 02/10/2019   HGB 14.2 02/10/2019   HCT 43.1 02/10/2019   MCV 87.7 02/10/2019   PLT 167.0 02/10/2019    Lab Results  Component Value Date   CREATININE 0.88 02/10/2019    Lab Results  Component Value Date   HGBA1C 6.8 (H) 02/10/2019    Urinalysis UA with 3-10 RBC/ HPF WBC 11-30   Pertinent Imaging: Results for orders placed or performed in visit on 06/20/19  Microscopic Examination   URINE  Result Value Ref Range   WBC, UA 11-30 (A) 0 - 5 /hpf   RBC 3-10 (A) 0 - 2 /hpf   Epithelial Cells (non renal) 0-10 0 - 10 /hpf   Bacteria, UA None seen None seen/Few  Urinalysis, Complete  Result Value Ref Range   Specific Gravity, UA 1.025 1.005 - 1.030   pH, UA 6.0 5.0 - 7.5   Color, UA Yellow Yellow   Appearance Ur Hazy (A) Clear   Leukocytes,UA Negative Negative   Protein,UA 2+ (A) Negative/Trace   Glucose, UA Negative Negative   Ketones, UA Negative Negative   RBC, UA Trace  (A) Negative   Bilirubin, UA Negative Negative   Urobilinogen, Ur 1.0 0.2 - 1.0 mg/dL   Nitrite, UA Negative Negative   Microscopic Examination See below:   Bladder Scan (Post Void Residual) in office  Result Value Ref Range   Scan Result 22     Assessment & Plan:    1. Gross hematuria Status post complete work-up earlier this year which showed friable prostatic mucosa, likely the source of bleeding on this occasion as well  I recommended that if he continues have gross hematuria episodes, would consider cystoscopy again down the road  Urine culture sent today given the presence of white blood cells to rule out underlying infection.  Advised to call if he has recurrent episodes or increasing frequency or any other associated symptoms - Urinalysis, Complete - PSA - Bladder Scan (Post Void Residual) in office - Urine Culture, Comprehensive  2. BPH with urinary obstruction Relatively asymptomatic, symptoms well controlled  Continue finasteride both for BPH as well as prostatic bleeding  3. Elevated PSA Personal history of elevated PSA, repeat pending today  Anticipate PSA reduction with initiation of finasteride about 6 months ago    Return for cancel f/u in 2 months, reschedule in 6 months with MD for possible cysto.  Hollice Espy, MD  [facility] [address], [place],  [address] [phone]

## 2019-06-21 ENCOUNTER — Telehealth: Payer: Self-pay | Admitting: *Deleted

## 2019-06-21 LAB — PSA: Prostate Specific Ag, Serum: 4.2 ng/mL — ABNORMAL HIGH (ref 0.0–4.0)

## 2019-06-21 NOTE — Telephone Encounter (Addendum)
Patient informed-verbalized understanding.  ----- Message from Hollice Espy, MD sent at 06/21/2019  8:30 AM EST ----- PSA is 4.2 which is lower than previous, great news!  Hollice Espy, MD

## 2019-06-23 ENCOUNTER — Telehealth: Payer: Self-pay

## 2019-06-23 LAB — CULTURE, URINE COMPREHENSIVE

## 2019-06-23 MED ORDER — SULFAMETHOXAZOLE-TRIMETHOPRIM 800-160 MG PO TABS: 1.0000 | ORAL_TABLET | Freq: Two times a day (BID) | ORAL | 0 refills | Status: DC

## 2019-06-23 NOTE — Telephone Encounter (Signed)
-----   Message from Hollice Espy, MD sent at 06/23/2019  1:46 PM EST ----- This patient is growing E. coli in his urine.  This may explain why he had this episode of bleeding perhaps.  To treat this with Bactrim DS twice daily for 7 days.  Hollice Espy, MD

## 2019-06-23 NOTE — Telephone Encounter (Signed)
Patient notified and script sent to pharmacy 

## 2019-07-29 ENCOUNTER — Other Ambulatory Visit: Payer: Self-pay | Admitting: Family Medicine

## 2019-08-16 ENCOUNTER — Telehealth: Payer: Self-pay | Admitting: Family Medicine

## 2019-08-16 DIAGNOSIS — I10 Essential (primary) hypertension: Secondary | ICD-10-CM

## 2019-08-16 DIAGNOSIS — E1169 Type 2 diabetes mellitus with other specified complication: Secondary | ICD-10-CM

## 2019-08-16 DIAGNOSIS — E785 Hyperlipidemia, unspecified: Secondary | ICD-10-CM

## 2019-08-16 DIAGNOSIS — E119 Type 2 diabetes mellitus without complications: Secondary | ICD-10-CM

## 2019-08-16 NOTE — Telephone Encounter (Signed)
-----   Message from Cloyd Stagers, RT sent at 08/04/2019  2:53 PM EST ----- Regarding: Lab Orders For Thursday 2.4.2021 Please place lab orders for Thursday 2.4.2021, office visit for 6 month f/u on Friday 2.12.2021 Thank you, Dyke Maes RT(R)

## 2019-08-17 ENCOUNTER — Other Ambulatory Visit: Payer: Self-pay

## 2019-08-17 ENCOUNTER — Other Ambulatory Visit (INDEPENDENT_AMBULATORY_CARE_PROVIDER_SITE_OTHER): Payer: 59

## 2019-08-17 DIAGNOSIS — E1169 Type 2 diabetes mellitus with other specified complication: Secondary | ICD-10-CM | POA: Diagnosis not present

## 2019-08-17 DIAGNOSIS — I10 Essential (primary) hypertension: Secondary | ICD-10-CM | POA: Diagnosis not present

## 2019-08-17 DIAGNOSIS — N138 Other obstructive and reflux uropathy: Secondary | ICD-10-CM

## 2019-08-17 DIAGNOSIS — E785 Hyperlipidemia, unspecified: Secondary | ICD-10-CM | POA: Diagnosis not present

## 2019-08-17 DIAGNOSIS — N401 Enlarged prostate with lower urinary tract symptoms: Secondary | ICD-10-CM

## 2019-08-17 DIAGNOSIS — E119 Type 2 diabetes mellitus without complications: Secondary | ICD-10-CM

## 2019-08-17 LAB — COMPREHENSIVE METABOLIC PANEL WITH GFR
ALT: 32 U/L (ref 0–53)
AST: 21 U/L (ref 0–37)
Albumin: 4.4 g/dL (ref 3.5–5.2)
Alkaline Phosphatase: 83 U/L (ref 39–117)
BUN: 17 mg/dL (ref 6–23)
CO2: 29 meq/L (ref 19–32)
Calcium: 9.4 mg/dL (ref 8.4–10.5)
Chloride: 103 meq/L (ref 96–112)
Creatinine, Ser: 0.91 mg/dL (ref 0.40–1.50)
GFR: 83.53 mL/min (ref >60.00)
Glucose, Bld: 138 mg/dL — ABNORMAL HIGH (ref 70–99)
Potassium: 3.8 meq/L (ref 3.5–5.1)
Sodium: 139 meq/L (ref 135–145)
Total Bilirubin: 1.3 mg/dL — ABNORMAL HIGH (ref 0.2–1.2)
Total Protein: 7.1 g/dL (ref 6.0–8.3)

## 2019-08-17 LAB — LIPID PANEL
Cholesterol: 95 mg/dL (ref 0–200)
HDL: 34.3 mg/dL — ABNORMAL LOW (ref >39.00)
LDL Cholesterol: 43 mg/dL (ref 0–99)
NonHDL: 61.19
Total CHOL/HDL Ratio: 3
Triglycerides: 89 mg/dL (ref 0.0–149.0)
VLDL: 17.8 mg/dL (ref 0.0–40.0)

## 2019-08-17 LAB — HEMOGLOBIN A1C: Hgb A1c MFr Bld: 6.6 % — ABNORMAL HIGH (ref 4.6–6.5)

## 2019-08-18 ENCOUNTER — Other Ambulatory Visit: Payer: 59

## 2019-08-22 ENCOUNTER — Ambulatory Visit: Payer: 59 | Admitting: Urology

## 2019-08-25 ENCOUNTER — Encounter: Payer: Self-pay | Admitting: Family Medicine

## 2019-08-25 ENCOUNTER — Other Ambulatory Visit: Payer: Self-pay

## 2019-08-25 ENCOUNTER — Ambulatory Visit (INDEPENDENT_AMBULATORY_CARE_PROVIDER_SITE_OTHER): Payer: 59 | Admitting: Family Medicine

## 2019-08-25 VITALS — BP 138/86 | HR 89 | Temp 98.0°F | Ht 68.0 in | Wt 235.2 lb

## 2019-08-25 DIAGNOSIS — E1169 Type 2 diabetes mellitus with other specified complication: Secondary | ICD-10-CM | POA: Diagnosis not present

## 2019-08-25 DIAGNOSIS — E119 Type 2 diabetes mellitus without complications: Secondary | ICD-10-CM

## 2019-08-25 DIAGNOSIS — E786 Lipoprotein deficiency: Secondary | ICD-10-CM

## 2019-08-25 DIAGNOSIS — I10 Essential (primary) hypertension: Secondary | ICD-10-CM

## 2019-08-25 DIAGNOSIS — Z6835 Body mass index (BMI) 35.0-35.9, adult: Secondary | ICD-10-CM

## 2019-08-25 DIAGNOSIS — E785 Hyperlipidemia, unspecified: Secondary | ICD-10-CM

## 2019-08-25 NOTE — Progress Notes (Signed)
Subjective:    Patient ID: Daniel Lee, male    DOB: Sep 11, 1955, 64 y.o.   MRN: [id number]  This visit occurred during the SARS-CoV-2 public health emergency.  Safety protocols were in place, including screening questions prior to the visit, additional usage of staff PPE, and extensive cleaning of exam room while observing appropriate contact time as indicated for disinfecting solutions.    HPI Here for 6 mo f/u of chronic medical problems  Wt Readings from Last 3 Encounters:  08/25/19 235 lb 4 oz (106.7 kg)  06/20/19 232 lb (105.2 kg)  02/17/19 231 lb 9 oz (105 kg)  fairly stable - wants to work on weight  35.77 kg/m    Working- does have to eat out sometimes-tries to eat salads/grilled chicken    He wants to hold off on prevnar because he is getting his covid vaccine tomorrow  bp is stable today  No cp or palpitations or headaches or edema  No side effects to medicines  BP Readings from Last 3 Encounters:  08/25/19 138/86  06/20/19 (!) 156/90  02/17/19 134/86     DM2 Lab Results  Component Value Date   HGBA1C 6.6 (H) 08/17/2019  on metformin  No big highs or lows  This is down from 6.8  On statin and arb  Eye exam 7/20 He eats some sweets    Lab Results  Component Value Date   CREATININE 0.91 08/17/2019   BUN 17 08/17/2019   NA 139 08/17/2019   K 3.8 08/17/2019   CL 103 08/17/2019   CO2 29 08/17/2019   Lab Results  Component Value Date   ALT 32 08/17/2019   AST 21 08/17/2019   ALKPHOS 83 08/17/2019   BILITOT 1.3 (H) 08/17/2019      Hyperlipidemia Lab Results  Component Value Date   CHOL 95 08/17/2019   CHOL 108 02/10/2019   CHOL 99 07/29/2018   Lab Results  Component Value Date   HDL 34.30 (L) 08/17/2019   HDL 37.90 (L) 02/10/2019   HDL 36.30 (L) 07/29/2018   Lab Results  Component Value Date   LDLCALC 43 08/17/2019   LDLCALC 51 02/10/2019   LDLCALC 45 07/29/2018   Lab Results  Component Value Date   TRIG 89.0 08/17/2019   TRIG 98.0 02/10/2019   TRIG 87.0 07/29/2018   Lab Results  Component Value Date   CHOLHDL 3 08/17/2019   CHOLHDL 3 02/10/2019   CHOLHDL 3 07/29/2018   Lab Results  Component Value Date   LDLDIRECT 99.9 06/23/2011   Taking atorvastatin  LDL down to 43 HDL down to 34  Tries to get 8000-10,000 steps per day  A little muscle pain in pelvis and legs  Diet-  Watches what he eats   Patient Active Problem List   Diagnosis Date Noted  . Obesity 08/28/2013  . Routine general medical examination at a health care facility 06/22/2011  . ESSENTIAL HYPERTENSION, BENIGN 04/25/2009  . Hyperlipidemia associated with type 2 diabetes mellitus ([place]) 07/19/2008  . Diabetes type 2, controlled ([place]) 04/16/2008  . Low HDL (under 40) 03/18/2007  . ERECTILE DYSFUNCTION 10/07/2006  . PROSTATE SPECIFIC ANTIGEN, ELEVATED 10/07/2006  . BPH (benign prostatic hyperplasia) 10/07/2006   Past Medical History:  Diagnosis Date  . BPH (benign prostatic hypertrophy)   . Diabetes mellitus without complication ([place])   . ED (erectile dysfunction)   . Elevated alanine aminotransferase (ALT) level    suspect fatty liver  . Hyperglycemia   .  Hypertension    Past Surgical History:  Procedure Laterality Date  . abd ultrasound  11/2005   negative  . COLONOSCOPY    . HOLEP-LASER ENUCLEATION OF THE PROSTATE WITH MORCELLATION N/A 12/28/2017   Procedure: HOLEP-LASER ENUCLEATION OF THE PROSTATE WITH MORCELLATION;  Surgeon: Hollice Espy, MD;  Location: [facility] ORS;  Service: Urology;  Laterality: N/A;  . PROSTATE SURGERY  2003   prostate biopsy negative // urologist   Social History   Tobacco Use  . Smoking status: Former Smoker    Packs/day: 0.25    Types: Cigarettes    Quit date: 07/13/2001    Years since quitting: 18.1  . Smokeless tobacco: Former Systems developer    Types: Chew    Quit date: 07/13/2001  . Tobacco comment: smoked a cigarette every couple of days  Substance Use Topics  . Alcohol use: No    Alcohol/week:  0.0 standard drinks  . Drug use: No   Family History  Problem Relation Age of Onset  . Cancer Father        prostate cancer   . Diabetes Father   . Stroke Father   . Prostate cancer Father   . Hypertension Mother   . Hyperlipidemia Mother   . Bladder Cancer Mother   . Heart disease Brother   . Diabetes Brother   . Kidney disease Brother   . Cancer Paternal Uncle        prostate cancer  . Kidney cancer Neg Hx    No Known Allergies Current Outpatient Medications on File Prior to Visit  Medication Sig Dispense Refill  . ACCU-CHEK AVIVA PLUS test strip USE TO CHECK BLOOD SUGAR ONCE DAILY AND AS NEEDED (DX. E11.9) 100 strip 2  . amLODipine (NORVASC) 5 MG tablet Take 1 tablet (5 mg total) by mouth daily. 90 tablet 3  . aspirin 81 MG EC tablet Take 81 mg by mouth daily.     Marland Kitchen atorvastatin (LIPITOR) 10 MG tablet Take 1 tablet (10 mg total) by mouth daily. 90 tablet 3  . finasteride (PROSCAR) 5 MG tablet Take 1 tablet (5 mg total) by mouth daily. 30 tablet 11  . Lancets (ACCU-CHEK MULTICLIX) lancets Use as instructed 100 each 12  . losartan-hydrochlorothiazide (HYZAAR) 100-25 MG tablet Take 1 tablet by mouth daily. 90 tablet 3  . metFORMIN (GLUCOPHAGE) 500 MG tablet TAKE 1 TABLET BY MOUTH TWICE A DAY WITH A MEAL 180 tablet 3  . sildenafil (REVATIO) 20 MG tablet Take 1 tablet (20 mg total) by mouth as needed. Take 1-5 tabs as needed prior to intercourse 30 tablet 11   No current facility-administered medications on file prior to visit.    Review of Systems  Constitutional: Negative for activity change, appetite change, fatigue, fever and unexpected weight change.  HENT: Negative for congestion, rhinorrhea, sore throat and trouble swallowing.   Eyes: Negative for pain, redness, itching and visual disturbance.  Respiratory: Negative for cough, chest tightness, shortness of breath and wheezing.   Cardiovascular: Negative for chest pain and palpitations.  Gastrointestinal: Negative for  abdominal pain, blood in stool, constipation, diarrhea and nausea.  Endocrine: Negative for cold intolerance, heat intolerance, polydipsia and polyuria.  Genitourinary: Negative for difficulty urinating, dysuria, frequency and urgency.  Musculoskeletal: Negative for arthralgias, joint swelling and myalgias.  Skin: Negative for pallor and rash.  Neurological: Negative for dizziness, tremors, weakness, numbness and headaches.  Hematological: Negative for adenopathy. Does not bruise/bleed easily.  Psychiatric/Behavioral: Negative for decreased concentration and dysphoric mood. The patient  is not nervous/anxious.        Objective:   Physical Exam Constitutional:      General: He is not in acute distress.    Appearance: Normal appearance. He is well-developed. He is obese.  HENT:     Head: Normocephalic and atraumatic.  Eyes:     Conjunctiva/sclera: Conjunctivae normal.     Pupils: Pupils are equal, round, and reactive to light.  Neck:     Thyroid: No thyromegaly.     Vascular: No carotid bruit or JVD.  Cardiovascular:     Rate and Rhythm: Normal rate and regular rhythm.     Heart sounds: Normal heart sounds. No gallop.   Pulmonary:     Effort: Pulmonary effort is normal. No respiratory distress.     Breath sounds: Normal breath sounds. No wheezing or rales.  Abdominal:     General: Bowel sounds are normal. There is no distension or abdominal bruit.     Palpations: Abdomen is soft. There is no mass.     Tenderness: There is no abdominal tenderness.  Musculoskeletal:     Cervical back: Normal range of motion and neck supple.  Lymphadenopathy:     Cervical: No cervical adenopathy.  Skin:    General: Skin is warm and dry.     Findings: No rash.  Neurological:     Mental Status: He is alert. Mental status is at baseline.     Cranial Nerves: No cranial nerve deficit.     Motor: No weakness.     Gait: Gait normal.     Deep Tendon Reflexes: Reflexes are normal and symmetric.    Psychiatric:        Mood and Affect: Mood normal.           Assessment & Plan:   Problem List Items Addressed This Visit      Cardiovascular and Mediastinum   ESSENTIAL HYPERTENSION, BENIGN    bp in fair control at this time  BP Readings from Last 1 Encounters:  08/25/19 138/86   No changes needed Most recent labs reviewed  Disc lifstyle change with low sodium diet and exercise  Enc wt loss         Endocrine   Diabetes type 2, controlled ([place]) - Primary    Lab Results  Component Value Date   HGBA1C 6.6 (H) 08/17/2019   Down from 6.8  Good control  Continues metformin  Also ace and statin  Eye exam utd Good foot care Enc wt loss  F/u in 6 mo      Hyperlipidemia associated with type 2 diabetes mellitus ([place])    Disc goals for lipids and reasons to control them Rev last labs with pt Rev low sat fat diet in detail LDL well controlled  HDL is low-enc exercise and omega 3 intake        Other   Low HDL (under 40)    Enc exercise and omega 3 intake  HDL in 30s      RESOLVED: Obesity    Discussed how this problem influences overall health and the risks it imposes  Reviewed plan for weight loss with lower calorie diet (via better food choices and also portion control or program like weight watchers) and exercise building up to or more than 30 minutes 5 days per week including some aerobic activity

## 2019-08-25 NOTE — Patient Instructions (Addendum)
For diabetes and weight loss Try to get most of your carbohydrates from produce (with the exception of white potatoes)  Eat less bread/pasta/rice/snack foods/cereals/sweets and other items from the middle of the grocery store (processed carbs)   For cholesterol  Avoid red meat/ fried foods/ egg yolks/ fatty breakfast meats/ butter, cheese and high fat dairy/ and shellfish    Exercise to get HDL up

## 2019-08-26 ENCOUNTER — Ambulatory Visit: Payer: 59 | Attending: Internal Medicine

## 2019-08-26 DIAGNOSIS — Z23 Encounter for immunization: Secondary | ICD-10-CM

## 2019-08-26 NOTE — Assessment & Plan Note (Signed)
Disc goals for lipids and reasons to control them Rev last labs with pt Rev low sat fat diet in detail LDL well controlled  HDL is low-enc exercise and omega 3 intake

## 2019-08-26 NOTE — Assessment & Plan Note (Signed)
bp in fair control at this time  BP Readings from Last 1 Encounters:  08/25/19 138/86   No changes needed Most recent labs reviewed  Disc lifstyle change with low sodium diet and exercise  Enc wt loss

## 2019-08-26 NOTE — Assessment & Plan Note (Signed)
Enc exercise and omega 3 intake  HDL in 30s

## 2019-08-26 NOTE — Assessment & Plan Note (Signed)
Discussed how this problem influences overall health and the risks it imposes  Reviewed plan for weight loss with lower calorie diet (via better food choices and also portion control or program like weight watchers) and exercise building up to or more than 30 minutes 5 days per week including some aerobic activity    

## 2019-08-26 NOTE — Assessment & Plan Note (Signed)
Lab Results  Component Value Date   HGBA1C 6.6 (H) 08/17/2019   Down from 6.8  Good control  Continues metformin  Also ace and statin  Eye exam utd Good foot care Enc wt loss  F/u in 6 mo

## 2019-08-26 NOTE — Progress Notes (Signed)
   Covid-19 Vaccination Clinic  Name:  Daniel Lee    MRN: [id number] DOB: 12-28-1955  08/26/2019  Mr. [name] was observed post Covid-19 immunization for 15 minutes without incidence. He was provided with Vaccine Information Sheet and instruction to access the V-Safe system.   Mr. [name] was instructed to call 911 with any severe reactions post vaccine: Marland Kitchen Difficulty breathing  . Swelling of your face and throat  . A fast heartbeat  . A bad rash all over your body  . Dizziness and weakness    Immunizations Administered    Name Date Dose VIS Date Route   Pfizer COVID-19 Vaccine 08/26/2019 10:05 AM 0.3 mL 06/23/2019 Intramuscular   Manufacturer: [place]   Lot: [id number]   [place]: [id number]

## 2019-09-16 ENCOUNTER — Ambulatory Visit: Payer: 59 | Attending: Internal Medicine

## 2019-09-16 DIAGNOSIS — Z23 Encounter for immunization: Secondary | ICD-10-CM

## 2019-09-16 NOTE — Progress Notes (Signed)
   Covid-19 Vaccination Clinic  Name:  Daniel Lee    MRN: [id number] DOB: 1956/06/26  09/16/2019  [name] was observed post Covid-19 immunization for 15 minutes without incident. He was provided with Vaccine Information Sheet and instruction to access the V-Safe system.   [name] was instructed to call 911 with any severe reactions post vaccine: Marland Kitchen Difficulty breathing  . Swelling of face and throat  . A fast heartbeat  . A bad rash all over body  . Dizziness and weakness   Immunizations Administered    Name Date Dose VIS Date Route   Pfizer COVID-19 Vaccine 09/16/2019  9:42 AM 0.3 mL 06/23/2019 Intramuscular   Manufacturer: [place]   Lot: [id number]   [place]: [id number]

## 2019-11-01 ENCOUNTER — Other Ambulatory Visit: Payer: Self-pay | Admitting: Urology

## 2019-12-18 NOTE — Progress Notes (Signed)
   12/19/19   CC:  Chief Complaint  Patient presents with  . Cysto    HPI: Daniel Lee is a 64 y.o. M with a personal history of BPH/prostamegaly and gross hematuria who returns today for a 6 month cysto.   In March 2020, he began experiencing episodes of gross hematuria associated with heavy lifting.  He underwent hematuria evaluation including cystoscopy which showed prostatic regrowth and friable prostatic mucosa likely the source of his gross hematuria.  He has had a CT urogram which showed prostamegaly but otherwise was unremarkable.  He is still taking the finasteride as previously prescribed. He is voiding well w/o difficulties.   UA today negative.   Today's Vitals   12/19/19 1021  BP: (!) 190/97  Pulse: (!) 109   There is no height or weight on file to calculate BMI. NED. A&Ox3.   No respiratory distress   Abd soft, NT, ND Normal phallus with bilateral descended testicles  Cystoscopy Procedure Note  Patient identification was confirmed, informed consent was obtained, and patient was prepped using Betadine solution.  Lidocaine jelly was administered per urethral meatus.     Pre-Procedure: - Inspection reveals a normal caliber ureteral meatus.  Procedure: The flexible cystoscope was introduced without difficulty - No urethral strictures/lesions are present.  - Enlarged prostate w/ significant regrowth of right prostatic lobe w/ intravesical protrusion at right lateral lobe and edema on surface w/ bleeding upon manipulation. Left later lobe absent, significant HoLEP defect  - Elevated bladder neck on right only - Bilateral ureteral orifices identified - Bladder mucosa  reveals no ulcers, tumors, or lesions - No bladder stones - No trabeculation  Retroflexion unremarkable.   Post-Procedure: - Patient tolerated the procedure well  Assessment/ Plan:  1. Gross hematuria UA unremarkable  Cysto today revealed significant regrowth of right prostatic lobe w/  intravesical protrusion at right lateral lobe and edema on surface w/ bleeding upon manipulation.   2. BPH w/ urinary obstruction  Relatively asymptomatic, symptoms well controlled Continue finasteride both for BPH as well as prostatic bleeding  Would recommend repeat holep for right-sided regrowth versus TURP if he starts to have worsening urinary symptoms or episodes of bleeding or more frequent or severe, he is agreeable this plan   3. Elevated PSA Personal history of elevated PSA, repeat pending today  4. ED Pt requested sildenafil refill Refill sent to pharmacy    Return in 6 month w/ IPSS/PVR   I, Lucas Mallow, am acting as a scribe for Dr. Hollice Espy,  I have reviewed the above documentation for accuracy and completeness, and I agree with the above.   Hollice Espy, MD

## 2019-12-19 ENCOUNTER — Encounter: Payer: Self-pay | Admitting: Urology

## 2019-12-19 ENCOUNTER — Ambulatory Visit (INDEPENDENT_AMBULATORY_CARE_PROVIDER_SITE_OTHER): Payer: 59 | Admitting: Urology

## 2019-12-19 ENCOUNTER — Other Ambulatory Visit: Payer: Self-pay

## 2019-12-19 VITALS — BP 190/97 | HR 109

## 2019-12-19 DIAGNOSIS — R310 Gross hematuria: Secondary | ICD-10-CM

## 2019-12-19 DIAGNOSIS — R972 Elevated prostate specific antigen [PSA]: Secondary | ICD-10-CM

## 2019-12-19 LAB — MICROSCOPIC EXAMINATION
Bacteria, UA: NONE SEEN
Epithelial Cells (non renal): NONE SEEN /hpf (ref 0–10)

## 2019-12-19 LAB — URINALYSIS, COMPLETE
Bilirubin, UA: NEGATIVE
Glucose, UA: NEGATIVE
Ketones, UA: NEGATIVE
Leukocytes,UA: NEGATIVE
Nitrite, UA: NEGATIVE
Protein,UA: NEGATIVE
RBC, UA: NEGATIVE
Specific Gravity, UA: 1.01 (ref 1.005–1.030)
Urobilinogen, Ur: 0.2 mg/dL (ref 0.2–1.0)
pH, UA: 6 (ref 5.0–7.5)

## 2019-12-19 MED ORDER — SILDENAFIL CITRATE 20 MG PO TABS: 20.0000 mg | ORAL_TABLET | ORAL | 11 refills | Status: DC | PRN

## 2019-12-20 ENCOUNTER — Telehealth: Payer: Self-pay | Admitting: *Deleted

## 2019-12-20 LAB — PSA: Prostate Specific Ag, Serum: 4.8 ng/mL — ABNORMAL HIGH (ref 0.0–4.0)

## 2019-12-20 NOTE — Telephone Encounter (Addendum)
  Patient informed, voiced understanding.   ----- Message from Hollice Espy, MD sent at 12/20/2019 10:40 AM EDT ----- PSA is up slightly from 6 months ago.  I like to check it again in 6 months from now at your follow-up visit.  Your prostate did look a little inflamed at the time of cystoscopy so this may be a possible explanation for the rise.  Becca,  please add PSA to the office visit reason note on his f/u so we don't forget.  Hollice Espy, MD

## 2019-12-21 ENCOUNTER — Other Ambulatory Visit: Payer: Self-pay | Admitting: *Deleted

## 2019-12-21 MED ORDER — SILDENAFIL CITRATE 20 MG PO TABS: 20.0000 mg | ORAL_TABLET | ORAL | 11 refills | Status: DC | PRN

## 2019-12-21 NOTE — Progress Notes (Signed)
Received PA denial for Sildenafil-sent in to [facility] and sent [place] to patient. Voiced understanding.

## 2020-01-12 ENCOUNTER — Other Ambulatory Visit: Payer: Self-pay | Admitting: Family Medicine

## 2020-02-16 ENCOUNTER — Telehealth: Payer: Self-pay | Admitting: Family Medicine

## 2020-02-16 ENCOUNTER — Other Ambulatory Visit (INDEPENDENT_AMBULATORY_CARE_PROVIDER_SITE_OTHER): Payer: 59

## 2020-02-16 ENCOUNTER — Other Ambulatory Visit: Payer: Self-pay

## 2020-02-16 DIAGNOSIS — E785 Hyperlipidemia, unspecified: Secondary | ICD-10-CM

## 2020-02-16 DIAGNOSIS — E119 Type 2 diabetes mellitus without complications: Secondary | ICD-10-CM

## 2020-02-16 DIAGNOSIS — E1169 Type 2 diabetes mellitus with other specified complication: Secondary | ICD-10-CM

## 2020-02-16 DIAGNOSIS — N401 Enlarged prostate with lower urinary tract symptoms: Secondary | ICD-10-CM

## 2020-02-16 DIAGNOSIS — I10 Essential (primary) hypertension: Secondary | ICD-10-CM

## 2020-02-16 LAB — COMPREHENSIVE METABOLIC PANEL
ALT: 29 U/L (ref 0–53)
AST: 19 U/L (ref 0–37)
Albumin: 4.5 g/dL (ref 3.5–5.2)
Alkaline Phosphatase: 86 U/L (ref 39–117)
BUN: 18 mg/dL (ref 6–23)
CO2: 29 mEq/L (ref 19–32)
Calcium: 9.7 mg/dL (ref 8.4–10.5)
Chloride: 104 mEq/L (ref 96–112)
Creatinine, Ser: 0.95 mg/dL (ref 0.40–1.50)
GFR: 79.36 mL/min (ref >60.00)
Glucose, Bld: 139 mg/dL — ABNORMAL HIGH (ref 70–99)
Potassium: 4.1 mEq/L (ref 3.5–5.1)
Sodium: 138 mEq/L (ref 135–145)
Total Bilirubin: 1.2 mg/dL (ref 0.2–1.2)
Total Protein: 6.9 g/dL (ref 6.0–8.3)

## 2020-02-16 LAB — CBC WITH DIFFERENTIAL/PLATELET
Basophils Absolute: 0 10*3/uL (ref 0.0–0.1)
Basophils Relative: 0.8 % (ref 0.0–3.0)
Eosinophils Absolute: 0 10*3/uL (ref 0.0–0.7)
Eosinophils Relative: 0.6 % (ref 0.0–5.0)
HCT: 41.2 % (ref 39.0–52.0)
Hemoglobin: 13.8 g/dL (ref 13.0–17.0)
Lymphocytes Relative: 33.2 % (ref 12.0–46.0)
Lymphs Abs: 1.7 10*3/uL (ref 0.7–4.0)
MCHC: 33.5 g/dL (ref 30.0–36.0)
MCV: 87.1 fl (ref 78.0–100.0)
Monocytes Absolute: 0.5 10*3/uL (ref 0.1–1.0)
Monocytes Relative: 9.9 % (ref 3.0–12.0)
Neutro Abs: 2.9 10*3/uL (ref 1.4–7.7)
Neutrophils Relative %: 55.5 % (ref 43.0–77.0)
Platelets: 172 10*3/uL (ref 150.0–400.0)
RBC: 4.73 Mil/uL (ref 4.22–5.81)
RDW: 13.7 % (ref 11.5–15.5)
WBC: 5.2 10*3/uL (ref 4.0–10.5)

## 2020-02-16 LAB — LIPID PANEL
Cholesterol: 105 mg/dL (ref 0–200)
HDL: 35.9 mg/dL — ABNORMAL LOW (ref >39.00)
LDL Cholesterol: 54 mg/dL (ref 0–99)
NonHDL: 69.38
Total CHOL/HDL Ratio: 3
Triglycerides: 75 mg/dL (ref 0.0–149.0)
VLDL: 15 mg/dL (ref 0.0–40.0)

## 2020-02-16 LAB — TSH: TSH: 3 u[IU]/mL (ref 0.35–4.50)

## 2020-02-16 LAB — HEMOGLOBIN A1C: Hgb A1c MFr Bld: 6.9 % — ABNORMAL HIGH (ref 4.6–6.5)

## 2020-02-16 LAB — PSA, MEDICARE: PSA: 1.96 ng/ml (ref 0.10–4.00)

## 2020-02-16 NOTE — Telephone Encounter (Signed)
-----   Message from Ellamae Sia sent at 01/31/2020  9:40 AM EDT ----- Regarding: Lab orders for Friday, 8.6.21 Patient is scheduled for CPX labs, please order future labs, Thanks , Karna Christmas

## 2020-02-23 ENCOUNTER — Other Ambulatory Visit: Payer: Self-pay

## 2020-02-23 ENCOUNTER — Ambulatory Visit (INDEPENDENT_AMBULATORY_CARE_PROVIDER_SITE_OTHER): Payer: 59 | Admitting: Family Medicine

## 2020-02-23 ENCOUNTER — Encounter: Payer: Self-pay | Admitting: Family Medicine

## 2020-02-23 VITALS — BP 135/81 | HR 86 | Temp 95.6°F | Ht 68.0 in | Wt 233.3 lb

## 2020-02-23 DIAGNOSIS — Z23 Encounter for immunization: Secondary | ICD-10-CM | POA: Diagnosis not present

## 2020-02-23 DIAGNOSIS — E1169 Type 2 diabetes mellitus with other specified complication: Secondary | ICD-10-CM

## 2020-02-23 DIAGNOSIS — Z0000 Encounter for general adult medical examination without abnormal findings: Secondary | ICD-10-CM | POA: Diagnosis not present

## 2020-02-23 DIAGNOSIS — E785 Hyperlipidemia, unspecified: Secondary | ICD-10-CM

## 2020-02-23 DIAGNOSIS — I10 Essential (primary) hypertension: Secondary | ICD-10-CM | POA: Diagnosis not present

## 2020-02-23 DIAGNOSIS — N401 Enlarged prostate with lower urinary tract symptoms: Secondary | ICD-10-CM

## 2020-02-23 DIAGNOSIS — E119 Type 2 diabetes mellitus without complications: Secondary | ICD-10-CM | POA: Diagnosis not present

## 2020-02-23 DIAGNOSIS — E786 Lipoprotein deficiency: Secondary | ICD-10-CM

## 2020-02-23 MED ORDER — LOSARTAN POTASSIUM-HCTZ 100-25 MG PO TABS: 1.0000 | ORAL_TABLET | Freq: Every day | ORAL | 3 refills | Status: DC

## 2020-02-23 MED ORDER — ATORVASTATIN CALCIUM 10 MG PO TABS: 10.0000 mg | ORAL_TABLET | Freq: Every day | ORAL | 3 refills | Status: DC

## 2020-02-23 MED ORDER — METFORMIN HCL 500 MG PO TABS: ORAL_TABLET | ORAL | 3 refills | Status: DC

## 2020-02-23 NOTE — Patient Instructions (Addendum)
Try to fit in some extra exercise daily  Think about some strength training   For diabetes Try to get most of your carbohydrates from produce (with the exception of white potatoes)  Eat less bread/pasta/rice/snack foods/cereals/sweets and other items from the middle of the grocery store (processed carbs)  prevnar vaccine today   Check blood pressure at home once in a while you are relaxed   When you check out stop - and we will have you sign a release to send [place] your eye exam

## 2020-02-23 NOTE — Assessment & Plan Note (Signed)
Disc goals for lipids and reasons to control them Rev last labs with pt Rev low sat fat diet in detail  LDL is well controlled at 54 with atorvastatin  Low HDL- consistent  Enc omega 3 intake and exercise

## 2020-02-23 NOTE — Progress Notes (Signed)
Subjective:    Patient ID: Daniel Lee, male    DOB: 03/11/1956, 64 y.o.   MRN: [id number]  This visit occurred during the SARS-CoV-2 public health emergency.  Safety protocols were in place, including screening questions prior to the visit, additional usage of staff PPE, and extensive cleaning of exam room while observing appropriate contact time as indicated for disinfecting solutions.    HPI  Here for health maintenance exam and to review chronic medical problems    Wt Readings from Last 3 Encounters:  02/23/20 233 lb 5 oz (105.8 kg)  08/25/19 235 lb 4 oz (106.7 kg)  06/20/19 232 lb (105.2 kg)   35.48 kg/m   About to go on vacation  Feels pretty good overall   He wants to work on more exercise  Diet is fair-is mindful   Some R knee pain when he kneels occ   covid immunized pfizer in [place] Interested in [place] - will do today  zostavax 11/16 Will get a flu shot in the fall  Tdap 7/18   Colonoscopy 11/13  Prostate health- h/o BPH with surgery  Had visit in June with urology  (one side of prostate was larger)  He had some urinary blood- and then cystoscopy  Lab Results  Component Value Date   PSA1 4.8 (H) 12/19/2019   PSA1 4.2 (H) 06/20/2019   PSA1 6.3 (H) 08/12/2018   PSA 1.96 02/16/2020   PSA 12.06 (H) 01/22/2017   PSA 10.86 (H) 06/28/2015  he still takes proscar and had urol f/u in dec  Symptoms are not too bad   father had prostate cancer   HTN bp is stable today  No cp or palpitations or headaches or edema  No side effects to medicines  BP Readings from Last 3 Encounters:  02/23/20 (!) 148/90  12/19/19 (!) 190/97  08/25/19 138/86    Was high when he went to urology-unusual for him /he felt anxious  Re check is better BP: 135/81    Pulse Readings from Last 3 Encounters:  02/23/20 86  12/19/19 (!) 109  08/25/19 89    DM2  Lab Results  Component Value Date   HGBA1C 6.9 (H) 02/16/2020   This is up from last tim 6.6 (prior to that  6.8) He has perhaps eaten more  Sweets occasionally (ate cake for a birthday) Taking ace and statin  Eye exam - had visit in July (no retinopathy)   Hyperlipidemia  Lab Results  Component Value Date   CHOL 105 02/16/2020   CHOL 95 08/17/2019   CHOL 108 02/10/2019   Lab Results  Component Value Date   HDL 35.90 (L) 02/16/2020   HDL 34.30 (L) 08/17/2019   HDL 37.90 (L) 02/10/2019   Lab Results  Component Value Date   LDLCALC 54 02/16/2020   LDLCALC 43 08/17/2019   LDLCALC 51 02/10/2019   Lab Results  Component Value Date   TRIG 75.0 02/16/2020   TRIG 89.0 08/17/2019   TRIG 98.0 02/10/2019   Lab Results  Component Value Date   CHOLHDL 3 02/16/2020   CHOLHDL 3 08/17/2019   CHOLHDL 3 02/10/2019   Lab Results  Component Value Date   LDLDIRECT 99.9 06/23/2011  atorvastatin and diet  Overall stable LDL remains under 70  HDL is always low   Lab Results  Component Value Date   CREATININE 0.95 02/16/2020   BUN 18 02/16/2020   NA 138 02/16/2020   K 4.1 02/16/2020   CL 104  02/16/2020   CO2 29 02/16/2020   Lab Results  Component Value Date   ALT 29 02/16/2020   AST 19 02/16/2020   ALKPHOS 86 02/16/2020   BILITOT 1.2 02/16/2020   Lab Results  Component Value Date   WBC 5.2 02/16/2020   HGB 13.8 02/16/2020   HCT 41.2 02/16/2020   MCV 87.1 02/16/2020   PLT 172.0 02/16/2020   Lab Results  Component Value Date   TSH 3.00 02/16/2020     Mood is good/neg dep screen  Patient Active Problem List   Diagnosis Date Noted   Routine general medical examination at a health care facility 06/22/2011   ESSENTIAL HYPERTENSION, BENIGN 04/25/2009   Hyperlipidemia associated with type 2 diabetes mellitus ([place]) 07/19/2008   Diabetes type 2, controlled ([place]) 04/16/2008   Low HDL (under 40) 03/18/2007   ERECTILE DYSFUNCTION 10/07/2006   PROSTATE SPECIFIC ANTIGEN, ELEVATED 10/07/2006   BPH (benign prostatic hyperplasia) 10/07/2006   Past Medical History:    Diagnosis Date   BPH (benign prostatic hypertrophy)    Diabetes mellitus without complication ([place])    ED (erectile dysfunction)    Elevated alanine aminotransferase (ALT) level    suspect fatty liver   Hyperglycemia    Hypertension    Past Surgical History:  Procedure Laterality Date   abd ultrasound  11/2005   negative   COLONOSCOPY     HOLEP-LASER ENUCLEATION OF THE PROSTATE WITH MORCELLATION N/A 12/28/2017   Procedure: HOLEP-LASER ENUCLEATION OF THE PROSTATE WITH MORCELLATION;  Surgeon: Hollice Espy, MD;  Location: [facility] ORS;  Service: Urology;  Laterality: N/A;   PROSTATE SURGERY  2003   prostate biopsy negative // urologist   Social History   Tobacco Use   Smoking status: Former Smoker    Packs/day: 0.25    Types: Cigarettes    Quit date: 07/13/2001    Years since quitting: 18.6   Smokeless tobacco: Former Systems developer    Types: Chew    Quit date: 07/13/2001   Tobacco comment: smoked a cigarette every couple of days  Vaping Use   Vaping Use: Never assessed  Substance Use Topics   Alcohol use: No    Alcohol/week: 0.0 standard drinks   Drug use: No   Family History  Problem Relation Age of Onset   Cancer Father        prostate cancer    Diabetes Father    Stroke Father    Prostate cancer Father    Hypertension Mother    Hyperlipidemia Mother    Bladder Cancer Mother    Heart disease Brother    Diabetes Brother    Kidney disease Brother    Cancer Paternal Uncle        prostate cancer   Kidney cancer Neg Hx    No Known Allergies Current Outpatient Medications on File Prior to Visit  Medication Sig Dispense Refill   ACCU-CHEK AVIVA PLUS test strip USE TO CHECK BLOOD SUGAR ONCE DAILY AND AS NEEDED (DX. E11.9) 100 strip 2   amLODipine (NORVASC) 5 MG tablet TAKE 1 TABLET BY MOUTH EVERY DAY 90 tablet 0   aspirin 81 MG EC tablet Take 81 mg by mouth daily.      finasteride (PROSCAR) 5 MG tablet Take 1 tablet (5 mg total) by mouth daily.  90 tablet 3   Lancets (ACCU-CHEK MULTICLIX) lancets Use as instructed 100 each 12   sildenafil (REVATIO) 20 MG tablet Take 1 tablet (20 mg total) by mouth as needed. Take 1-5 tabs  as needed prior to intercourse 30 tablet 11   No current facility-administered medications on file prior to visit.    Review of Systems  Constitutional: Negative for activity change, appetite change, fatigue, fever and unexpected weight change.  HENT: Negative for congestion, rhinorrhea, sore throat and trouble swallowing.   Eyes: Negative for pain, redness, itching and visual disturbance.  Respiratory: Negative for cough, chest tightness, shortness of breath and wheezing.   Cardiovascular: Negative for chest pain and palpitations.  Gastrointestinal: Negative for abdominal pain, blood in stool, constipation, diarrhea and nausea.  Endocrine: Negative for cold intolerance, heat intolerance, polydipsia and polyuria.  Genitourinary: Negative for difficulty urinating, dysuria, frequency and urgency.  Musculoskeletal: Negative for arthralgias, joint swelling and myalgias.       Occ R knee pain with kneeling   Skin: Negative for pallor and rash.  Neurological: Negative for dizziness, tremors, weakness, numbness and headaches.  Hematological: Negative for adenopathy. Does not bruise/bleed easily.  Psychiatric/Behavioral: Negative for decreased concentration and dysphoric mood. The patient is not nervous/anxious.        Objective:   Physical Exam Constitutional:      General: He is not in acute distress.    Appearance: Normal appearance. He is well-developed. He is obese. He is not ill-appearing or diaphoretic.  HENT:     Head: Normocephalic and atraumatic.     Right Ear: Tympanic membrane, ear canal and external ear normal.     Left Ear: Tympanic membrane, ear canal and external ear normal.     Nose: Nose normal. No congestion.     Mouth/Throat:     Mouth: Mucous membranes are moist.     Pharynx: Oropharynx is  clear. No posterior oropharyngeal erythema.  Eyes:     General: No scleral icterus.       Right eye: No discharge.        Left eye: No discharge.     Conjunctiva/sclera: Conjunctivae normal.     Pupils: Pupils are equal, round, and reactive to light.  Neck:     Thyroid: No thyromegaly.     Vascular: No carotid bruit or JVD.  Cardiovascular:     Rate and Rhythm: Normal rate and regular rhythm.     Pulses: Normal pulses.     Heart sounds: Normal heart sounds. No gallop.   Pulmonary:     Effort: Pulmonary effort is normal. No respiratory distress.     Breath sounds: Normal breath sounds. No wheezing or rales.     Comments: Good air exch Chest:     Chest wall: No tenderness.  Abdominal:     General: Bowel sounds are normal. There is no distension or abdominal bruit.     Palpations: Abdomen is soft. There is no mass.     Tenderness: There is no abdominal tenderness.     Hernia: No hernia is present.  Musculoskeletal:        General: No tenderness.     Cervical back: Normal range of motion and neck supple. No rigidity. No muscular tenderness.     Right lower leg: No edema.     Left lower leg: No edema.  Lymphadenopathy:     Cervical: No cervical adenopathy.  Skin:    General: Skin is warm and dry.     Coloration: Skin is not pale.     Findings: No erythema or rash.     Comments: Solar lentigines diffusely Tanned   Neurological:     Mental Status: He is alert.  Cranial Nerves: No cranial nerve deficit.     Motor: No abnormal muscle tone.     Coordination: Coordination normal.     Gait: Gait normal.     Deep Tendon Reflexes: Reflexes are normal and symmetric. Reflexes normal.  Psychiatric:        Mood and Affect: Mood normal.        Cognition and Memory: Cognition and memory normal.           Assessment & Plan:   Problem List Items Addressed This Visit      Cardiovascular and Mediastinum   ESSENTIAL HYPERTENSION, BENIGN    bp in fair control at this time  BP  Readings from Last 1 Encounters:  02/23/20 135/81   No changes needed Most recent labs reviewed  Disc lifstyle change with low sodium diet and exercise        Relevant Medications   losartan-hydrochlorothiazide (HYZAAR) 100-25 MG tablet   atorvastatin (LIPITOR) 10 MG tablet     Endocrine   Diabetes type 2, controlled ([place])    Lab Results  Component Value Date   HGBA1C 6.9 (H) 02/16/2020   This is up slightly -enc better effort for low glycemic diet and more exercise  On statin and arb  Sending for eye exam report -utd Foot exam is normal  F/u 6 mo      Relevant Medications   metFORMIN (GLUCOPHAGE) 500 MG tablet   losartan-hydrochlorothiazide (HYZAAR) 100-25 MG tablet   atorvastatin (LIPITOR) 10 MG tablet   Hyperlipidemia associated with type 2 diabetes mellitus ([facility])    Disc goals for lipids and reasons to control them Rev last labs with pt Rev low sat fat diet in detail  LDL is well controlled at 54 with atorvastatin  Low HDL- consistent  Enc omega 3 intake and exercise       Relevant Medications   metFORMIN (GLUCOPHAGE) 500 MG tablet   losartan-hydrochlorothiazide (HYZAAR) 100-25 MG tablet   atorvastatin (LIPITOR) 10 MG tablet     Genitourinary   BPH (benign prostatic hyperplasia)    Continues urology f/u and treatment  Surgery in the past Lab Results  Component Value Date   PSA1 4.8 (H) 12/19/2019   PSA1 4.2 (H) 06/20/2019   PSA1 6.3 (H) 08/12/2018   PSA 1.96 02/16/2020   PSA 12.06 (H) 01/22/2017   PSA 10.86 (H) 06/28/2015            Other   Low HDL (under 40)   Routine general medical examination at a health care facility - Primary    Reviewed health habits including diet and exercise and skin cancer prevention Reviewed appropriate screening tests for age  Also reviewed health mt list, fam hx and immunization status , as well as social and family history   See HPI Labs reviewed  covid immunized  prevnar vaccine today  Will get flu shot in the  fall  Disc shingrix vaccine ni future  Continue urology f/u for prostate  Enc wt loss and fitness for better health        Other Visit Diagnoses    Need for vaccination with 13-polyvalent pneumococcal conjugate vaccine       Relevant Orders   Pneumococcal conjugate vaccine 13-valent IM (Completed)

## 2020-02-23 NOTE — Assessment & Plan Note (Signed)
Continues urology f/u and treatment  Surgery in the past Lab Results  Component Value Date   PSA1 4.8 (H) 12/19/2019   PSA1 4.2 (H) 06/20/2019   PSA1 6.3 (H) 08/12/2018   PSA 1.96 02/16/2020   PSA 12.06 (H) 01/22/2017   PSA 10.86 (H) 06/28/2015

## 2020-02-23 NOTE — Assessment & Plan Note (Signed)
bp in fair control at this time  BP Readings from Last 1 Encounters:  02/23/20 135/81   No changes needed Most recent labs reviewed  Disc lifstyle change with low sodium diet and exercise

## 2020-02-23 NOTE — Assessment & Plan Note (Signed)
Reviewed health habits including diet and exercise and skin cancer prevention Reviewed appropriate screening tests for age  Also reviewed health mt list, fam hx and immunization status , as well as social and family history   See HPI Labs reviewed  covid immunized  prevnar vaccine today  Will get flu shot in the fall  Disc shingrix vaccine ni future  Continue urology f/u for prostate  Enc wt loss and fitness for better health

## 2020-02-23 NOTE — Assessment & Plan Note (Signed)
Lab Results  Component Value Date   HGBA1C 6.9 (H) 02/16/2020   This is up slightly -enc better effort for low glycemic diet and more exercise  On statin and arb  Sending for eye exam report -utd Foot exam is normal  F/u 6 mo

## 2020-02-27 IMAGING — US US SCROTUM W/ DOPPLER COMPLETE
1 series · 13 of 25 positions shown · non-contrast
Comparison: None.

CLINICAL DATA: Initial evaluation for acute right testicular pain
and swelling for 3 days.

EXAM:
SCROTAL ULTRASOUND
DOPPLER ULTRASOUND OF THE TESTICLES
TECHNIQUE: Complete ultrasound examination of the testicles, epididymis, and
other scrotal structures was performed. Color and spectral Doppler
ultrasound were also utilized to evaluate blood flow to the
testicles.

[Series 1: us scrotum w/ doppler complete · 78 acquisitions, 13 frames shown]
[im 1/78]
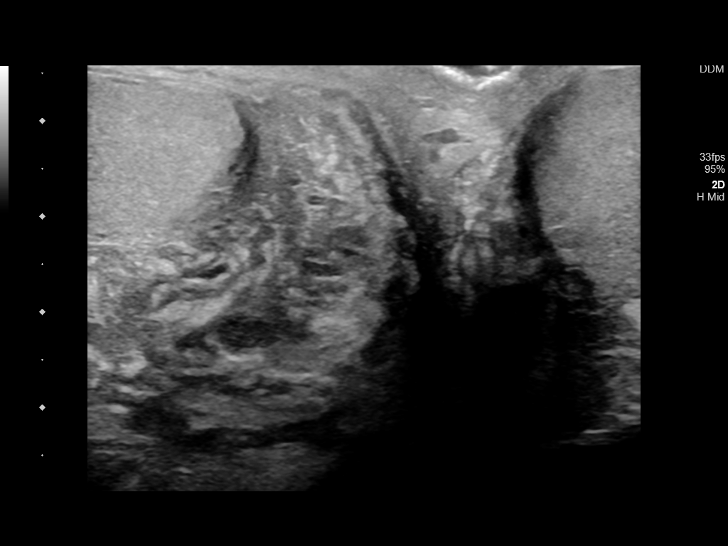
[im 7/78]
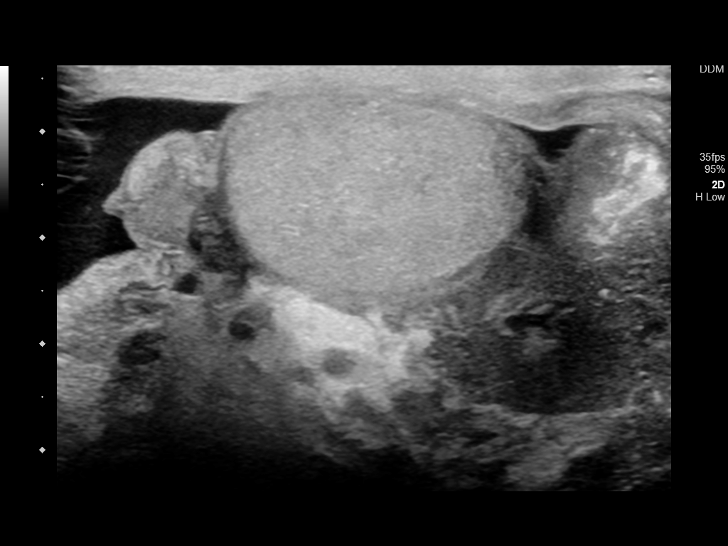
[im 13/78]
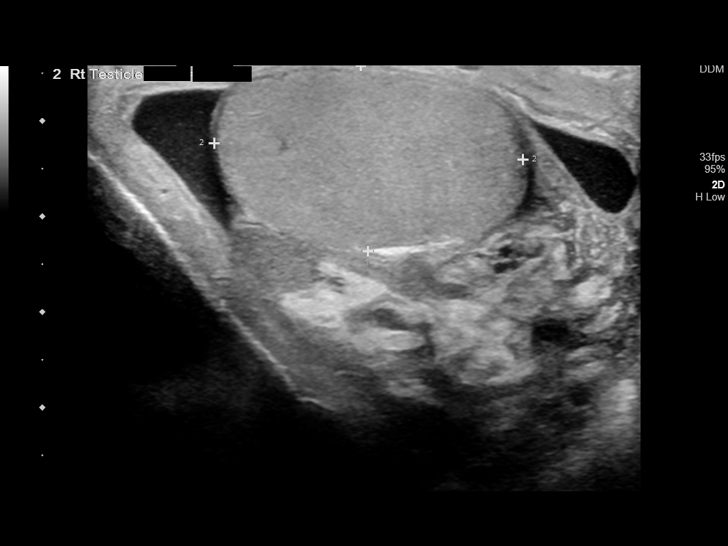
[im 20/78]
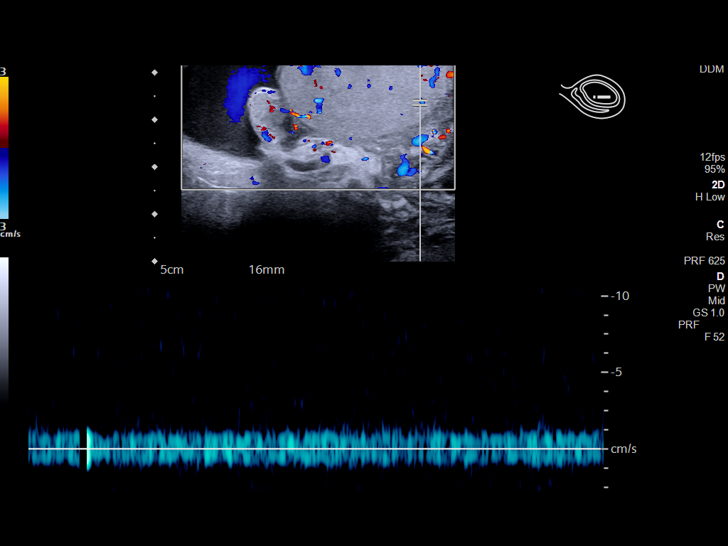
[im 26/78]
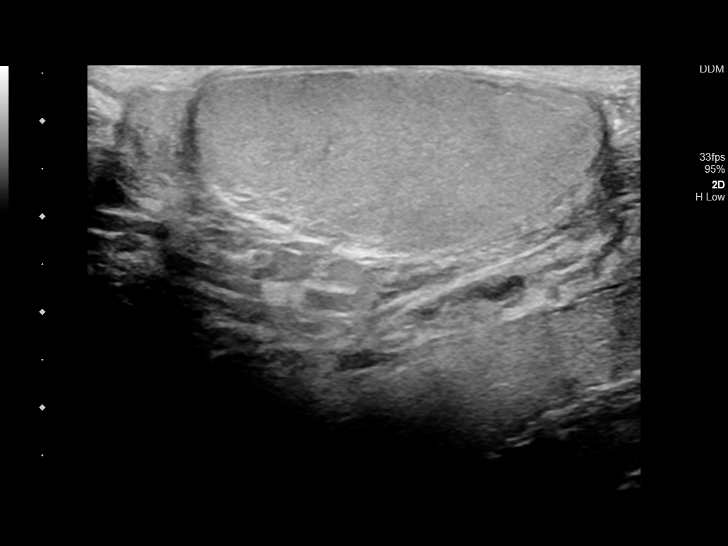
[im 33/78]
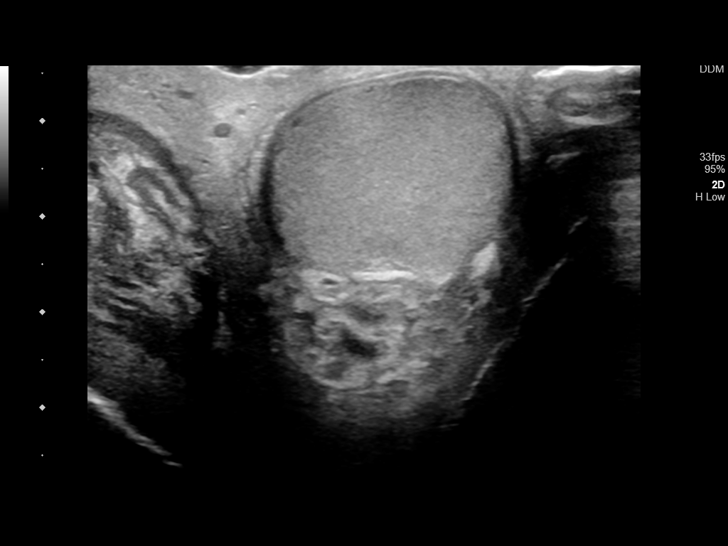
[im 39/78]
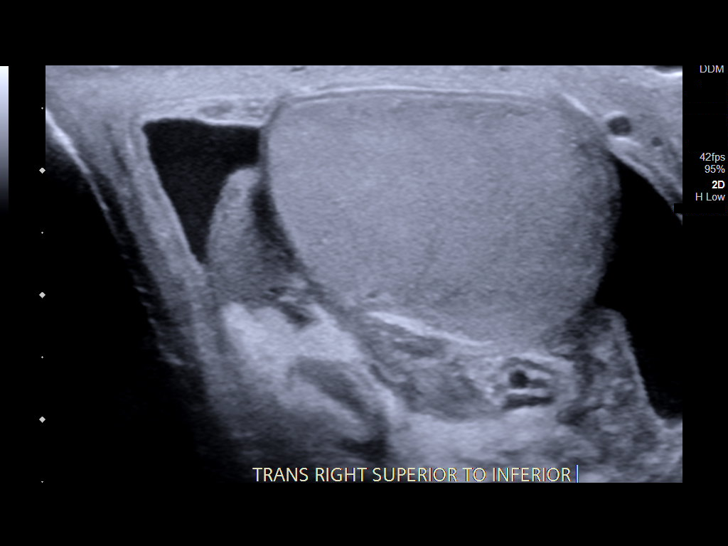
[im 45/78]
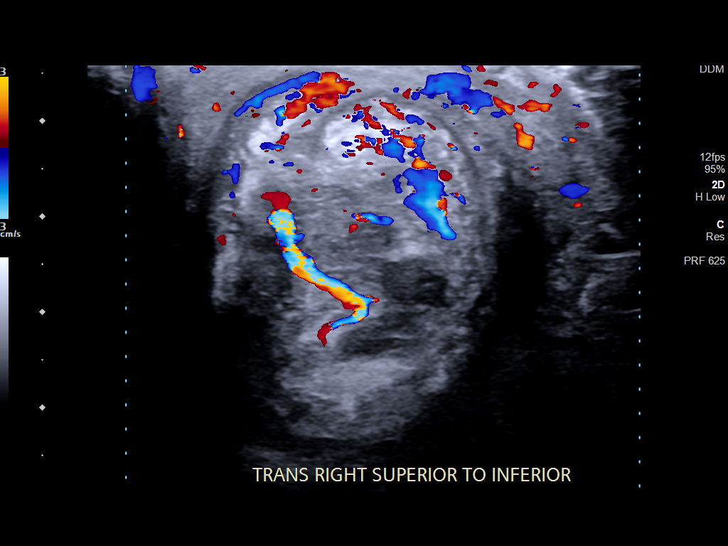
[im 52/78]
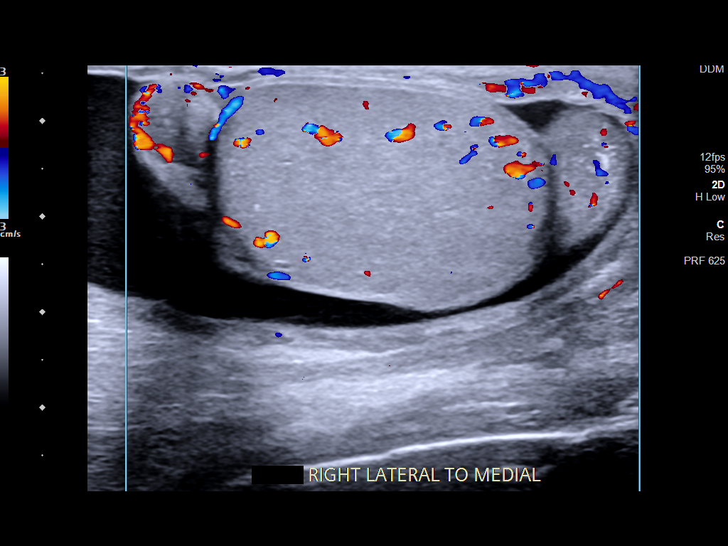
[im 58/78]
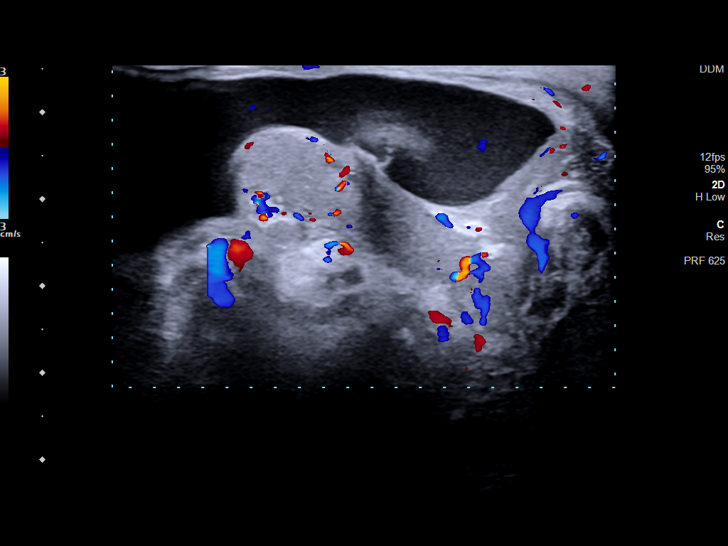
[im 65/78]
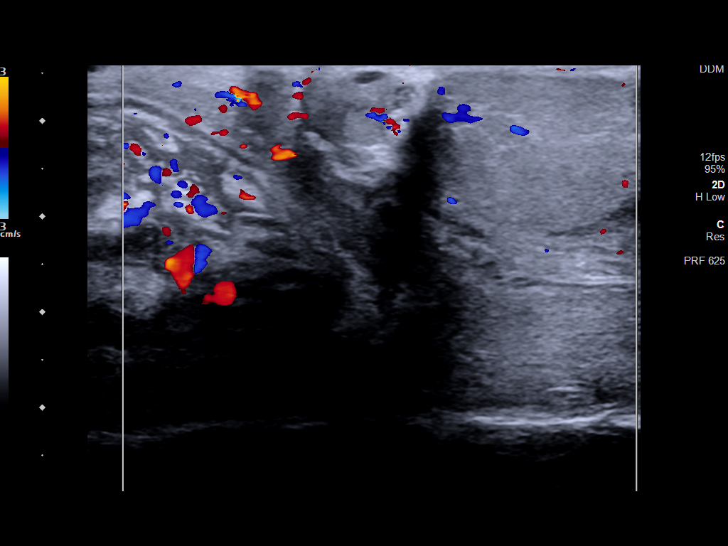
[im 71/78]
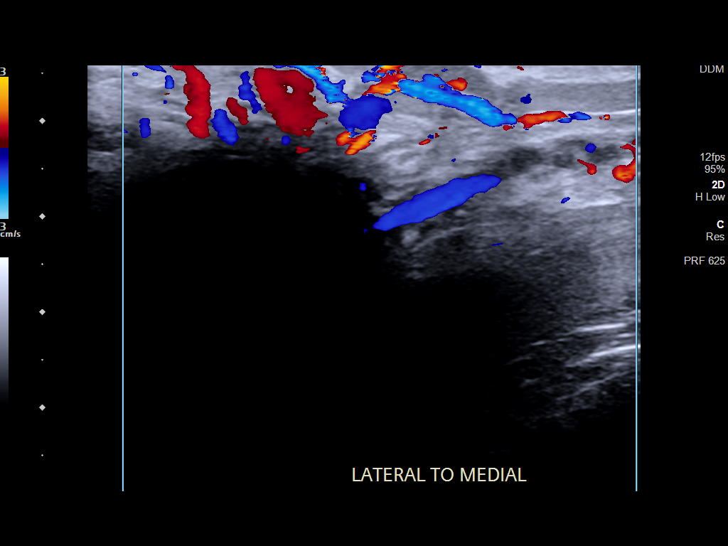
[im 78/78]
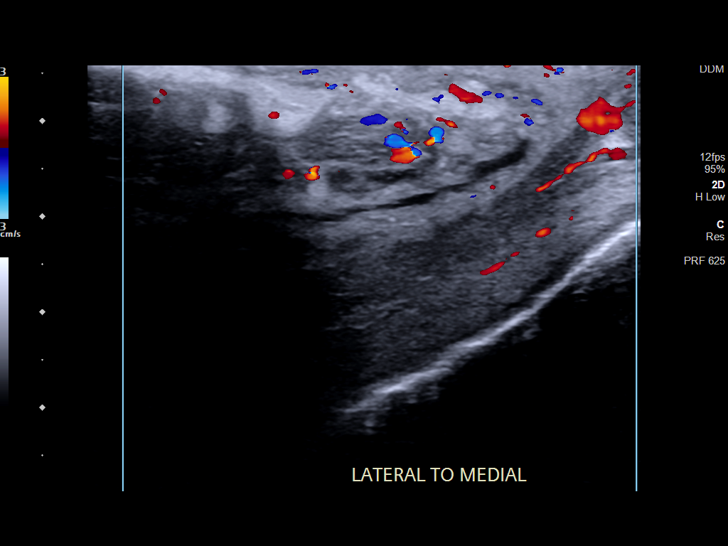

[13 of 25 positions shown; findings below may reference images not displayed]

FINDINGS: Right testicle

Measurements: 3.6 x 1.9 x 3.2 cm. No mass or microlithiasis
visualized.

Left testicle

Measurements: 3.9 x 1.9 x 2.4 cm. No mass or microlithiasis
visualized.

Right epididymis: Right epididymis is enlarged and heterogeneous in
appearance with increased vascularity, suggesting acute
epididymitis. Superimposed 6 mm simple cyst noted at the right
epididymal head, of doubtful significance.

Left epididymis: Increased vascularity within the left epididymis as
well, also suspicious for acute epididymitis, but to lesser degree
as compared to the right.

Hydrocele:  Small right-sided hydrocele, likely reactive.

Varicocele:  None visualized.

Pulsed Doppler interrogation of both testes demonstrates normal low
resistance arterial and venous waveforms bilaterally.

Overlying scrotal wall thickening/edema noted.
IMPRESSION: 1. Increased vascularity within the bilateral epididymi, right worse
than left, suggesting acute epididymitis.
2. Small reactive right-sided hydrocele.
3. No evidence for testicular torsion.

## 2020-04-08 ENCOUNTER — Other Ambulatory Visit: Payer: Self-pay | Admitting: Family Medicine

## 2020-05-10 ENCOUNTER — Other Ambulatory Visit: Payer: Self-pay | Admitting: Family Medicine

## 2020-05-24 ENCOUNTER — Ambulatory Visit (INDEPENDENT_AMBULATORY_CARE_PROVIDER_SITE_OTHER): Payer: 59 | Admitting: Family Medicine

## 2020-05-24 ENCOUNTER — Other Ambulatory Visit: Payer: Self-pay

## 2020-05-24 ENCOUNTER — Encounter: Payer: Self-pay | Admitting: Family Medicine

## 2020-05-24 DIAGNOSIS — M67471 Ganglion, right ankle and foot: Secondary | ICD-10-CM | POA: Diagnosis not present

## 2020-05-24 NOTE — Patient Instructions (Addendum)
The office will call to set up a podiatry appointment   Avoid tight fitting shoes that rub on it  Alert [place] if red or swollen or painful  Avoid trauma to that area   Keep [place] posted

## 2020-05-24 NOTE — Progress Notes (Signed)
Subjective:    Patient ID: Daniel Lee, male    DOB: 1956-01-23, 64 y.o.   MRN: [id number]  This visit occurred during the SARS-CoV-2 public health emergency.  Safety protocols were in place, including screening questions prior to the visit, additional usage of staff PPE, and extensive cleaning of exam room while observing appropriate contact time as indicated for disinfecting solutions.    HPI Pt presents with a foot problem   Wt Readings from Last 3 Encounters:  05/24/20 234 lb 9 oz (106.4 kg)  02/23/20 233 lb 5 oz (105.8 kg)  08/25/19 235 lb 4 oz (106.7 kg)   35.67 kg/m  L foot  Found a knot at base of the great toe  May have grown a bit since august  Not painful  Can tell it is there  No problems with shoes  No numbness  No redness or wound   Nails are ok   He is a fairly controlled diabetic   Patient Active Problem List   Diagnosis Date Noted  . Ganglion cyst of right foot 05/24/2020  . Routine general medical examination at a health care facility 06/22/2011  . ESSENTIAL HYPERTENSION, BENIGN 04/25/2009  . Hyperlipidemia associated with type 2 diabetes mellitus ([place]) 07/19/2008  . Diabetes type 2, controlled ([place]) 04/16/2008  . Low HDL (under 40) 03/18/2007  . ERECTILE DYSFUNCTION 10/07/2006  . PROSTATE SPECIFIC ANTIGEN, ELEVATED 10/07/2006  . BPH (benign prostatic hyperplasia) 10/07/2006   Past Medical History:  Diagnosis Date  . BPH (benign prostatic hypertrophy)   . Diabetes mellitus without complication ([place])   . ED (erectile dysfunction)   . Elevated alanine aminotransferase (ALT) level    suspect fatty liver  . Hyperglycemia   . Hypertension    Past Surgical History:  Procedure Laterality Date  . abd ultrasound  11/2005   negative  . COLONOSCOPY    . HOLEP-LASER ENUCLEATION OF THE PROSTATE WITH MORCELLATION N/A 12/28/2017   Procedure: HOLEP-LASER ENUCLEATION OF THE PROSTATE WITH MORCELLATION;  Surgeon: Hollice Espy, MD;  Location: [facility]  ORS;  Service: Urology;  Laterality: N/A;  . PROSTATE SURGERY  2003   prostate biopsy negative // urologist   Social History   Tobacco Use  . Smoking status: Former Smoker    Packs/day: 0.25    Types: Cigarettes    Quit date: 07/13/2001    Years since quitting: 18.8  . Smokeless tobacco: Former Systems developer    Types: Chew    Quit date: 07/13/2001  . Tobacco comment: smoked a cigarette every couple of days  Vaping Use  . Vaping Use: Never assessed  Substance Use Topics  . Alcohol use: No    Alcohol/week: 0.0 standard drinks  . Drug use: No   Family History  Problem Relation Age of Onset  . Cancer Father        prostate cancer   . Diabetes Father   . Stroke Father   . Prostate cancer Father   . Hypertension Mother   . Hyperlipidemia Mother   . Bladder Cancer Mother   . Heart disease Brother   . Diabetes Brother   . Kidney disease Brother   . Cancer Paternal Uncle        prostate cancer  . Kidney cancer Neg Hx    No Known Allergies Current Outpatient Medications on File Prior to Visit  Medication Sig Dispense Refill  . ACCU-CHEK AVIVA PLUS test strip USE TO CHECK BLOOD SUGAR ONCE DAILY AND AS NEEDED (DX. E11.9) 100  strip 2  . amLODipine (NORVASC) 5 MG tablet TAKE 1 TABLET BY MOUTH EVERY DAY 90 tablet 2  . aspirin 81 MG EC tablet Take 81 mg by mouth daily.     Marland Kitchen atorvastatin (LIPITOR) 10 MG tablet Take 1 tablet (10 mg total) by mouth daily. 90 tablet 3  . finasteride (PROSCAR) 5 MG tablet Take 1 tablet (5 mg total) by mouth daily. 90 tablet 3  . Lancets (ACCU-CHEK MULTICLIX) lancets Use as instructed 100 each 12  . losartan-hydrochlorothiazide (HYZAAR) 100-25 MG tablet Take 1 tablet by mouth daily. 90 tablet 3  . metFORMIN (GLUCOPHAGE) 500 MG tablet TAKE 1 TABLET BY MOUTH TWICE A DAY WITH A MEAL 180 tablet 3  . sildenafil (REVATIO) 20 MG tablet Take 1 tablet (20 mg total) by mouth as needed. Take 1-5 tabs as needed prior to intercourse 30 tablet 11   No current  facility-administered medications on file prior to visit.    Review of Systems  Constitutional: Negative for activity change, appetite change, fatigue, fever and unexpected weight change.  HENT: Negative for congestion, rhinorrhea, sore throat and trouble swallowing.   Eyes: Negative for pain, redness, itching and visual disturbance.  Respiratory: Negative for cough, chest tightness, shortness of breath and wheezing.   Cardiovascular: Negative for chest pain and palpitations.  Gastrointestinal: Negative for abdominal pain, blood in stool, constipation, diarrhea and nausea.  Endocrine: Negative for cold intolerance, heat intolerance, polydipsia and polyuria.  Genitourinary: Negative for difficulty urinating, dysuria, frequency and urgency.  Musculoskeletal: Negative for arthralgias, joint swelling and myalgias.  Skin: Negative for pallor, rash and wound.       Cyst on L foot  Not red or painful  No wound or nail problems   Neurological: Negative for dizziness, tremors, weakness, numbness and headaches.  Hematological: Negative for adenopathy. Does not bruise/bleed easily.  Psychiatric/Behavioral: Negative for decreased concentration and dysphoric mood. The patient is not nervous/anxious.        Objective:   Physical Exam Constitutional:      Appearance: Normal appearance. He is obese.  Eyes:     Conjunctiva/sclera: Conjunctivae normal.     Pupils: Pupils are equal, round, and reactive to light.  Cardiovascular:     Rate and Rhythm: Normal rate and regular rhythm.  Musculoskeletal:     Right lower leg: No edema.     Left lower leg: No edema.     Comments: 1 cm cyst -rubbery and slt mobile at base of dorsal L foot  Non tender No erythema or skin blanching  No wound  Nails appear healthy on this foot   Nl rom foot/ankle and toes   Skin:    General: Skin is warm and dry.     Coloration: Skin is not jaundiced or pale.     Findings: No erythema or rash.  Neurological:      Mental Status: He is alert.     Sensory: No sensory deficit.  Psychiatric:        Mood and Affect: Mood normal.           Assessment & Plan:   Problem List Items Addressed This Visit      Other   Ganglion cyst of right foot    With location- may be ganglion or other  At base of L great toe dorsally  Getting bigger  Not bothersome but noticeable (pt is also diabetic) Ref made to podiatry for further eval /tx  Disc s/s of infection to watch for  inst to avoid trauma or tight fitting shoes      Relevant Orders   Ambulatory referral to Podiatry

## 2020-05-24 NOTE — Assessment & Plan Note (Signed)
With location- may be ganglion or other  At base of L great toe dorsally  Getting bigger  Not bothersome but noticeable (pt is also diabetic) Ref made to podiatry for further eval /tx  Disc s/s of infection to watch for  inst to avoid trauma or tight fitting shoes

## 2020-05-31 ENCOUNTER — Other Ambulatory Visit: Payer: Self-pay

## 2020-05-31 ENCOUNTER — Ambulatory Visit (INDEPENDENT_AMBULATORY_CARE_PROVIDER_SITE_OTHER): Payer: 59

## 2020-05-31 ENCOUNTER — Ambulatory Visit (INDEPENDENT_AMBULATORY_CARE_PROVIDER_SITE_OTHER): Payer: 59 | Admitting: Podiatry

## 2020-05-31 VITALS — BP 159/93 | HR 83 | Temp 98.1°F | Resp 16

## 2020-05-31 DIAGNOSIS — M67472 Ganglion, left ankle and foot: Secondary | ICD-10-CM | POA: Diagnosis not present

## 2020-05-31 NOTE — Progress Notes (Signed)
   HPI: 64 y.o. male presenting today for evaluation of a lump that developed to the dorsal aspect of the left great toe approximately 3 months ago.  Patient states that it started out about the size of a pea and now it is the size of a grape.  He went to his PCP and was referred here.  He states that it is minimally painful.  Sudden onset.  He denies any injury or trauma to the area.  Past Medical History:  Diagnosis Date  . BPH (benign prostatic hypertrophy)   . Diabetes mellitus without complication ([place])   . ED (erectile dysfunction)   . Elevated alanine aminotransferase (ALT) level    suspect fatty liver  . Hyperglycemia   . Hypertension      Physical Exam: General: The patient is alert and oriented x3 in no acute distress.  Dermatology: Skin is warm, dry and supple bilateral lower extremities. Negative for open lesions or macerations.  Vascular: Palpable pedal pulses bilaterally. No edema or erythema noted. Capillary refill within normal limits.  Neurological: Epicritic and protective threshold grossly intact bilaterally.   Musculoskeletal Exam: Range of motion within normal limits to all pedal and ankle joints bilateral. Muscle strength 5/5 in all groups bilateral.  Large fluctuant ganglion cyst noted to the dorsal aspect of the left hallux about the size of a grape.  No discoloration noted.  Radiographic Exam:  Normal osseous mineralization. Joint spaces preserved. No fracture/dislocation/boney destruction.    Assessment: 1.  Ganglion cyst left hallux   Plan of Care:  1. Patient evaluated. X-Rays reviewed.  2.  Pressure was applied to the ganglion cyst and immediately the cyst was ruptured and resorbed into the soft tissues of the foot. 3.  Recommend compression daily 4.  Recommend good shoes that do not irritate or aggravate the area  5.  Return to clinic as needed      Daniel Lee, DPM [facility]  Dr. Edrick Lee, DPM    [address], [place] [address]                Office [phone]  Fax [phone]

## 2020-06-13 ENCOUNTER — Other Ambulatory Visit: Payer: Self-pay | Admitting: *Deleted

## 2020-06-13 DIAGNOSIS — N401 Enlarged prostate with lower urinary tract symptoms: Secondary | ICD-10-CM

## 2020-06-13 DIAGNOSIS — N138 Other obstructive and reflux uropathy: Secondary | ICD-10-CM

## 2020-06-14 ENCOUNTER — Other Ambulatory Visit: Payer: Self-pay

## 2020-06-14 ENCOUNTER — Other Ambulatory Visit: Payer: 59

## 2020-06-14 DIAGNOSIS — N401 Enlarged prostate with lower urinary tract symptoms: Secondary | ICD-10-CM

## 2020-06-14 DIAGNOSIS — N138 Other obstructive and reflux uropathy: Secondary | ICD-10-CM

## 2020-06-15 LAB — PSA: Prostate Specific Ag, Serum: 2.5 ng/mL (ref 0.0–4.0)

## 2020-06-17 ENCOUNTER — Telehealth: Payer: Self-pay

## 2020-06-17 NOTE — Telephone Encounter (Signed)
-----   Message from Hollice Espy, MD sent at 06/17/2020  9:25 AM EST ----- PSA is excellent, 2.5  Hollice Espy, MD

## 2020-06-18 ENCOUNTER — Encounter: Payer: Self-pay | Admitting: Urology

## 2020-06-18 ENCOUNTER — Other Ambulatory Visit: Payer: Self-pay

## 2020-06-18 ENCOUNTER — Ambulatory Visit (INDEPENDENT_AMBULATORY_CARE_PROVIDER_SITE_OTHER): Payer: 59 | Admitting: Urology

## 2020-06-18 VITALS — BP 160/94 | HR 99 | Wt 234.0 lb

## 2020-06-18 DIAGNOSIS — N138 Other obstructive and reflux uropathy: Secondary | ICD-10-CM | POA: Diagnosis not present

## 2020-06-18 DIAGNOSIS — N5203 Combined arterial insufficiency and corporo-venous occlusive erectile dysfunction: Secondary | ICD-10-CM

## 2020-06-18 DIAGNOSIS — R972 Elevated prostate specific antigen [PSA]: Secondary | ICD-10-CM | POA: Diagnosis not present

## 2020-06-18 DIAGNOSIS — N401 Enlarged prostate with lower urinary tract symptoms: Secondary | ICD-10-CM | POA: Diagnosis not present

## 2020-06-18 DIAGNOSIS — R310 Gross hematuria: Secondary | ICD-10-CM | POA: Diagnosis not present

## 2020-06-18 LAB — BLADDER SCAN AMB NON-IMAGING: Scan Result: 94

## 2020-06-18 NOTE — Progress Notes (Signed)
06/18/2020 10:44 AM   Daniel Lee August 27, 1955 [id number]  Referring provider: Abner Greenspan, MD [address] [place],  [name] [address]  Chief Complaint  Patient presents with  . Benign Prostatic Hypertrophy    HPI: 64 year old male with a personal history of BPH, prostamegaly and gross hematuria returns today for 51-month follow-up.  He has a personal history of BPH status post holep.  He continues on finasteride.  He underwent cystoscopy on 12/2019 for gross hematuria associated with heavy lifting.  He had some friable prostatic mucosa with regrowth of his right prostatic lobe.  He also had a mildly elevated PSA.  He returns today with repeat PSA which is trended back down to baseline, 2.5 on 06/2020.  Overall, he is doing symptomatically well.  He said no further episodes of gross hematuria.  His symptoms are well controlled.  His primary complaint today is occasional urinary urgency and frequency but this is not particular bothersome to him.  Good stream.  He feels like he is emptying well.   IPSS    Row Name 06/18/20 1000         International Prostate Symptom Score   How often have you had the sensation of not emptying your bladder? Less than 1 in 5     How often have you had to urinate less than every two hours? About half the time     How often have you found you stopped and started again several times when you urinated? Not at All     How often have you found it difficult to postpone urination? About half the time     How often have you had a weak urinary stream? About half the time     How often have you had to strain to start urination? Not at All     How many times did you typically get up at night to urinate? 1 Time     Total IPSS Score 11       Quality of Life due to urinary symptoms   If you were to spend the rest of your life with your urinary condition just the way it is now how would you feel about that? Delighted            Score:  1-7  Mild 8-19 Moderate 20-35 Severe    PMH: Past Medical History:  Diagnosis Date  . BPH (benign prostatic hypertrophy)   . Diabetes mellitus without complication ([place])   . Diabetes type 2, controlled ([place]) 04/16/2008   Qualifier: Diagnosis of  By: Glori Bickers MD, Carmell Austria   . ED (erectile dysfunction)   . Elevated alanine aminotransferase (ALT) level    suspect fatty liver  . Hyperglycemia   . Hypertension     Surgical History: Past Surgical History:  Procedure Laterality Date  . abd ultrasound  11/2005   negative  . COLONOSCOPY    . HOLEP-LASER ENUCLEATION OF THE PROSTATE WITH MORCELLATION N/A 12/28/2017   Procedure: HOLEP-LASER ENUCLEATION OF THE PROSTATE WITH MORCELLATION;  Surgeon: Hollice Espy, MD;  Location: [facility] ORS;  Service: Urology;  Laterality: N/A;  . PROSTATE SURGERY  2003   prostate biopsy negative // urologist    Home Medications:  Allergies as of 06/18/2020   No Known Allergies     Medication List       Accurate as of June 18, 2020 10:44 AM. If you have any questions, ask your nurse or doctor.  Accu-Chek Aviva Plus test strip Generic drug: glucose blood USE TO CHECK BLOOD SUGAR ONCE DAILY AND AS NEEDED (DX. E11.9)   accu-chek multiclix lancets Use as instructed   amLODipine 5 MG tablet Commonly known as: NORVASC TAKE 1 TABLET BY MOUTH EVERY DAY   aspirin 81 MG EC tablet Take 81 mg by mouth daily.   atorvastatin 10 MG tablet Commonly known as: LIPITOR Take 1 tablet (10 mg total) by mouth daily.   finasteride 5 MG tablet Commonly known as: PROSCAR Take 1 tablet (5 mg total) by mouth daily.   losartan-hydrochlorothiazide 100-25 MG tablet Commonly known as: HYZAAR Take 1 tablet by mouth daily.   metFORMIN 500 MG tablet Commonly known as: GLUCOPHAGE TAKE 1 TABLET BY MOUTH TWICE A DAY WITH A MEAL   sildenafil 20 MG tablet Commonly known as: Revatio Take 1 tablet (20 mg total) by mouth as needed. Take 1-5 tabs as needed prior to  intercourse       Allergies: No Known Allergies  Family History: Family History  Problem Relation Age of Onset  . Cancer Father        prostate cancer   . Diabetes Father   . Stroke Father   . Prostate cancer Father   . Hypertension Mother   . Hyperlipidemia Mother   . Bladder Cancer Mother   . Heart disease Brother   . Diabetes Brother   . Kidney disease Brother   . Cancer Paternal Uncle        prostate cancer  . Kidney cancer Neg Hx     Social History:  reports that he quit smoking about 18 years ago. His smoking use included cigarettes. He smoked 0.25 packs per day. He quit smokeless tobacco use about 18 years ago.  His smokeless tobacco use included chew. He reports that he does not drink alcohol and does not use drugs.   Physical Exam: BP (!) 160/94   Pulse 99   Wt 234 lb (106.1 kg)   BMI 35.58 kg/m   Constitutional:  Alert and oriented, No acute distress. HEENT: [place] AT, moist mucus membranes.  Trachea midline, no masses. Cardiovascular: No clubbing, cyanosis, or edema. Respiratory: Normal respiratory effort, no increased work of breathing. Skin: No rashes, bruises or suspicious lesions. Neurologic: Grossly intact, no focal deficits, moving all 4 extremities. Psychiatric: Normal mood and affect.  Laboratory Data: Lab Results  Component Value Date   WBC 5.2 02/16/2020   HGB 13.8 02/16/2020   HCT 41.2 02/16/2020   MCV 87.1 02/16/2020   PLT 172.0 02/16/2020    Lab Results  Component Value Date   CREATININE 0.95 02/16/2020    Lab Results  Component Value Date   PSA 1.96 02/16/2020   PSA 12.06 (H) 01/22/2017   PSA 10.86 (H) 06/28/2015    Pertinent Imaging: Results for orders placed or performed in visit on 06/18/20  BLADDER SCAN AMB NON-IMAGING  Result Value Ref Range   Scan Result 94 ml      Assessment & Plan:    1. BPH with urinary obstruction Symptoms well controlled with adequate emptying on finasteride only- continue this med  He does  have some urgency frequency symptoms which may or may be related to prostate, desires no further intervention at this time  He does have some right-sided prostatic regrowth which could consider treating if he has recurrent symptoms in the future  - BLADDER SCAN AMB NON-IMAGING  2. Gross hematuria No further episodes, likely prostate related  Continue finasteride  for this reason  3. Elevated PSA PSA trended back to baseline, will recheck in a year with rectal exam  4. Combined arterial insufficiency and corporo-venous occlusive erectile dysfunction Sildenafil as needed  1 year with IPSS/PVR/DRE/ PSA or sooner as needed  Hollice Espy, MD  [facility] [address], [place],  [address] [phone]

## 2020-06-19 ENCOUNTER — Telehealth: Payer: 59 | Admitting: Family Medicine

## 2020-07-13 ENCOUNTER — Other Ambulatory Visit: Payer: Self-pay | Admitting: Urology

## 2020-10-18 ENCOUNTER — Ambulatory Visit (INDEPENDENT_AMBULATORY_CARE_PROVIDER_SITE_OTHER): Payer: 59 | Admitting: Urology

## 2020-10-18 ENCOUNTER — Other Ambulatory Visit: Payer: Self-pay

## 2020-10-18 VITALS — BP 176/94 | HR 104 | Wt 235.0 lb

## 2020-10-18 DIAGNOSIS — R310 Gross hematuria: Secondary | ICD-10-CM | POA: Diagnosis not present

## 2020-10-18 LAB — MICROSCOPIC EXAMINATION
Bacteria, UA: NONE SEEN
RBC, Urine: NONE SEEN /hpf (ref 0–2)
WBC, UA: NONE SEEN /hpf (ref 0–5)

## 2020-10-18 LAB — URINALYSIS, COMPLETE
Bilirubin, UA: NEGATIVE
Glucose, UA: NEGATIVE
Ketones, UA: NEGATIVE
Leukocytes,UA: NEGATIVE
Nitrite, UA: NEGATIVE
RBC, UA: NEGATIVE
Specific Gravity, UA: 1.02 (ref 1.005–1.030)
Urobilinogen, Ur: 0.2 mg/dL (ref 0.2–1.0)
pH, UA: 6.5 (ref 5.0–7.5)

## 2020-10-18 NOTE — Progress Notes (Signed)
10/18/2020 2:05 PM   Everlene Other Mcdanel Oct 28, 1955 [id number]  Referring provider: Abner Greenspan, MD [address] [place],  [place] [address]  Chief Complaint  Patient presents with  . Hematuria    HPI:  65 year old male with personal history of BPH/intermittent gross hematuria who presents today sooner than expected with concerns for gross hematuria.  He has known friable prostatic mucosa with right-sided prostatic regrowth appreciated on previous cystoscopy.  Please see previous notes for details.  Since last visit, he had an episode of painless gross hematuria after working hard in the yard this weekend.  It resolved spontaneously after 1 or 2 voids.  It recurred again on Wednesday.  His wife was concerned enough that she recommend that he come in and be seen.  He is on finasteride only for gross hematuria.  He denies any dysuria, urgency, frequency, or any other symptoms.  He has not had any other episodes of gross hematuria since then.   PMH: Past Medical History:  Diagnosis Date  . BPH (benign prostatic hypertrophy)   . Diabetes mellitus without complication ([place])   . Diabetes type 2, controlled ([place]) 04/16/2008   Qualifier: Diagnosis of  By: Glori Bickers MD, Carmell [place]   . ED (erectile dysfunction)   . Elevated alanine aminotransferase (ALT) level    suspect fatty liver  . Hyperglycemia   . Hypertension     Surgical History: Past Surgical History:  Procedure Laterality Date  . abd ultrasound  11/2005   negative  . COLONOSCOPY    . HOLEP-LASER ENUCLEATION OF THE PROSTATE WITH MORCELLATION N/A 12/28/2017   Procedure: HOLEP-LASER ENUCLEATION OF THE PROSTATE WITH MORCELLATION;  Surgeon: Hollice Espy, MD;  Location: [facility] ORS;  Service: Urology;  Laterality: N/A;  . PROSTATE SURGERY  2003   prostate biopsy negative // urologist    Home Medications:  Allergies as of 10/18/2020   No Known Allergies     Medication List       Accurate as of October 18, 2020   2:05 PM. If you have any questions, ask your nurse or doctor.        Accu-Chek Aviva Plus test strip Generic drug: glucose blood USE TO CHECK BLOOD SUGAR ONCE DAILY AND AS NEEDED (DX. E11.9)   accu-chek multiclix lancets Use as instructed   amLODipine 5 MG tablet Commonly known as: NORVASC TAKE 1 TABLET BY MOUTH EVERY DAY   aspirin 81 MG EC tablet Take 81 mg by mouth daily.   atorvastatin 10 MG tablet Commonly known as: LIPITOR Take 1 tablet (10 mg total) by mouth daily.   finasteride 5 MG tablet Commonly known as: PROSCAR Take 5 mg by mouth daily.   losartan-hydrochlorothiazide 100-25 MG tablet Commonly known as: HYZAAR Take 1 tablet by mouth daily.   metFORMIN 500 MG tablet Commonly known as: GLUCOPHAGE TAKE 1 TABLET BY MOUTH TWICE A DAY WITH A MEAL   sildenafil 20 MG tablet Commonly known as: Revatio Take 1 tablet (20 mg total) by mouth as needed. Take 1-5 tabs as needed prior to intercourse       Allergies: No Known Allergies  Family History: Family History  Problem Relation Age of Onset  . Cancer Father        prostate cancer   . Diabetes Father   . Stroke Father   . Prostate cancer Father   . Hypertension Mother   . Hyperlipidemia Mother   . Bladder Cancer Mother   . Heart disease Brother   .  Diabetes Brother   . Kidney disease Brother   . Cancer Paternal Uncle        prostate cancer  . Kidney cancer Neg Hx     Social History:  reports that he quit smoking about 19 years ago. His smoking use included cigarettes. He smoked 0.25 packs per day. He quit smokeless tobacco use about 19 years ago.  His smokeless tobacco use included chew. He reports that he does not drink alcohol and does not use drugs.   Physical Exam: BP (!) 176/94   Pulse (!) 104   Wt 235 lb (106.6 kg)   BMI 35.73 kg/m   Constitutional:  Alert and oriented, No acute distress. HEENT: [place] AT, moist mucus membranes.  Trachea midline, no masses. Cardiovascular: No clubbing,  cyanosis, or edema. Respiratory: Normal respiratory effort, no increased work of breathing. Skin: No rashes, bruises or suspicious lesions. Neurologic: Grossly intact, no focal deficits, moving all 4 extremities. Psychiatric: Normal mood and affect.  Laboratory Data: Lab Results  Component Value Date   WBC 5.2 02/16/2020   HGB 13.8 02/16/2020   HCT 41.2 02/16/2020   MCV 87.1 02/16/2020   PLT 172.0 02/16/2020    Lab Results  Component Value Date   CREATININE 0.95 02/16/2020    Lab Results  Component Value Date   PSA 1.96 02/16/2020   PSA 12.06 (H) 01/22/2017   PSA 10.86 (H) 06/28/2015     Urinalysis Results for orders placed or performed in visit on 10/18/20  Microscopic Examination   Urine  Result Value Ref Range   WBC, UA None seen 0 - 5 /hpf   RBC None seen 0 - 2 /hpf   Epithelial Cells (non renal) 0-10 0 - 10 /hpf   Bacteria, UA None seen None seen/Few  Urinalysis, Complete  Result Value Ref Range   Specific Gravity, UA 1.020 1.005 - 1.030   pH, UA 6.5 5.0 - 7.5   Color, UA Yellow Yellow   Appearance Ur Clear Clear   Leukocytes,UA Negative Negative   Protein,UA Trace (A) Negative/Trace   Glucose, UA Negative Negative   Ketones, UA Negative Negative   RBC, UA Negative Negative   Bilirubin, UA Negative Negative   Urobilinogen, Ur 0.2 0.2 - 1.0 mg/dL   Nitrite, UA Negative Negative   Microscopic Examination See below:      Assessment & Plan:    1. Gross hematuria Intermittent painless gross hematuria presumably secondary to friable prostatic mucosa, appreciated on most recent cystoscopy less than a year ago, see previous notes for details.  We discussed the options of repeat cystoscopy versus going ahead and treating his prostatic regrowth which is friable/abnormal and likely the source of his bleeding especially given that it occurs after straining/rigorous exercise vs. Continued observation.    Urine today is reassuring, less concerning for additional  underlying causes.  S/p previous workout as outlined above.  If hematuria continues or recurs after 1 year from his last cystoscopy, will repeat  We discussed the option of treating his friable prostatic regrowth, given that he is asymptomatic, he continues to decline  We will have him follow-up as previously scheduled - Urinalysis, Complete   Hollice Espy, MD  [facility] [address] [place], [place] [address] [phone]

## 2020-11-09 ENCOUNTER — Other Ambulatory Visit: Payer: Self-pay | Admitting: Family Medicine

## 2021-01-30 LAB — HM DIABETES EYE EXAM

## 2021-02-05 ENCOUNTER — Encounter: Payer: Self-pay | Admitting: Family Medicine

## 2021-02-20 ENCOUNTER — Telehealth: Payer: Self-pay | Admitting: Family Medicine

## 2021-02-20 DIAGNOSIS — E119 Type 2 diabetes mellitus without complications: Secondary | ICD-10-CM

## 2021-02-20 DIAGNOSIS — E1169 Type 2 diabetes mellitus with other specified complication: Secondary | ICD-10-CM

## 2021-02-20 DIAGNOSIS — E786 Lipoprotein deficiency: Secondary | ICD-10-CM

## 2021-02-20 DIAGNOSIS — E785 Hyperlipidemia, unspecified: Secondary | ICD-10-CM

## 2021-02-20 DIAGNOSIS — I10 Essential (primary) hypertension: Secondary | ICD-10-CM

## 2021-02-20 DIAGNOSIS — N401 Enlarged prostate with lower urinary tract symptoms: Secondary | ICD-10-CM

## 2021-02-20 NOTE — Telephone Encounter (Signed)
-----   Message from Ellamae Sia sent at 02/03/2021  9:25 AM EDT ----- Regarding: Lab orders for Friday, 8.12.22 Patient is scheduled for CPX labs, please order future labs, Thanks , Karna Christmas

## 2021-02-21 ENCOUNTER — Other Ambulatory Visit (INDEPENDENT_AMBULATORY_CARE_PROVIDER_SITE_OTHER): Payer: 59

## 2021-02-21 ENCOUNTER — Other Ambulatory Visit: Payer: Self-pay

## 2021-02-21 DIAGNOSIS — E119 Type 2 diabetes mellitus without complications: Secondary | ICD-10-CM | POA: Diagnosis not present

## 2021-02-21 DIAGNOSIS — E785 Hyperlipidemia, unspecified: Secondary | ICD-10-CM | POA: Diagnosis not present

## 2021-02-21 DIAGNOSIS — E1169 Type 2 diabetes mellitus with other specified complication: Secondary | ICD-10-CM | POA: Diagnosis not present

## 2021-02-21 DIAGNOSIS — I10 Essential (primary) hypertension: Secondary | ICD-10-CM

## 2021-02-21 LAB — COMPREHENSIVE METABOLIC PANEL
ALT: 42 U/L (ref 0–53)
AST: 28 U/L (ref 0–37)
Albumin: 4.3 g/dL (ref 3.5–5.2)
Alkaline Phosphatase: 75 U/L (ref 39–117)
BUN: 22 mg/dL (ref 6–23)
CO2: 28 mEq/L (ref 19–32)
Calcium: 9.5 mg/dL (ref 8.4–10.5)
Chloride: 105 mEq/L (ref 96–112)
Creatinine, Ser: 1.02 mg/dL (ref 0.40–1.50)
GFR: 76.4 mL/min (ref >60.00)
Glucose, Bld: 127 mg/dL — ABNORMAL HIGH (ref 70–99)
Potassium: 4.2 mEq/L (ref 3.5–5.1)
Sodium: 140 mEq/L (ref 135–145)
Total Bilirubin: 1.7 mg/dL — ABNORMAL HIGH (ref 0.2–1.2)
Total Protein: 6.8 g/dL (ref 6.0–8.3)

## 2021-02-21 LAB — CBC WITH DIFFERENTIAL/PLATELET
Basophils Absolute: 0 10*3/uL (ref 0.0–0.1)
Basophils Relative: 0.8 % (ref 0.0–3.0)
Eosinophils Absolute: 0 10*3/uL (ref 0.0–0.7)
Eosinophils Relative: 0.8 % (ref 0.0–5.0)
HCT: 40.8 % (ref 39.0–52.0)
Hemoglobin: 13.6 g/dL (ref 13.0–17.0)
Lymphocytes Relative: 29 % (ref 12.0–46.0)
Lymphs Abs: 1.5 10*3/uL (ref 0.7–4.0)
MCHC: 33.3 g/dL (ref 30.0–36.0)
MCV: 88 fl (ref 78.0–100.0)
Monocytes Absolute: 0.5 10*3/uL (ref 0.1–1.0)
Monocytes Relative: 10.3 % (ref 3.0–12.0)
Neutro Abs: 3.1 10*3/uL (ref 1.4–7.7)
Neutrophils Relative %: 59.1 % (ref 43.0–77.0)
Platelets: 169 10*3/uL (ref 150.0–400.0)
RBC: 4.64 Mil/uL (ref 4.22–5.81)
RDW: 13.6 % (ref 11.5–15.5)
WBC: 5.2 10*3/uL (ref 4.0–10.5)

## 2021-02-21 LAB — LIPID PANEL
Cholesterol: 93 mg/dL (ref 0–200)
HDL: 34.1 mg/dL — ABNORMAL LOW (ref >39.00)
LDL Cholesterol: 39 mg/dL (ref 0–99)
NonHDL: 59.01
Total CHOL/HDL Ratio: 3
Triglycerides: 99 mg/dL (ref 0.0–149.0)
VLDL: 19.8 mg/dL (ref 0.0–40.0)

## 2021-02-21 LAB — TSH: TSH: 2.48 u[IU]/mL (ref 0.35–5.50)

## 2021-02-21 LAB — HEMOGLOBIN A1C: Hgb A1c MFr Bld: 6.8 % — ABNORMAL HIGH (ref 4.6–6.5)

## 2021-02-28 ENCOUNTER — Encounter: Payer: 59 | Admitting: Family Medicine

## 2021-03-11 ENCOUNTER — Encounter: Payer: Self-pay | Admitting: Family Medicine

## 2021-03-11 ENCOUNTER — Ambulatory Visit (INDEPENDENT_AMBULATORY_CARE_PROVIDER_SITE_OTHER): Payer: 59 | Admitting: Family Medicine

## 2021-03-11 ENCOUNTER — Other Ambulatory Visit: Payer: Self-pay

## 2021-03-11 VITALS — BP 134/78 | HR 74 | Temp 97.6°F | Ht 68.25 in | Wt 227.0 lb

## 2021-03-11 DIAGNOSIS — Z23 Encounter for immunization: Secondary | ICD-10-CM

## 2021-03-11 DIAGNOSIS — N401 Enlarged prostate with lower urinary tract symptoms: Secondary | ICD-10-CM

## 2021-03-11 DIAGNOSIS — E1169 Type 2 diabetes mellitus with other specified complication: Secondary | ICD-10-CM

## 2021-03-11 DIAGNOSIS — I10 Essential (primary) hypertension: Secondary | ICD-10-CM

## 2021-03-11 DIAGNOSIS — Z0000 Encounter for general adult medical examination without abnormal findings: Secondary | ICD-10-CM | POA: Diagnosis not present

## 2021-03-11 DIAGNOSIS — E119 Type 2 diabetes mellitus without complications: Secondary | ICD-10-CM

## 2021-03-11 DIAGNOSIS — E785 Hyperlipidemia, unspecified: Secondary | ICD-10-CM

## 2021-03-11 DIAGNOSIS — R972 Elevated prostate specific antigen [PSA]: Secondary | ICD-10-CM

## 2021-03-11 MED ORDER — LOSARTAN POTASSIUM-HCTZ 100-25 MG PO TABS: 1.0000 | ORAL_TABLET | Freq: Every day | ORAL | 3 refills | Status: DC

## 2021-03-11 MED ORDER — AMLODIPINE BESYLATE 5 MG PO TABS: 5.0000 mg | ORAL_TABLET | Freq: Every day | ORAL | 3 refills | Status: DC

## 2021-03-11 MED ORDER — ATORVASTATIN CALCIUM 10 MG PO TABS: 10.0000 mg | ORAL_TABLET | Freq: Every day | ORAL | 3 refills | Status: DC

## 2021-03-11 MED ORDER — METFORMIN HCL 500 MG PO TABS: ORAL_TABLET | ORAL | 3 refills | Status: DC

## 2021-03-11 NOTE — Assessment & Plan Note (Signed)
Lab Results  Component Value Date   HGBA1C 6.8 (H) 02/21/2021   This is stable Well controlled with metformin 500 mg bid and diet  Taking statin and arb Eye exam utd Good foot care

## 2021-03-11 NOTE — Assessment & Plan Note (Signed)
bp in fair control at this time  BP Readings from Last 1 Encounters:  03/11/21 134/78   No changes needed Most recent labs reviewed  Disc lifstyle change with low sodium diet and exercise  Plan to continue losartan hct 10-25 mg daily and amlodipine 5 mg daily

## 2021-03-11 NOTE — Assessment & Plan Note (Signed)
Continues urology care for this and BPH

## 2021-03-11 NOTE — Assessment & Plan Note (Signed)
Reviewed health habits including diet and exercise and skin cancer prevention Reviewed appropriate screening tests for age  Also reviewed health mt list, fam hx and immunization status , as well as social and family history   See HPI Labs reviewed Enc more exercise  Plans to check on coverage of shingrix  Flu shot today  Eye exam utd  Colonoscopy 11/13 with 10 y recall

## 2021-03-11 NOTE — Patient Instructions (Addendum)
If you are interested in the new shingles vaccine (Shingrix) - call your local pharmacy to check on coverage and availability  If affordable, get on a wait list at your pharmacy to get the vaccine.  Separate that from a covid vaccine by a month   You can try omega 3 fish oil  Exercise -most important for HDL   Think about a dermatology visit for skin cancer screening   Flu shot today   Follow up in 6 months

## 2021-03-11 NOTE — Progress Notes (Signed)
Subjective:    Patient ID: Daniel Lee, male    DOB: 20-Aug-1955, 65 y.o.   MRN: [id number]  This visit occurred during the SARS-CoV-2 public health emergency.  Safety protocols were in place, including screening questions prior to the visit, additional usage of staff PPE, and extensive cleaning of exam room while observing appropriate contact time as indicated for disinfecting solutions.   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 Glaucoma suspect)  MGGM- ? Glaucoma   Prostate health BPH Sees urology  Father had prostate cancer Mother had bladder cancer  Lab Results  Component Value Date   PSA1 2.5 06/14/2020   PSA1 4.8 (H) 12/19/2019   PSA1 4.2 (H) 06/20/2019   PSA 1.96 02/16/2020   PSA 12.06 (H) 01/22/2017   PSA 10.86 (H) 06/28/2015  Avodart 0.5 mg daily  Sildenafil for ED  No clinical changes since he had some blood in urine earlier in the year  Is prostate related and happens with heavy lifting  F/u is in December    Colonoscopy 11/13  Thinks he is due 05/2022  HTN bp is stable today  No cp or palpitations or headaches or edema  No side effects to medicines  BP Readings from Last 3 Encounters:  03/11/21 134/78  10/18/20 (!) 176/94  06/18/20 (!) 160/94    Losartan hct 10-25 mg daily  Amlodipine 5 mg daily   Lab Results   Component Value Date   CREATININE 1.02 02/21/2021   BUN 22 02/21/2021   NA 140 02/21/2021   K 4.2 02/21/2021   CL 105 02/21/2021   CO2 28 02/21/2021    DM2 Lab Results  Component Value Date   HGBA1C 6.8 (H) 02/21/2021  This is down from 6.9  Taking statin and arb Metformin 500 mg bid  Lab Results  Component Value Date   CHOL 93 02/21/2021   CHOL 105 02/16/2020   CHOL 95 08/17/2019   Lab Results  Component Value Date   HDL 34.10 (L) 02/21/2021   HDL 35.90 (L) 02/16/2020   HDL 34.30 (L) 08/17/2019   Lab Results  Component Value Date   LDLCALC 39 02/21/2021   LDLCALC 54 02/16/2020   LDLCALC 43 08/17/2019   Lab Results  Component Value Date   TRIG 99.0 02/21/2021   TRIG 75.0 02/16/2020   TRIG 89.0 08/17/2019   Lab Results  Component Value Date   CHOLHDL 3 02/21/2021   CHOLHDL 3 02/16/2020   CHOLHDL 3 08/17/2019   Lab Results  Component Value Date   LDLDIRECT 99.9 06/23/2011   Atorvastatin 10 mg daily  Tried eating more nuts for HDL    Other labs Lab Results  Component Value Date  WBC 5.2 02/21/2021   HGB 13.6 02/21/2021   HCT 40.8 02/21/2021   MCV 88.0 02/21/2021   PLT 169.0 02/21/2021   Lab Results  Component Value Date   TSH 2.48 02/21/2021   Lab Results  Component Value Date   WBC 5.2 02/21/2021   HGB 13.6 02/21/2021   HCT 40.8 02/21/2021   MCV 88.0 02/21/2021   PLT 169.0 02/21/2021   Patient Active Problem List   Diagnosis Date Noted   Ganglion cyst of right foot 05/24/2020   Routine general medical examination at a health care facility 06/22/2011   ESSENTIAL HYPERTENSION, BENIGN 04/25/2009   Hyperlipidemia associated with type 2 diabetes mellitus ([place]) 07/19/2008   Diabetes type 2, controlled ([place]) 04/16/2008   Low HDL (under 40) 03/18/2007   ERECTILE DYSFUNCTION 10/07/2006   PROSTATE SPECIFIC ANTIGEN, ELEVATED 10/07/2006   BPH (benign prostatic hyperplasia) 10/07/2006   Past Medical History:  Diagnosis Date   BPH (benign  prostatic hypertrophy)    Diabetes mellitus without complication ([place])    Diabetes type 2, controlled ([place]) 04/16/2008   Qualifier: Diagnosis of  By: Glori Bickers MD, Carmell Austria    ED (erectile dysfunction)    Elevated alanine aminotransferase (ALT) level    suspect fatty liver   Hyperglycemia    Hypertension    Past Surgical History:  Procedure Laterality Date   abd ultrasound  11/2005   negative   COLONOSCOPY     HOLEP-LASER ENUCLEATION OF THE PROSTATE WITH MORCELLATION N/A 12/28/2017   Procedure: HOLEP-LASER ENUCLEATION OF THE PROSTATE WITH MORCELLATION;  Surgeon: Hollice Espy, MD;  Location: [facility] ORS;  Service: Urology;  Laterality: N/A;   PROSTATE SURGERY  2003   prostate biopsy negative // urologist   Social History   Tobacco Use   Smoking status: Former    Packs/day: 0.25    Types: Cigarettes    Quit date: 07/13/2001    Years since quitting: 19.6   Smokeless tobacco: Former    Types: Chew    Quit date: 07/13/2001   Tobacco comments:    smoked a cigarette every couple of days  Substance Use Topics   Alcohol use: No    Alcohol/week: 0.0 standard drinks   Drug use: No   Family History  Problem Relation Age of Onset   Cancer Father        prostate cancer    Diabetes Father    Stroke Father    Prostate cancer Father    Hypertension Mother    Hyperlipidemia Mother    Bladder Cancer Mother    Heart disease Brother    Diabetes Brother    Kidney disease Brother    Cancer Paternal Uncle        prostate cancer   Kidney cancer Neg Hx    No Known Allergies Current Outpatient Medications on File Prior to Visit  Medication Sig Dispense Refill   ACCU-CHEK AVIVA PLUS test strip USE TO CHECK BLOOD SUGAR ONCE DAILY AND AS NEEDED (DX. E11.9) 100 strip 2   aspirin 81 MG EC tablet Take 81 mg by mouth daily.     dutasteride (AVODART) 0.5 MG capsule Take 0.5 mg by mouth daily.     Lancets (ACCU-CHEK MULTICLIX) lancets Use as instructed 100 each 12   sildenafil (REVATIO) 20 MG tablet  Take 1 tablet (20 mg total) by mouth as needed. Take 1-5 tabs as needed prior to intercourse 30 tablet 11   No current facility-administered medications on file prior to visit.  Review of Systems  Constitutional:  Negative for activity change, appetite change, fatigue, fever and unexpected weight change.  HENT:  Negative for congestion, rhinorrhea, sore throat and trouble swallowing.   Eyes:  Negative for pain, redness, itching and visual disturbance.  Respiratory:  Negative for cough, chest tightness, shortness of breath and wheezing.   Cardiovascular:  Negative for chest pain and palpitations.  Gastrointestinal:  Negative for abdominal pain, blood in stool, constipation, diarrhea and nausea.  Endocrine: Negative for cold intolerance, heat intolerance, polydipsia and polyuria.  Genitourinary:  Negative for difficulty urinating, dysuria, frequency and urgency.  Musculoskeletal:  Negative for arthralgias, joint swelling and myalgias.  Skin:  Negative for pallor and rash.       Injured R great toe nail on a door  Neurological:  Negative for dizziness, tremors, weakness, numbness and headaches.  Hematological:  Negative for adenopathy. Does not bruise/bleed easily.  Psychiatric/Behavioral:  Negative for decreased concentration and dysphoric mood. The patient is not nervous/anxious.       Objective:   Physical Exam Constitutional:      General: He is not in acute distress.    Appearance: Normal appearance. He is well-developed. He is obese. He is ill-appearing. He is not diaphoretic.  HENT:     Head: Normocephalic and atraumatic.     Right Ear: Tympanic membrane, ear canal and external ear normal.     Left Ear: Tympanic membrane, ear canal and external ear normal.     Nose: Nose normal. No congestion.     Mouth/Throat:     Mouth: Mucous membranes are moist.     Pharynx: Oropharynx is clear. No posterior oropharyngeal erythema.  Eyes:     General: No scleral icterus.       Right  eye: No discharge.        Left eye: No discharge.     Conjunctiva/sclera: Conjunctivae normal.     Pupils: Pupils are equal, round, and reactive to light.  Neck:     Thyroid: No thyromegaly.     Vascular: No carotid bruit or JVD.  Cardiovascular:     Rate and Rhythm: Normal rate and regular rhythm.     Pulses: Normal pulses.     Heart sounds: Normal heart sounds.    No gallop.  Pulmonary:     Effort: Pulmonary effort is normal. No respiratory distress.     Breath sounds: Normal breath sounds. No wheezing or rales.     Comments: Good air exch Chest:     Chest wall: No tenderness.  Abdominal:     General: Bowel sounds are normal. There is no distension or abdominal bruit.     Palpations: Abdomen is soft. There is no mass.     Tenderness: There is no abdominal tenderness.     Hernia: No hernia is present.  Musculoskeletal:        General: No tenderness.     Cervical back: Normal range of motion and neck supple. No rigidity. No muscular tenderness.     Right lower leg: No edema.     Left lower leg: No edema.  Lymphadenopathy:     Cervical: No cervical adenopathy.  Skin:    General: Skin is warm and dry.     Coloration: Skin is not pale.     Findings: No erythema or rash.     Comments: R great toe nail is too short from trauma No open skin   Solar lentigines diffusely Some sks on back and trunk Some  regular shaped nevi on back /brown  Neurological:     Mental Status: He is alert.     Cranial Nerves: No cranial nerve deficit.     Motor: No abnormal muscle tone.     Coordination: Coordination normal.     Gait: Gait normal.     Deep Tendon Reflexes: Reflexes are normal and symmetric. Reflexes normal.  Psychiatric:        Mood and Affect: Mood normal.        Cognition and Memory: Cognition and memory normal.          Assessment & Plan:   Problem List Items Addressed This Visit       Cardiovascular and Mediastinum   ESSENTIAL HYPERTENSION, BENIGN    bp in fair  control at this time  BP Readings from Last 1 Encounters:  03/11/21 134/78  No changes needed Most recent labs reviewed  Disc lifstyle change with low sodium diet and exercise  Plan to continue losartan hct 10-25 mg daily and amlodipine 5 mg daily      Relevant Medications   amLODipine (NORVASC) 5 MG tablet   atorvastatin (LIPITOR) 10 MG tablet   losartan-hydrochlorothiazide (HYZAAR) 100-25 MG tablet     Endocrine   Diabetes type 2, controlled ([place])    Lab Results  Component Value Date   HGBA1C 6.8 (H) 02/21/2021  This is stable Well controlled with metformin 500 mg bid and diet  Taking statin and arb Eye exam utd Good foot care       Relevant Medications   atorvastatin (LIPITOR) 10 MG tablet   losartan-hydrochlorothiazide (HYZAAR) 100-25 MG tablet   metFORMIN (GLUCOPHAGE) 500 MG tablet   Hyperlipidemia associated with type 2 diabetes mellitus (HCC)    Disc goals for lipids and reasons to control them Rev last labs with pt Rev low sat fat diet in detail HDL is still low Plans to try omega 3 again  Plans to continue atorvastatin 10 mg daily        Relevant Medications   atorvastatin (LIPITOR) 10 MG tablet   losartan-hydrochlorothiazide (HYZAAR) 100-25 MG tablet   metFORMIN (GLUCOPHAGE) 500 MG tablet     Genitourinary   BPH (benign prostatic hyperplasia)    Continues urology care  psa1 2.5 in December  Father had prostate cancer  No clinical changes       Relevant Medications   dutasteride (AVODART) 0.5 MG capsule     Other   PROSTATE SPECIFIC ANTIGEN, ELEVATED    Continues urology care for this and BPH      Routine general medical examination at a health care facility - Primary    Reviewed health habits including diet and exercise and skin cancer prevention Reviewed appropriate screening tests for age  Also reviewed health mt list, fam hx and immunization status , as well as social and family history   See HPI Labs reviewed Enc more exercise  Plans  to check on coverage of shingrix  Flu shot today  Eye exam utd  Colonoscopy 11/13 with 10 y recall       Other Visit Diagnoses     Need for influenza vaccination       Relevant Orders   Flu Vaccine QUAD High Dose(Fluad) (Completed)

## 2021-03-11 NOTE — Assessment & Plan Note (Signed)
Disc goals for lipids and reasons to control them Rev last labs with pt Rev low sat fat diet in detail HDL is still low Plans to try omega 3 again  Plans to continue atorvastatin 10 mg daily

## 2021-03-11 NOTE — Assessment & Plan Note (Signed)
Continues urology care  psa1 2.5 in December  Father had prostate cancer  No clinical changes

## 2021-04-11 IMAGING — CT CT ABDOMEN AND PELVIS WITHOUT AND WITH CONTRAST
3 of 13 series · 12 of 46 positions shown, 18 images · IV contrast (omnipaque)
Comparison: None.

CLINICAL DATA: Gross hematuria.

EXAM:
CT ABDOMEN AND PELVIS WITHOUT AND WITH CONTRAST
TECHNIQUE: Multidetector CT imaging of the abdomen and pelvis was performed
following the standard protocol before and following the bolus
administration of intravenous contrast.
CONTRAST:  125mL OMNIPAQUE IOHEXOL 300 MG/ML  SOLN

[Series 5: coronal without pre · coronal · non-contrast · 0.88mm/px · 2 of 153 slices shown, 3 images]
[im 51/153  soft-tissue]
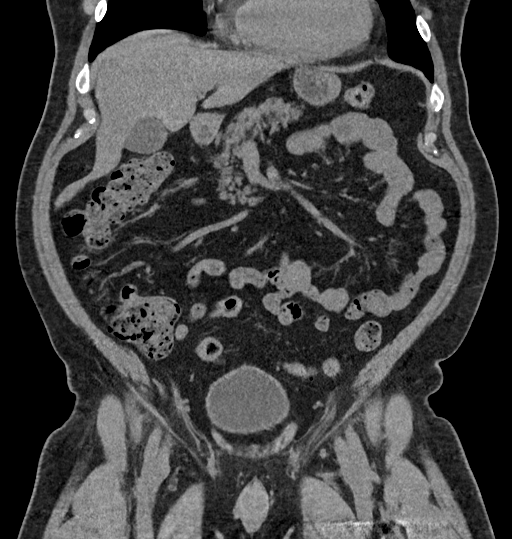
[im 51/153  bone]
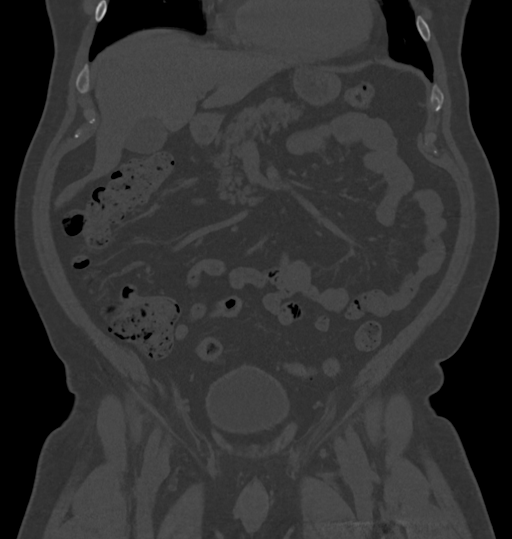
[im 102/153  soft-tissue]
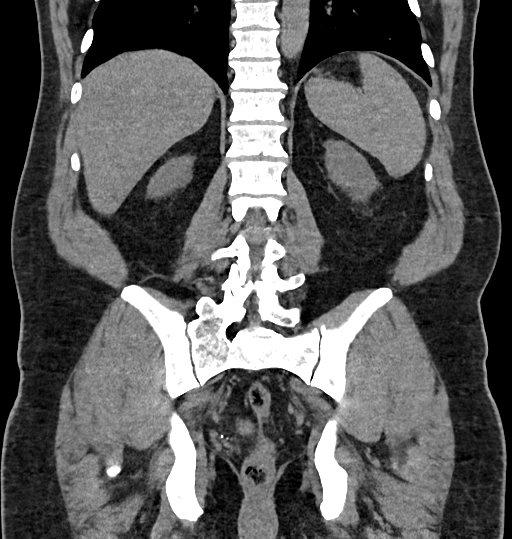

[Series 17: axial delay prone · axial · delayed · 0.86mm/px · z∈[-1452,-1092]mm · 5 of 108 slices shown, 10 images (1 of 2)]
[im 18/108  soft-tissue]
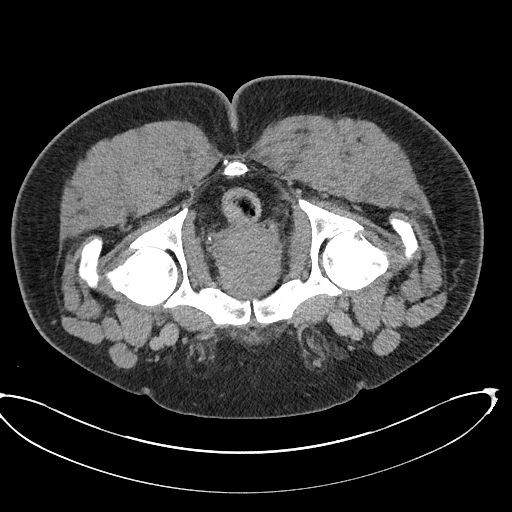
[im 18/108  bone]
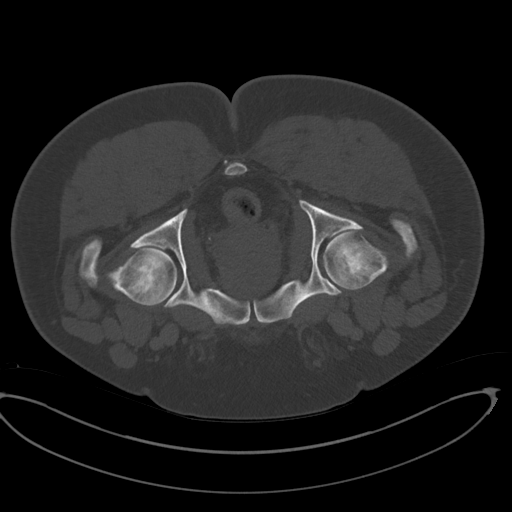
[im 36/108  soft-tissue]
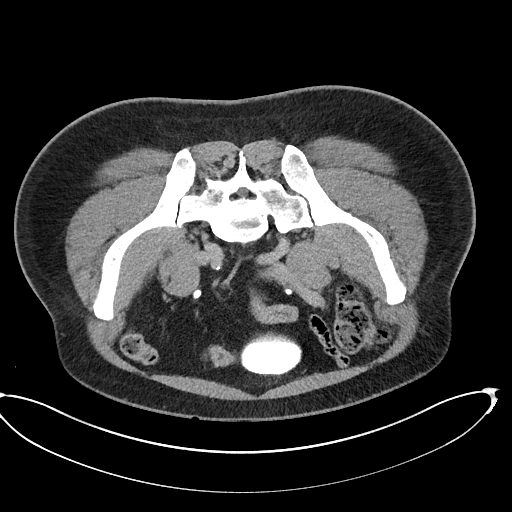
[im 36/108  lung]
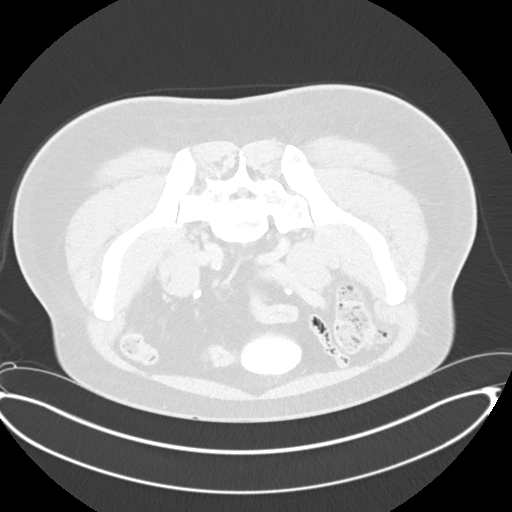
[im 54/108  soft-tissue]
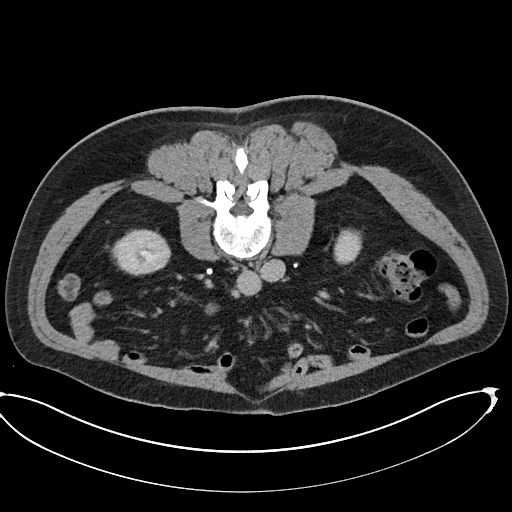
[im 54/108  lung]
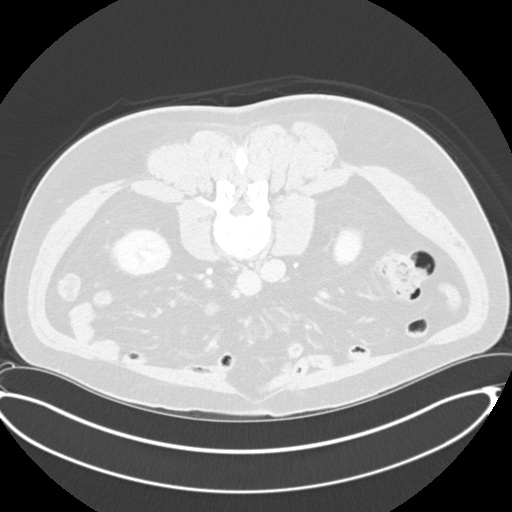
[im 72/108  soft-tissue]
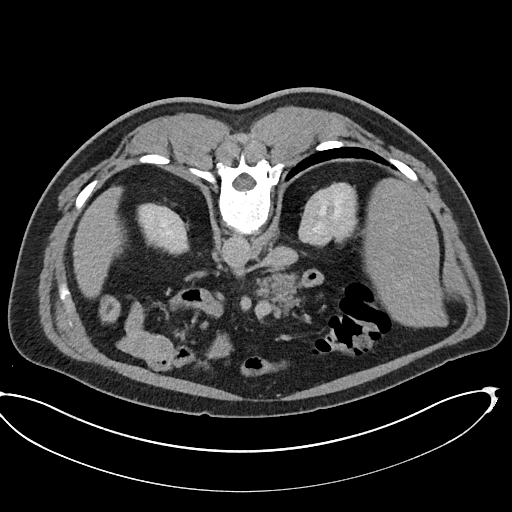
[im 72/108  lung]
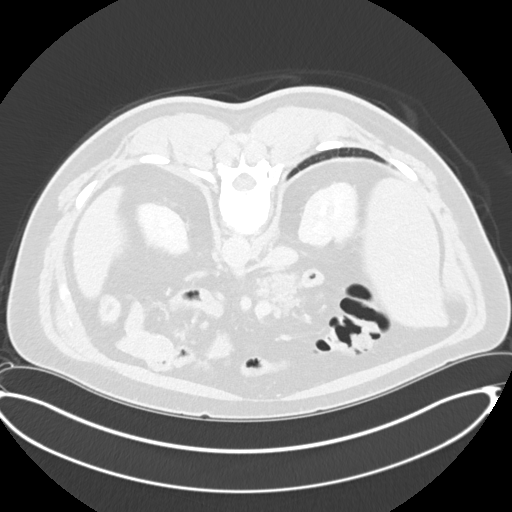
[im 90/108  soft-tissue]
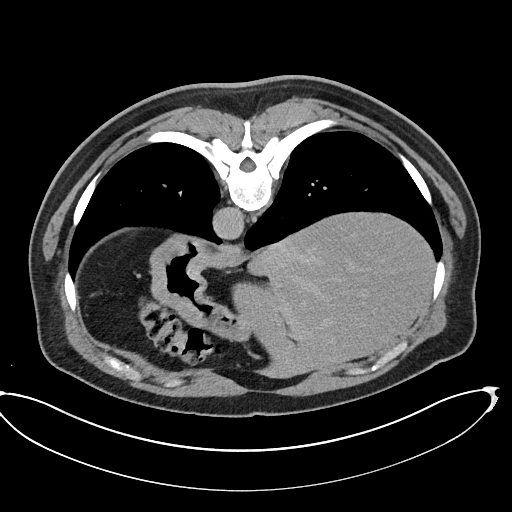
[im 90/108  lung]
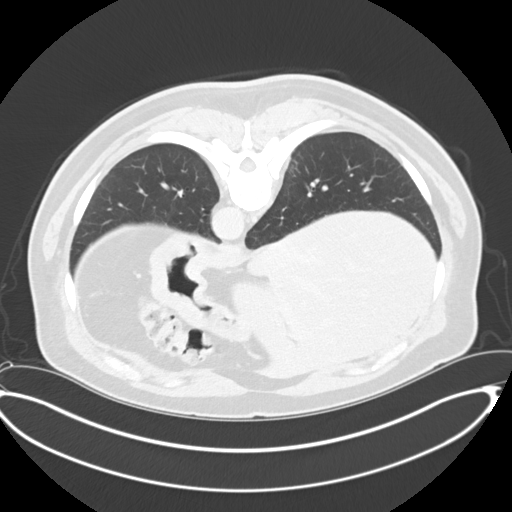

[Series 22: axial delay prone · axial · delayed · 0.86mm/px · z∈[-1452,-1092]mm · 5 of 108 slices shown (2 of 2)]
[im 18/108  soft-tissue]
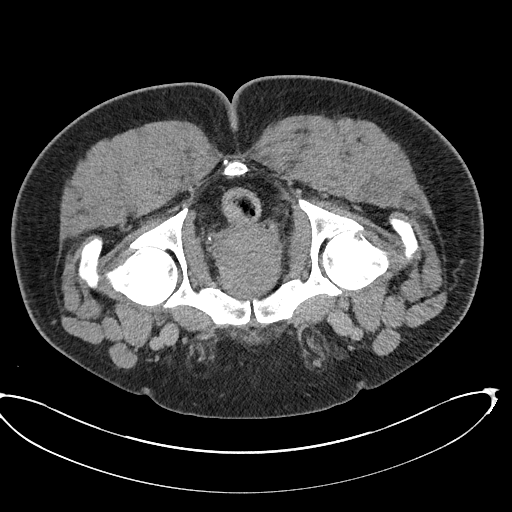
[im 36/108  soft-tissue]
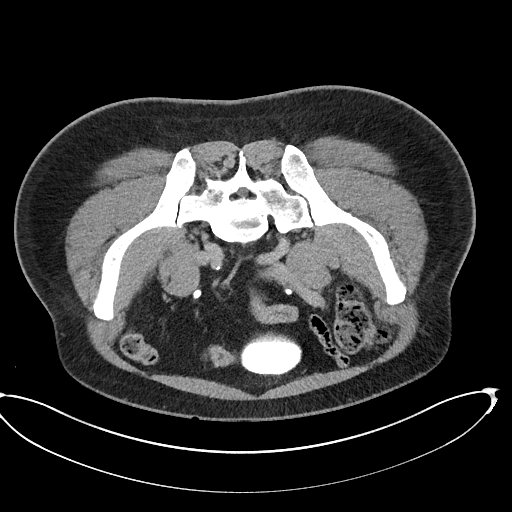
[im 54/108  soft-tissue]
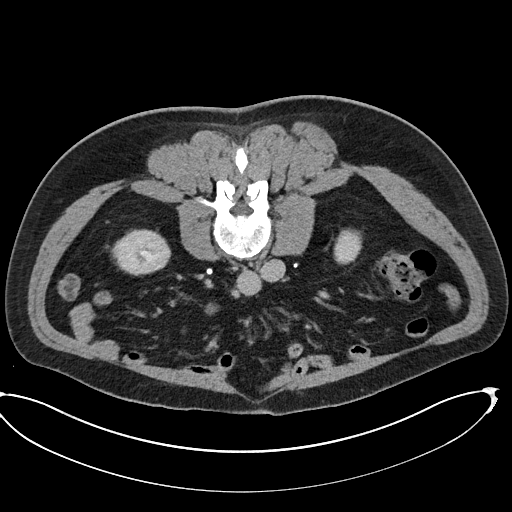
[im 72/108  soft-tissue]
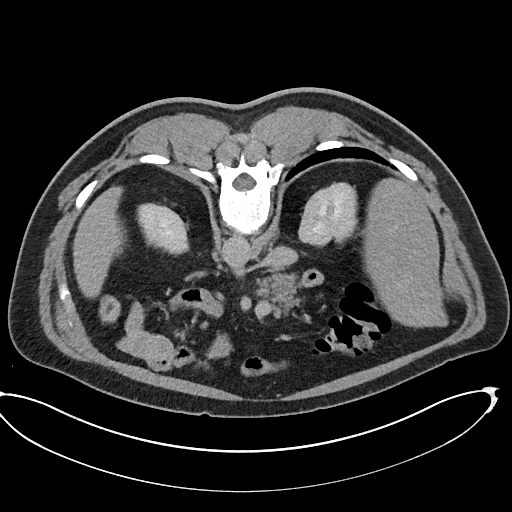
[im 90/108  soft-tissue]
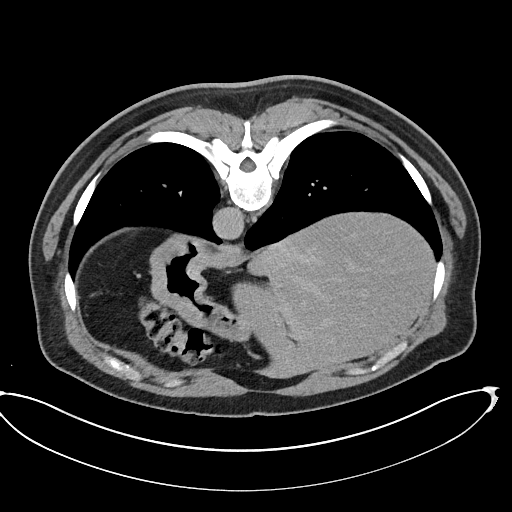

[12 of 46 positions shown; findings below may reference images not displayed]

FINDINGS: Lower chest: No acute abnormality.

Hepatobiliary: Small low-attenuation structure within posterior
liver measures 8 mm, image [DATE] and is too small to characterize.
The gallbladder is normal. No biliary dilatation.

Pancreas: Unremarkable. No pancreatic ductal dilatation or
surrounding inflammatory changes.

Spleen: Normal in size without focal abnormality.

Adrenals/Urinary Tract: Normal appearance of the adrenal glands.

Lateral cortex right mid kidney cyst measures 2.3 cm. No kidney
stones identified bilaterally. No hydronephrosis. Collecting systems
are unremarkable. No hydroureter. No suspicious ureteral filling
defects. Enlarged prostate gland has mass effect upon the posterior
bladder base. No bladder calculi or focal bladder lesion identified.

Stomach/Bowel: Stomach is within normal limits. Appendix appears
normal. No evidence of bowel wall thickening, distention, or
inflammatory changes.

Vascular/Lymphatic: No significant vascular findings are present. No
enlarged abdominal or pelvic lymph nodes.

Reproductive: Prostate gland is markedly enlarged measuring 9.5 by
7.5 by 5.3 cm (volume = 200 cm^3).

Other: Small fat containing umbilical hernia. No free fluid or fluid
collection.

Musculoskeletal: No acute or suspicious osseous abnormality.
IMPRESSION: 1. No urinary tract abnormality to explain patient's hematuria.
2. Marked prostate gland enlargement.
3. Right kidney cysts.

## 2021-06-12 ENCOUNTER — Other Ambulatory Visit: Payer: Self-pay | Admitting: Urology

## 2021-06-16 ENCOUNTER — Other Ambulatory Visit: Payer: 59

## 2021-06-16 ENCOUNTER — Other Ambulatory Visit: Payer: Self-pay

## 2021-06-16 DIAGNOSIS — R972 Elevated prostate specific antigen [PSA]: Secondary | ICD-10-CM

## 2021-06-17 LAB — PSA: Prostate Specific Ag, Serum: 2.3 ng/mL (ref 0.0–4.0)

## 2021-06-17 NOTE — Progress Notes (Signed)
06/18/21 10:15 AM   [place] [birth year] [id number]  Referring provider:  Abner Greenspan, MD [address] [place],  [place] [address] Chief Complaint  Patient presents with   Benign Prostatic Hypertrophy     HPI: Daniel Lee is a 65 y.o.male with a personal history of BPH, intermittent gross hematuria, and elevated PSA, who presents today for 1 year follow-up with IPSS, PSA, and PVR.   He is s/p HoLEP.  He continues on finasteride for intermittent gross hematuria.   He underwent cystoscopy on 12/2019 for gross hematuria associated with heavy lifting.  He had some friable prostatic mucosa with regrowth of his right prostatic lobe.   He also had a mildly elevated PSA.  He returns today with repeat PSA which is trended back down to baseline 2.3 as of 06/16/2021.   He reports today that he is doing well. He is still taking his finasteride, he had one episode of hematuria in April.   He also has a personal history of erectile dysfunction.  He is requesting a prescription for sildenafil today.  He uses this in the past successfully.  PSA trend:  Component Prostate Specific Ag, Serum  Latest Ref Rng & Units 0.0 - 4.0 ng/mL  09/24/2017 13.5 (H)  08/12/2018 6.3 (H)  06/20/2019 4.2 (H)  12/19/2019 4.8 (H)  06/14/2020 2.5  06/16/2021 2.3    IPSS     Row Name 06/18/21 1000         International Prostate Symptom Score   How often have you had the sensation of not emptying your bladder? More than half the time     How often have you had to urinate less than every two hours? About half the time     How often have you found you stopped and started again several times when you urinated? Not at All     How often have you found it difficult to postpone urination? Less than 1 in 5 times     How often have you had a weak urinary stream? Less than 1 in 5 times     How often have you had to strain to start urination? Not at All     How many times did you typically get up  at night to urinate? 1 Time     Total IPSS Score 10       Quality of Life due to urinary symptoms   If you were to spend the rest of your life with your urinary condition just the way it is now how would you feel about that? Pleased              Score:  1-7 Mild 8-19 Moderate 20-35 Severe   PMH: Past Medical History:  Diagnosis Date   BPH (benign prostatic hypertrophy)    Diabetes mellitus without complication ([place])    Diabetes type 2, controlled ([place]) 04/16/2008   Qualifier: Diagnosis of  By: Glori Bickers MD, Carmell Austria    ED (erectile dysfunction)    Elevated alanine aminotransferase (ALT) level    suspect fatty liver   Hyperglycemia    Hypertension     Surgical History: Past Surgical History:  Procedure Laterality Date   abd ultrasound  11/2005   negative   COLONOSCOPY     HOLEP-LASER ENUCLEATION OF THE PROSTATE WITH MORCELLATION N/A 12/28/2017   Procedure: HOLEP-LASER ENUCLEATION OF THE PROSTATE WITH MORCELLATION;  Surgeon: Hollice Espy, MD;  Location: [facility] ORS;  Service: Urology;  Laterality: N/A;   PROSTATE SURGERY  2003   prostate biopsy negative // urologist    Home Medications:  Allergies as of 06/18/2021   No Known Allergies      Medication List        Accurate as of June 18, 2021 10:15 AM. If you have any questions, ask your nurse or doctor.          STOP taking these medications    aspirin 81 MG EC tablet Stopped by: Hollice Espy, MD       TAKE these medications    Accu-Chek Aviva Plus test strip Generic drug: glucose blood USE TO CHECK BLOOD SUGAR ONCE DAILY AND AS NEEDED (DX. E11.9)   accu-chek multiclix lancets Use as instructed   amLODipine 5 MG tablet Commonly known as: NORVASC Take 1 tablet (5 mg total) by mouth daily.   atorvastatin 10 MG tablet Commonly known as: LIPITOR Take 1 tablet (10 mg total) by mouth daily.   dutasteride 0.5 MG capsule Commonly known as: AVODART TAKE 1 CAPSULE BY MOUTH EVERY DAY    losartan-hydrochlorothiazide 100-25 MG tablet Commonly known as: HYZAAR Take 1 tablet by mouth daily.   metFORMIN 500 MG tablet Commonly known as: GLUCOPHAGE TAKE 1 TABLET BY MOUTH TWICE A DAY WITH MEALS   sildenafil 20 MG tablet Commonly known as: Revatio Take 1 tablet (20 mg total) by mouth as needed. Take 1-5 tabs as needed prior to intercourse        Allergies: No Known Allergies  Family History: Family History  Problem Relation Age of Onset   Cancer Father        prostate cancer    Diabetes Father    Stroke Father    Prostate cancer Father    Hypertension Mother    Hyperlipidemia Mother    Bladder Cancer Mother    Heart disease Brother    Diabetes Brother    Kidney disease Brother    Cancer Paternal Uncle        prostate cancer   Kidney cancer Neg Hx     Social History:  reports that he quit smoking about 19 years ago. His smoking use included cigarettes. He smoked an average of .25 packs per day. He quit smokeless tobacco use about 19 years ago.  His smokeless tobacco use included chew. He reports that he does not drink alcohol and does not use drugs.   Physical Exam: BP (!) 180/93   Pulse 80   Ht 5\' 8"  (1.727 m)   Wt 227 lb (103 kg)   BMI 34.52 kg/m   Constitutional:  Alert and oriented, No acute distress. HEENT: [place] AT, moist mucus membranes.  Trachea midline, no masses. Cardiovascular: No clubbing, cyanosis, or edema. Respiratory: Normal respiratory effort, no increased work of breathing. Rectal: Normal sphincter tone,  50+  CC prostate, smooth no nodules Skin: No rashes, bruises or suspicious lesions. Neurologic: Grossly intact, no focal deficits, moving all 4 extremities. Psychiatric: Normal mood and affect.  Laboratory Data: Lab Results  Component Value Date   CREATININE 1.02 02/21/2021   Lab Results  Component Value Date   HGBA1C 6.8 (H) 02/21/2021   Pertinent Imaging: Results for orders placed or performed in visit on 06/18/21  BLADDER  SCAN AMB NON-IMAGING  Result Value Ref Range   Scan Result 16 ml    Assessment & Plan:    BPH with Urinary obstruction  - Symptoms well controlled with adequate emptying on finasteride only - continue this med  2. History gross hematuria  - non-friable prostatic mucosa  - not had any further bleeding episodes on finasteride.  - Continue Finasteride   3. Elevated PSA  - PSA trended back to baseline, will recheck in a year with rectal exam   4. Combined arterial insufficiency and corporo-venous occlusive erectile dysfunction - Sildenafil refilled; take as needed   Return in 1 year (06/18/2022) for IPSS, PVR, PSA, and DRE  I,Kailey Littlejohn,acting as a scribe for Hollice Espy, MD.,have documented all relevant documentation on the behalf of Hollice Espy, MD,as directed by  Hollice Espy, MD while in the presence of Hollice Espy, MD.  I have reviewed the above documentation for accuracy and completeness, and I agree with the above.   Hollice Espy, MD   [facility] [address] [place], [place] [address] [phone]

## 2021-06-18 ENCOUNTER — Other Ambulatory Visit: Payer: Self-pay

## 2021-06-18 ENCOUNTER — Ambulatory Visit (INDEPENDENT_AMBULATORY_CARE_PROVIDER_SITE_OTHER): Payer: 59 | Admitting: Urology

## 2021-06-18 VITALS — BP 180/93 | HR 80 | Ht 68.0 in | Wt 227.0 lb

## 2021-06-18 DIAGNOSIS — N401 Enlarged prostate with lower urinary tract symptoms: Secondary | ICD-10-CM

## 2021-06-18 DIAGNOSIS — N138 Other obstructive and reflux uropathy: Secondary | ICD-10-CM | POA: Diagnosis not present

## 2021-06-18 DIAGNOSIS — R310 Gross hematuria: Secondary | ICD-10-CM

## 2021-06-18 LAB — BLADDER SCAN AMB NON-IMAGING: Scan Result: 16

## 2021-06-18 MED ORDER — SILDENAFIL CITRATE 20 MG PO TABS: 20.0000 mg | ORAL_TABLET | ORAL | 6 refills | Status: DC | PRN

## 2021-09-09 ENCOUNTER — Encounter: Payer: Self-pay | Admitting: Family Medicine

## 2021-09-09 ENCOUNTER — Ambulatory Visit (INDEPENDENT_AMBULATORY_CARE_PROVIDER_SITE_OTHER): Payer: 59 | Admitting: Family Medicine

## 2021-09-09 ENCOUNTER — Other Ambulatory Visit: Payer: Self-pay

## 2021-09-09 VITALS — BP 139/70 | HR 75 | Temp 98.1°F | Ht 68.0 in | Wt 231.5 lb

## 2021-09-09 DIAGNOSIS — E119 Type 2 diabetes mellitus without complications: Secondary | ICD-10-CM | POA: Diagnosis not present

## 2021-09-09 DIAGNOSIS — E1169 Type 2 diabetes mellitus with other specified complication: Secondary | ICD-10-CM

## 2021-09-09 DIAGNOSIS — E785 Hyperlipidemia, unspecified: Secondary | ICD-10-CM

## 2021-09-09 DIAGNOSIS — I10 Essential (primary) hypertension: Secondary | ICD-10-CM | POA: Diagnosis not present

## 2021-09-09 LAB — COMPREHENSIVE METABOLIC PANEL
ALT: 41 U/L (ref 0–53)
AST: 30 U/L (ref 0–37)
Albumin: 4.6 g/dL (ref 3.5–5.2)
Alkaline Phosphatase: 81 U/L (ref 39–117)
BUN: 17 mg/dL (ref 6–23)
CO2: 31 mEq/L (ref 19–32)
Calcium: 9.8 mg/dL (ref 8.4–10.5)
Chloride: 100 mEq/L (ref 96–112)
Creatinine, Ser: 0.96 mg/dL (ref 0.40–1.50)
GFR: 81.85 mL/min (ref >60.00)
Glucose, Bld: 144 mg/dL — ABNORMAL HIGH (ref 70–99)
Potassium: 3.8 mEq/L (ref 3.5–5.1)
Sodium: 138 mEq/L (ref 135–145)
Total Bilirubin: 2 mg/dL — ABNORMAL HIGH (ref 0.2–1.2)
Total Protein: 7.4 g/dL (ref 6.0–8.3)

## 2021-09-09 LAB — HEMOGLOBIN A1C: Hgb A1c MFr Bld: 7.7 % — ABNORMAL HIGH (ref 4.6–6.5)

## 2021-09-09 LAB — LIPID PANEL
Cholesterol: 112 mg/dL (ref 0–200)
HDL: 35.3 mg/dL — ABNORMAL LOW (ref >39.00)
LDL Cholesterol: 60 mg/dL (ref 0–99)
NonHDL: 76.21
Total CHOL/HDL Ratio: 3
Triglycerides: 82 mg/dL (ref 0.0–149.0)
VLDL: 16.4 mg/dL (ref 0.0–40.0)

## 2021-09-09 NOTE — Progress Notes (Signed)
Subjective:    Patient ID: Daniel Lee, male    DOB: [birth year], 66 y.o.   MRN: [id number]  This visit occurred during the SARS-CoV-2 public health emergency.  Safety protocols were in place, including screening questions prior to the visit, additional usage of staff PPE, and extensive cleaning of exam room while observing appropriate contact time as indicated for disinfecting solutions.   HPI Pt presents for f/u of DM and HTN / chronic medical problems   Wt Readings from Last 3 Encounters:  09/09/21 231 lb 8 oz (105 kg)  06/18/21 227 lb (103 kg)  03/11/21 227 lb (103 kg)   35.20 kg/m  He has been working   [place] care of himself  Walking a whole lot more / at work up to 12,000 steps per day    HTN bp is stable today  No cp or palpitations or headaches or edema  No side effects to medicines  BP Readings from Last 3 Encounters:  09/09/21 139/70  06/18/21 (!) 180/93  03/11/21 134/78    Pulse Readings from Last 3 Encounters:  09/09/21 75  06/18/21 80  03/11/21 74   Losartan hct 100-25 mg daily  Amlodipine 5 mg daily   DM2 Lab Results  Component Value Date   HGBA1C 6.8 (H) 02/21/2021   Metformin 500 mg bid and diet  Takes statin and arb  He eats more salads now at [employer] (grilled chicken) for lunch  Seldom eats the fried stuff Loves salad   Some sweets - about 3 times per week  Not a lot of bread /occ sandwich Occ pasta   Occ low sugar OJ  Puts art sweetener in tea   Eye exam -July   up to date    Hyperlipidemia Lab Results  Component Value Date   CHOL 93 02/21/2021   HDL 34.10 (L) 02/21/2021   LDLCALC 39 02/21/2021   LDLDIRECT 99.9 06/23/2011   TRIG 99.0 02/21/2021   CHOLHDL 3 02/21/2021   Atorvastatin 10 mg daily  Occ fried foods   Patient Active Problem List   Diagnosis Date Noted   Ganglion cyst of right foot 05/24/2020   Routine general medical examination at a health care facility 06/22/2011   ESSENTIAL HYPERTENSION, BENIGN  04/25/2009   Hyperlipidemia associated with type 2 diabetes mellitus ([place]) 07/19/2008   Diabetes type 2, controlled ([place]) 04/16/2008   Low HDL (under 40) 03/18/2007   ERECTILE DYSFUNCTION 10/07/2006   PROSTATE SPECIFIC ANTIGEN, ELEVATED 10/07/2006   BPH (benign prostatic hyperplasia) 10/07/2006   Past Medical History:  Diagnosis Date   BPH (benign prostatic hypertrophy)    Diabetes mellitus without complication ([place])    Diabetes type 2, controlled ([place]) 04/16/2008   Qualifier: Diagnosis of  By: Glori Bickers MD, Carmell Austria    ED (erectile dysfunction)    Elevated alanine aminotransferase (ALT) level    suspect fatty liver   Hyperglycemia    Hypertension    Past Surgical History:  Procedure Laterality Date   abd ultrasound  11/2005   negative   COLONOSCOPY     HOLEP-LASER ENUCLEATION OF THE PROSTATE WITH MORCELLATION N/A 12/28/2017   Procedure: HOLEP-LASER ENUCLEATION OF THE PROSTATE WITH MORCELLATION;  Surgeon: Hollice Espy, MD;  Location: [facility] ORS;  Service: Urology;  Laterality: N/A;   PROSTATE SURGERY  2003   prostate biopsy negative // urologist   Social History   Tobacco Use   Smoking status: Former    Packs/day: 0.25    Types: Cigarettes  Quit date: 07/13/2001    Years since quitting: 20.1   Smokeless tobacco: Former    Types: Chew    Quit date: 07/13/2001   Tobacco comments:    smoked a cigarette every couple of days  Substance Use Topics   Alcohol use: No    Alcohol/week: 0.0 standard drinks   Drug use: No   Family History  Problem Relation Age of Onset   Cancer Father        prostate cancer    Diabetes Father    Stroke Father    Prostate cancer Father    Hypertension Mother    Hyperlipidemia Mother    Bladder Cancer Mother    Heart disease Brother    Diabetes Brother    Kidney disease Brother    Cancer Paternal Uncle        prostate cancer   Kidney cancer Neg Hx    No Known Allergies Current Outpatient Medications on File Prior to Visit  Medication  Sig Dispense Refill   ACCU-CHEK AVIVA PLUS test strip USE TO CHECK BLOOD SUGAR ONCE DAILY AND AS NEEDED (DX. E11.9) 100 strip 2   amLODipine (NORVASC) 5 MG tablet Take 1 tablet (5 mg total) by mouth daily. 90 tablet 3   atorvastatin (LIPITOR) 10 MG tablet Take 1 tablet (10 mg total) by mouth daily. 90 tablet 3   dutasteride (AVODART) 0.5 MG capsule TAKE 1 CAPSULE BY MOUTH EVERY DAY 90 capsule 3   Lancets (ACCU-CHEK MULTICLIX) lancets Use as instructed 100 each 12   losartan-hydrochlorothiazide (HYZAAR) 100-25 MG tablet Take 1 tablet by mouth daily. 90 tablet 3   metFORMIN (GLUCOPHAGE) 500 MG tablet TAKE 1 TABLET BY MOUTH TWICE A DAY WITH MEALS 180 tablet 3   sildenafil (REVATIO) 20 MG tablet Take 1 tablet (20 mg total) by mouth as needed. Take 1-5 tabs as needed prior to intercourse 30 tablet 6   No current facility-administered medications on file prior to visit.      Review of Systems  Constitutional:  Negative for activity change, appetite change, fatigue, fever and unexpected weight change.  HENT:  Negative for congestion, rhinorrhea, sore throat and trouble swallowing.   Eyes:  Negative for pain, redness, itching and visual disturbance.  Respiratory:  Negative for cough, chest tightness, shortness of breath and wheezing.   Cardiovascular:  Negative for chest pain and palpitations.  Gastrointestinal:  Negative for abdominal pain, blood in stool, constipation, diarrhea and nausea.  Endocrine: Negative for cold intolerance, heat intolerance, polydipsia and polyuria.  Genitourinary:  Negative for difficulty urinating, dysuria, frequency and urgency.  Musculoskeletal:  Negative for arthralgias, joint swelling and myalgias.  Skin:  Negative for pallor and rash.  Neurological:  Negative for dizziness, tremors, weakness, numbness and headaches.  Hematological:  Negative for adenopathy. Does not bruise/bleed easily.  Psychiatric/Behavioral:  Negative for decreased concentration and dysphoric  mood. The patient is not nervous/anxious.       Objective:   Physical Exam Constitutional:      General: He is not in acute distress.    Appearance: Normal appearance. He is obese. He is not ill-appearing.  HENT:     Head: Normocephalic and atraumatic.  Eyes:     General: No scleral icterus.    Conjunctiva/sclera: Conjunctivae normal.     Pupils: Pupils are equal, round, and reactive to light.  Neck:     Vascular: No carotid bruit.  Cardiovascular:     Rate and Rhythm: Normal rate and regular rhythm.  Pulses: Normal pulses.     Heart sounds: Normal heart sounds.  Musculoskeletal:     Cervical back: Normal range of motion and neck supple.     Right lower leg: No edema.     Left lower leg: No edema.  Lymphadenopathy:     Cervical: No cervical adenopathy.  Neurological:     Mental Status: He is alert.     Sensory: No sensory deficit.  Psychiatric:        Mood and Affect: Mood normal.          Assessment & Plan:   Problem List Items Addressed This Visit       Cardiovascular and Mediastinum   ESSENTIAL HYPERTENSION, BENIGN   Relevant Orders   Comprehensive metabolic panel (Completed)     Endocrine   Diabetes type 2, controlled ([place]) - Primary    a1c ordered  Eating a little better but has lost wt Enc to back off of the sweets as much as possible and keep walking  Taking metformin 500 mg bid  Also statin and arb  Eye exam utd Nl food exam       Relevant Orders   Hemoglobin A1c (Completed)   Hyperlipidemia associated with type 2 diabetes mellitus ([place])    Disc goals for lipids and reasons to control them Rev last labs with pt Rev low sat fat diet in detail Lab ordered  occ eats fried foods No missed doses of atorvastatin 10 mg daily      Relevant Orders   Lipid panel (Completed)

## 2021-09-09 NOTE — Patient Instructions (Addendum)
For diabetes   Try to get most of your carbohydrates from produce (with the exception of white potatoes)  Eat less bread/pasta/rice/snack foods/cereals/sweets and other items from the middle of the grocery store (processed carbs)  Cut the sweets   Keep walking   Labs today   Bivalent covid vaccine is a good idea

## 2021-09-09 NOTE — Assessment & Plan Note (Signed)
Disc goals for lipids and reasons to control them Rev last labs with pt Rev low sat fat diet in detail Lab ordered  occ eats fried foods No missed doses of atorvastatin 10 mg daily

## 2021-09-09 NOTE — Assessment & Plan Note (Signed)
a1c ordered  Eating a little better but has lost wt Enc to back off of the sweets as much as possible and keep walking  Taking metformin 500 mg bid  Also statin and arb  Eye exam utd Nl food exam

## 2021-09-20 ENCOUNTER — Other Ambulatory Visit: Payer: Self-pay | Admitting: Family Medicine

## 2021-12-19 ENCOUNTER — Ambulatory Visit (INDEPENDENT_AMBULATORY_CARE_PROVIDER_SITE_OTHER): Payer: 59 | Admitting: Family Medicine

## 2021-12-19 ENCOUNTER — Encounter: Payer: Self-pay | Admitting: Family Medicine

## 2021-12-19 VITALS — BP 126/72 | HR 86 | Ht 68.0 in | Wt 228.6 lb

## 2021-12-19 DIAGNOSIS — I10 Essential (primary) hypertension: Secondary | ICD-10-CM

## 2021-12-19 DIAGNOSIS — E119 Type 2 diabetes mellitus without complications: Secondary | ICD-10-CM

## 2021-12-19 LAB — POCT GLYCOSYLATED HEMOGLOBIN (HGB A1C): Hemoglobin A1C: 6.8 % — AB (ref 4.0–5.6)

## 2021-12-19 NOTE — Patient Instructions (Addendum)
Try to get most of your carbohydrates from produce (with the exception of white potatoes)  Eat less bread/pasta/rice/snack foods/cereals/sweets and other items from the middle of the grocery store (processed carbs)  Drink more water than diet drinks   A1c is improved  We have the option to increase metformin to 1000 mg twice daily as needed   Aim for 30 extra minutes of exercise per day   Schedule physical after sept 9

## 2021-12-19 NOTE — Assessment & Plan Note (Signed)
bp in fair control at this time  BP Readings from Last 1 Encounters:  12/19/21 126/72   No changes needed Most recent labs reviewed  Disc lifstyle change with low sodium diet and exercise  Better habits commended  Plan to continue  Losartan hct 10-25 mg daily  Amlodipine 5 mg daily

## 2021-12-19 NOTE — Progress Notes (Signed)
Subjective:    Patient ID: Daniel Lee, male    DOB: 1956/02/02, 66 y.o.   MRN: [id number]  HPI Pt presents for f/u of DM2  Wt Readings from Last 3 Encounters:  12/19/21 228 lb 9.6 oz (103.7 kg)  09/09/21 231 lb 8 oz (105 kg)  06/18/21 227 lb (103 kg)   34.76 kg/m   BP Readings from Last 3 Encounters:  12/19/21 126/72  09/09/21 139/70  06/18/21 (!) 180/93     DM2 Lab Results  Component Value Date   HGBA1C 7.7 (H) 09/09/2021   Metformin 500 mg bid  Occ takes 2 of them  Tolerates it well    Glucose at home  This week 120s 130s this week in am  As high as 165 prior to that   Does not tend to check later in day  Few times he does- around 100   Diet is a work in process  Cut out all of his sugar most of the time   Some diet soda  Drinks water too  Diet tea as well   Some sandwich bread -but not a lot  If rice / brown - not much at all  Occ pasta   Walking daily for exercise  Some incline   Statin and arb  Today Lab Results  Component Value Date   HGBA1C 6.8 (A) 12/19/2021   Improved !   Lab Results  Component Value Date   LABMICR See below: 10/18/2020   LABMICR See below: 12/19/2019   MICROALBUR 1.0 06/23/2010       Lab Results  Component Value Date   CREATININE 0.96 09/09/2021   BUN 17 09/09/2021   NA 138 09/09/2021   K 3.8 09/09/2021   CL 100 09/09/2021   CO2 31 09/09/2021   Patient Active Problem List   Diagnosis Date Noted   Ganglion cyst of right foot 05/24/2020   Routine general medical examination at a health care facility 06/22/2011   ESSENTIAL HYPERTENSION, BENIGN 04/25/2009   Hyperlipidemia associated with type 2 diabetes mellitus ([place]) 07/19/2008   Diabetes type 2, controlled ([place]) 04/16/2008   Low HDL (under 40) 03/18/2007   ERECTILE DYSFUNCTION 10/07/2006   PROSTATE SPECIFIC ANTIGEN, ELEVATED 10/07/2006   BPH (benign prostatic hyperplasia) 10/07/2006   Past Medical History:  Diagnosis Date   BPH (benign  prostatic hypertrophy)    Diabetes mellitus without complication ([place])    Diabetes type 2, controlled ([place]) 04/16/2008   Qualifier: Diagnosis of  By: Glori Bickers MD, Carmell Austria    ED (erectile dysfunction)    Elevated alanine aminotransferase (ALT) level    suspect fatty liver   Hyperglycemia    Hypertension    Past Surgical History:  Procedure Laterality Date   abd ultrasound  11/2005   negative   COLONOSCOPY     HOLEP-LASER ENUCLEATION OF THE PROSTATE WITH MORCELLATION N/A 12/28/2017   Procedure: HOLEP-LASER ENUCLEATION OF THE PROSTATE WITH MORCELLATION;  Surgeon: Hollice Espy, MD;  Location: [facility] ORS;  Service: Urology;  Laterality: N/A;   PROSTATE SURGERY  2003   prostate biopsy negative // urologist   Social History   Tobacco Use   Smoking status: Former    Packs/day: 0.25    Types: Cigarettes    Quit date: 07/13/2001    Years since quitting: 20.4   Smokeless tobacco: Former    Types: Chew    Quit date: 07/13/2001   Tobacco comments:    smoked a cigarette every couple of days  Substance Use Topics   Alcohol use: No    Alcohol/week: 0.0 standard drinks of alcohol   Drug use: No   Family History  Problem Relation Age of Onset   Cancer Father        prostate cancer    Diabetes Father    Stroke Father    Prostate cancer Father    Hypertension Mother    Hyperlipidemia Mother    Bladder Cancer Mother    Heart disease Brother    Diabetes Brother    Kidney disease Brother    Cancer Paternal Uncle        prostate cancer   Kidney cancer Neg Hx    No Known Allergies Current Outpatient Medications on File Prior to Visit  Medication Sig Dispense Refill   ACCU-CHEK AVIVA PLUS test strip USE TO CHECK BLOOD SUGAR ONCE DAILY AND AS NEEDED (DX. E11.9) 100 strip 2   amLODipine (NORVASC) 5 MG tablet Take 1 tablet (5 mg total) by mouth daily. 90 tablet 3   atorvastatin (LIPITOR) 10 MG tablet Take 1 tablet (10 mg total) by mouth daily. 90 tablet 3   dutasteride (AVODART) 0.5 MG  capsule TAKE 1 CAPSULE BY MOUTH EVERY DAY 90 capsule 3   Lancets (ACCU-CHEK MULTICLIX) lancets Use as instructed 100 each 12   losartan-hydrochlorothiazide (HYZAAR) 100-25 MG tablet Take 1 tablet by mouth daily. 90 tablet 3   metFORMIN (GLUCOPHAGE) 500 MG tablet TAKE 1 TABLET BY MOUTH TWICE A DAY WITH MEALS 180 tablet 3   sildenafil (REVATIO) 20 MG tablet Take 1 tablet (20 mg total) by mouth as needed. Take 1-5 tabs as needed prior to intercourse 30 tablet 6   No current facility-administered medications on file prior to visit.     Review of Systems  Constitutional:  Negative for activity change, appetite change, fatigue, fever and unexpected weight change.  HENT:  Negative for congestion, rhinorrhea, sore throat and trouble swallowing.   Eyes:  Negative for pain, redness, itching and visual disturbance.  Respiratory:  Negative for cough, chest tightness, shortness of breath and wheezing.   Cardiovascular:  Negative for chest pain and palpitations.  Gastrointestinal:  Negative for abdominal pain, blood in stool, constipation, diarrhea and nausea.  Endocrine: Negative for cold intolerance, heat intolerance, polydipsia and polyuria.  Genitourinary:  Negative for difficulty urinating, dysuria, frequency and urgency.  Musculoskeletal:  Negative for arthralgias, joint swelling and myalgias.  Skin:  Negative for pallor and rash.  Neurological:  Negative for dizziness, tremors, weakness, numbness and headaches.  Hematological:  Negative for adenopathy. Does not bruise/bleed easily.  Psychiatric/Behavioral:  Negative for decreased concentration and dysphoric mood. The patient is not nervous/anxious.        Objective:   Physical Exam Constitutional:      General: He is not in acute distress.    Appearance: Normal appearance. He is well-developed. He is obese.  HENT:     Head: Normocephalic and atraumatic.  Eyes:     Conjunctiva/sclera: Conjunctivae normal.     Pupils: Pupils are equal,  round, and reactive to light.  Neck:     Thyroid: No thyromegaly.     Vascular: No carotid bruit or JVD.  Cardiovascular:     Rate and Rhythm: Normal rate and regular rhythm.     Heart sounds: Normal heart sounds.     No gallop.  Pulmonary:     Effort: Pulmonary effort is normal. No respiratory distress.     Breath sounds: Normal breath sounds. No  wheezing or rales.  Abdominal:     General: There is no distension or abdominal bruit.     Palpations: Abdomen is soft.  Musculoskeletal:     Cervical back: Normal range of motion and neck supple.     Right lower leg: No edema.     Left lower leg: No edema.  Lymphadenopathy:     Cervical: No cervical adenopathy.  Skin:    General: Skin is warm and dry.     Coloration: Skin is not pale.     Findings: No rash.  Neurological:     Mental Status: He is alert.     Coordination: Coordination normal.     Deep Tendon Reflexes: Reflexes are normal and symmetric. Reflexes normal.  Psychiatric:        Mood and Affect: Mood normal.           Assessment & Plan:   Problem List Items Addressed This Visit       Cardiovascular and Mediastinum   ESSENTIAL HYPERTENSION, BENIGN    bp in fair control at this time  BP Readings from Last 1 Encounters:  12/19/21 126/72  No changes needed Most recent labs reviewed  Disc lifstyle change with low sodium diet and exercise  Better habits commended  Plan to continue  Losartan hct 10-25 mg daily  Amlodipine 5 mg daily         Endocrine   Diabetes type 2, controlled ([place]) - Primary    Improved Lab Results  Component Value Date   HGBA1C 6.8 (A) 12/19/2021    down from 7.7 with diet change  Enc low glycemic diet and more exercise with goal of wt loss  Will try and drink more water On arb and statin  Eye care utd Nl foot exam  F/u in 3 mo for annual exam      Relevant Orders   POCT glycosylated hemoglobin (Hb A1C) (Completed)

## 2021-12-19 NOTE — Assessment & Plan Note (Signed)
Improved Lab Results  Component Value Date   HGBA1C 6.8 (A) 12/19/2021    down from 7.7 with diet change  Enc low glycemic diet and more exercise with goal of wt loss  Will try and drink more water On arb and statin  Eye care utd Nl foot exam  F/u in 3 mo for annual exam

## 2022-03-04 LAB — HM DIABETES EYE EXAM

## 2022-03-16 ENCOUNTER — Telehealth: Payer: Self-pay | Admitting: Family Medicine

## 2022-03-16 DIAGNOSIS — E786 Lipoprotein deficiency: Secondary | ICD-10-CM

## 2022-03-16 DIAGNOSIS — N401 Enlarged prostate with lower urinary tract symptoms: Secondary | ICD-10-CM

## 2022-03-16 DIAGNOSIS — I10 Essential (primary) hypertension: Secondary | ICD-10-CM

## 2022-03-16 DIAGNOSIS — E1169 Type 2 diabetes mellitus with other specified complication: Secondary | ICD-10-CM

## 2022-03-16 DIAGNOSIS — E119 Type 2 diabetes mellitus without complications: Secondary | ICD-10-CM

## 2022-03-16 NOTE — Telephone Encounter (Signed)
-----   Message from Ellamae Sia sent at 03/03/2022  3:50 PM EDT ----- Regarding: Lab orders for Tuesday, 9.5.23 Patient is scheduled for CPX labs, please order future labs, Thanks , Karna Christmas

## 2022-03-17 ENCOUNTER — Other Ambulatory Visit (INDEPENDENT_AMBULATORY_CARE_PROVIDER_SITE_OTHER): Payer: 59

## 2022-03-17 DIAGNOSIS — E1169 Type 2 diabetes mellitus with other specified complication: Secondary | ICD-10-CM | POA: Diagnosis not present

## 2022-03-17 DIAGNOSIS — E786 Lipoprotein deficiency: Secondary | ICD-10-CM

## 2022-03-17 DIAGNOSIS — E785 Hyperlipidemia, unspecified: Secondary | ICD-10-CM

## 2022-03-17 DIAGNOSIS — I10 Essential (primary) hypertension: Secondary | ICD-10-CM | POA: Diagnosis not present

## 2022-03-17 DIAGNOSIS — E119 Type 2 diabetes mellitus without complications: Secondary | ICD-10-CM | POA: Diagnosis not present

## 2022-03-17 LAB — COMPREHENSIVE METABOLIC PANEL
ALT: 38 U/L (ref 0–53)
AST: 26 U/L (ref 0–37)
Albumin: 4.2 g/dL (ref 3.5–5.2)
Alkaline Phosphatase: 77 U/L (ref 39–117)
BUN: 18 mg/dL (ref 6–23)
CO2: 27 mEq/L (ref 19–32)
Calcium: 9.3 mg/dL (ref 8.4–10.5)
Chloride: 104 mEq/L (ref 96–112)
Creatinine, Ser: 0.87 mg/dL (ref 0.40–1.50)
GFR: 89.02 mL/min (ref >60.00)
Glucose, Bld: 133 mg/dL — ABNORMAL HIGH (ref 70–99)
Potassium: 4 mEq/L (ref 3.5–5.1)
Sodium: 141 mEq/L (ref 135–145)
Total Bilirubin: 1.2 mg/dL (ref 0.2–1.2)
Total Protein: 6.8 g/dL (ref 6.0–8.3)

## 2022-03-17 LAB — HEMOGLOBIN A1C: Hgb A1c MFr Bld: 7.2 % — ABNORMAL HIGH (ref 4.6–6.5)

## 2022-03-17 LAB — CBC WITH DIFFERENTIAL/PLATELET
Basophils Absolute: 0 10*3/uL (ref 0.0–0.1)
Basophils Relative: 0.8 % (ref 0.0–3.0)
Eosinophils Absolute: 0 10*3/uL (ref 0.0–0.7)
Eosinophils Relative: 0.8 % (ref 0.0–5.0)
HCT: 40.4 % (ref 39.0–52.0)
Hemoglobin: 13.5 g/dL (ref 13.0–17.0)
Lymphocytes Relative: 33.4 % (ref 12.0–46.0)
Lymphs Abs: 1.8 10*3/uL (ref 0.7–4.0)
MCHC: 33.4 g/dL (ref 30.0–36.0)
MCV: 88.1 fl (ref 78.0–100.0)
Monocytes Absolute: 0.6 10*3/uL (ref 0.1–1.0)
Monocytes Relative: 10.7 % (ref 3.0–12.0)
Neutro Abs: 3 10*3/uL (ref 1.4–7.7)
Neutrophils Relative %: 54.3 % (ref 43.0–77.0)
Platelets: 167 10*3/uL (ref 150.0–400.0)
RBC: 4.59 Mil/uL (ref 4.22–5.81)
RDW: 14 % (ref 11.5–15.5)
WBC: 5.5 10*3/uL (ref 4.0–10.5)

## 2022-03-17 LAB — LIPID PANEL
Cholesterol: 101 mg/dL (ref 0–200)
HDL: 34.6 mg/dL — ABNORMAL LOW (ref >39.00)
LDL Cholesterol: 44 mg/dL (ref 0–99)
NonHDL: 66.22
Total CHOL/HDL Ratio: 3
Triglycerides: 110 mg/dL (ref 0.0–149.0)
VLDL: 22 mg/dL (ref 0.0–40.0)

## 2022-03-17 LAB — MICROALBUMIN / CREATININE URINE RATIO
Creatinine,U: 110.7 mg/dL
Microalb Creat Ratio: 20.9 mg/g (ref 0.0–30.0)
Microalb, Ur: 23.2 mg/dL — ABNORMAL HIGH (ref 0.0–1.9)

## 2022-03-17 LAB — TSH: TSH: 2.51 u[IU]/mL (ref 0.35–5.50)

## 2022-03-23 ENCOUNTER — Ambulatory Visit (INDEPENDENT_AMBULATORY_CARE_PROVIDER_SITE_OTHER): Payer: 59 | Admitting: Family Medicine

## 2022-03-23 ENCOUNTER — Encounter: Payer: Self-pay | Admitting: Family Medicine

## 2022-03-23 VITALS — BP 132/72 | HR 71 | Temp 97.7°F | Ht 68.0 in | Wt 229.1 lb

## 2022-03-23 DIAGNOSIS — E785 Hyperlipidemia, unspecified: Secondary | ICD-10-CM

## 2022-03-23 DIAGNOSIS — Z0000 Encounter for general adult medical examination without abnormal findings: Secondary | ICD-10-CM | POA: Diagnosis not present

## 2022-03-23 DIAGNOSIS — N401 Enlarged prostate with lower urinary tract symptoms: Secondary | ICD-10-CM

## 2022-03-23 DIAGNOSIS — I10 Essential (primary) hypertension: Secondary | ICD-10-CM | POA: Diagnosis not present

## 2022-03-23 DIAGNOSIS — Z23 Encounter for immunization: Secondary | ICD-10-CM

## 2022-03-23 DIAGNOSIS — E1169 Type 2 diabetes mellitus with other specified complication: Secondary | ICD-10-CM

## 2022-03-23 DIAGNOSIS — E119 Type 2 diabetes mellitus without complications: Secondary | ICD-10-CM

## 2022-03-23 DIAGNOSIS — Z1211 Encounter for screening for malignant neoplasm of colon: Secondary | ICD-10-CM

## 2022-03-23 MED ORDER — AMLODIPINE BESYLATE 5 MG PO TABS: 5.0000 mg | ORAL_TABLET | Freq: Every day | ORAL | 3 refills | Status: DC

## 2022-03-23 MED ORDER — METFORMIN HCL 1000 MG PO TABS: 1000.0000 mg | ORAL_TABLET | Freq: Two times a day (BID) | ORAL | 3 refills | Status: DC

## 2022-03-23 MED ORDER — ATORVASTATIN CALCIUM 10 MG PO TABS: 10.0000 mg | ORAL_TABLET | Freq: Every day | ORAL | 3 refills | Status: DC

## 2022-03-23 NOTE — Assessment & Plan Note (Signed)
Lab Results  Component Value Date   HGBA1C 7.2 (H) 03/17/2022   This is up  More sweets-plans to stop Enc more wt bearing exercise  Sent for eye exam report from last mo Inc metformin to 1000 mg bid  On arb and statin  F/u 3 mo

## 2022-03-23 NOTE — Patient Instructions (Addendum)
Let's do flu shot today Get your shingrix at the pharmacy in a month or so   Call to schedule your colonoscopy in [place] -it is due in November   [facility]  [phone]  For cholesterol Exercise as much as you can to raise HDL cholesterol  For diabetes Lab Results  Component Value Date   HGBA1C 7.2 (H) 03/17/2022   Go up on your metformin to 1000 mg twice daily  If side effects let us know  Try to get most of your carbohydrates from produce (with the exception of white potatoes)  Eat less bread/pasta/rice/snack foods/cereals/sweets and other items from the middle of the grocery store (processed carbs)  Follow up in 3 months

## 2022-03-23 NOTE — Assessment & Plan Note (Signed)
Due for screening colonoscopy   Referral done Prefers [facility]

## 2022-03-23 NOTE — Progress Notes (Signed)
Subjective:    Patient ID: Daniel Lee, male    DOB: 22-Aug-1955, 66 y.o.   MRN: [id number]  HPI Here for health maintenance exam and to review chronic medical problems    Wt Readings from Last 3 Encounters:  03/23/22 229 lb 2 oz (103.9 kg)  12/19/21 228 lb 9.6 oz (103.7 kg)  09/09/21 231 lb 8 oz (105 kg)   34.84 kg/m   Has been feeling ok  Knows a1c is up - ? If eating differently  Perhaps some sweets   Immunization History  Administered Date(s) Administered   Fluad Quad(high Dose 65+) 04/07/2020, 03/11/2021   Influenza Split 04/27/2011   Influenza Whole 04/20/2006, 04/16/2008, 04/25/2009, 04/18/2010   Influenza, Quadrivalent, Recombinant, Inj, Pf 04/01/2018   Influenza, Seasonal, Injecte, Preservative Fre 05/31/2015   Influenza,inj,Quad PF,6+ Mos 04/26/2013, 04/10/2016, 04/01/2018, 03/31/2019   Influenza-Unspecified 04/22/2012, 04/14/2014, 04/04/2017, 04/19/2020   PFIZER(Purple Top)SARS-COV-2 Vaccination 08/26/2019, 09/16/2019, 04/19/2020   Pneumococcal Conjugate-13 02/23/2020   Td 12/15/2005   Tdap 01/26/2017   Zoster Recombinat (Shingrix) 12/26/2021   Zoster, Live 05/31/2015   Health Maintenance Due  Topic Date Due   Hepatitis C Screening  Never done   COVID-19 Vaccine (4 - Pfizer series) 06/14/2020   Pneumonia Vaccine 26+ Years old (2 - PPSV23 or PCV20) 02/22/2021   OPHTHALMOLOGY EXAM  01/30/2022   INFLUENZA VACCINE  02/10/2022   Zoster Vaccines- Shingrix (2 of 2) 02/20/2022   Pt had prevnar 13 in 2021  Flu shot- today   Shingrix-had first one 6/16   Colonoscopy 05/2012  He wants to get it in [place]  Prostate health Under care of urology for BPH Takex avodart Nocturia is 2 times average  His father had prostate cancer  Lab Results  Component Value Date   PSA1 2.3 06/16/2021   PSA1 2.5 06/14/2020   PSA1 4.8 (H) 12/19/2019   PSA 1.96 02/16/2020   PSA 12.06 (H) 01/22/2017   PSA 10.86 (H) 06/28/2015  Had neg prostate bx years ago and  laser tx  Per pt some prostate has "grown back" but not planning more surgery   Takes sildenafil for ED    HTN bp is stable today  No cp or palpitations or headaches or edema  No side effects to medicines  BP Readings from Last 3 Encounters:  03/23/22 132/72  12/19/21 126/72  09/09/21 139/70     Losartan hct 10-25 mg daily  Amlodipine 5 mg daily  Lab Results  Component Value Date   CREATININE 0.87 03/17/2022   BUN 18 03/17/2022   NA 141 03/17/2022   K 4.0 03/17/2022   CL 104 03/17/2022   CO2 27 03/17/2022   DM2 Lab Results  Component Value Date   HGBA1C 7.2 (H) 03/17/2022   This is up from 6.8  Metformin 500 mg bid  Arb and statin  Eating some small cookies after dinner   Had eye exam in august - no retinopathy   Lab Results  Component Value Date   LABMICR See below: 10/18/2020   LABMICR See below: 12/19/2019   MICROALBUR 23.2 (H) 03/17/2022   MICROALBUR 1.0 06/23/2010    No sugar drinks / some diet sodas  Walking every day 8-10,000 steps per day  Got some light weights to walk with  Works long hours     Hyperlipidemia Lab Results  Component Value Date   CHOL 101 03/17/2022   CHOL 112 09/09/2021   CHOL 93 02/21/2021   Lab Results  Component Value Date  HDL 34.60 (L) 03/17/2022   HDL 35.30 (L) 09/09/2021   HDL 34.10 (L) 02/21/2021   Lab Results  Component Value Date   LDLCALC 44 03/17/2022   LDLCALC 60 09/09/2021   LDLCALC 39 02/21/2021   Lab Results  Component Value Date   TRIG 110.0 03/17/2022   TRIG 82.0 09/09/2021   TRIG 99.0 02/21/2021   Lab Results  Component Value Date   CHOLHDL 3 03/17/2022   CHOLHDL 3 09/09/2021   CHOLHDL 3 02/21/2021   Lab Results  Component Value Date   LDLDIRECT 99.9 06/23/2011   Atorvastatin 10 mg daily   Other labs  Lab Results  Component Value Date   CREATININE 0.87 03/17/2022   BUN 18 03/17/2022   NA 141 03/17/2022   K 4.0 03/17/2022   CL 104 03/17/2022   CO2 27 03/17/2022   Lab  Results  Component Value Date   WBC 5.5 03/17/2022   HGB 13.5 03/17/2022   HCT 40.4 03/17/2022   MCV 88.1 03/17/2022   PLT 167.0 03/17/2022   Lab Results  Component Value Date   TSH 2.51 03/17/2022    Patient Active Problem List   Diagnosis Date Noted   Colon cancer screening 03/23/2022   Ganglion cyst of right foot 05/24/2020   Routine general medical examination at a health care facility 06/22/2011   ESSENTIAL HYPERTENSION, BENIGN 04/25/2009   Hyperlipidemia associated with type 2 diabetes mellitus ([place]) 07/19/2008   Diabetes type 2, controlled ([place]) 04/16/2008   Low HDL (under 40) 03/18/2007   ERECTILE DYSFUNCTION 10/07/2006   PROSTATE SPECIFIC ANTIGEN, ELEVATED 10/07/2006   BPH (benign prostatic hyperplasia) 10/07/2006   Past Medical History:  Diagnosis Date   BPH (benign prostatic hypertrophy)    Diabetes mellitus without complication ([place])    Diabetes type 2, controlled ([place]) 04/16/2008   Qualifier: Diagnosis of  By: Glori Bickers MD, Carmell Austria    ED (erectile dysfunction)    Elevated alanine aminotransferase (ALT) level    suspect fatty liver   Hyperglycemia    Hypertension    Past Surgical History:  Procedure Laterality Date   abd ultrasound  11/2005   negative   COLONOSCOPY     HOLEP-LASER ENUCLEATION OF THE PROSTATE WITH MORCELLATION N/A 12/28/2017   Procedure: HOLEP-LASER ENUCLEATION OF THE PROSTATE WITH MORCELLATION;  Surgeon: Hollice Espy, MD;  Location: [facility] ORS;  Service: Urology;  Laterality: N/A;   PROSTATE SURGERY  2003   prostate biopsy negative // urologist   Social History   Tobacco Use   Smoking status: Former    Packs/day: 0.25    Types: Cigarettes    Quit date: 07/13/2001    Years since quitting: 20.7   Smokeless tobacco: Former    Types: Chew    Quit date: 07/13/2001   Tobacco comments:    smoked a cigarette every couple of days  Substance Use Topics   Alcohol use: No    Alcohol/week: 0.0 standard drinks of alcohol   Drug use: No    Family History  Problem Relation Age of Onset   Cancer Father        prostate cancer    Diabetes Father    Stroke Father    Prostate cancer Father    Hypertension Mother    Hyperlipidemia Mother    Bladder Cancer Mother    Heart disease Brother    Diabetes Brother    Kidney disease Brother    Cancer Paternal Uncle        prostate cancer  Kidney cancer Neg Hx    No Known Allergies Current Outpatient Medications on File Prior to Visit  Medication Sig Dispense Refill   ACCU-CHEK AVIVA PLUS test strip USE TO CHECK BLOOD SUGAR ONCE DAILY AND AS NEEDED (DX. E11.9) 100 strip 2   dutasteride (AVODART) 0.5 MG capsule TAKE 1 CAPSULE BY MOUTH EVERY DAY 90 capsule 3   Lancets (ACCU-CHEK MULTICLIX) lancets Use as instructed 100 each 12   losartan-hydrochlorothiazide (HYZAAR) 100-25 MG tablet Take 1 tablet by mouth daily. 90 tablet 3   sildenafil (REVATIO) 20 MG tablet Take 1 tablet (20 mg total) by mouth as needed. Take 1-5 tabs as needed prior to intercourse 30 tablet 6   No current facility-administered medications on file prior to visit.    Review of Systems  Constitutional:  Negative for activity change, appetite change, fatigue, fever and unexpected weight change.  HENT:  Negative for congestion, rhinorrhea, sore throat and trouble swallowing.   Eyes:  Negative for pain, redness, itching and visual disturbance.  Respiratory:  Negative for cough, chest tightness, shortness of breath and wheezing.   Cardiovascular:  Negative for chest pain and palpitations.  Gastrointestinal:  Negative for abdominal pain, blood in stool, constipation, diarrhea and nausea.  Endocrine: Negative for cold intolerance, heat intolerance, polydipsia and polyuria.  Genitourinary:  Negative for difficulty urinating, dysuria, frequency and urgency.       Nocturia   Musculoskeletal:  Negative for arthralgias, joint swelling and myalgias.  Skin:  Negative for pallor and rash.  Neurological:  Negative for  dizziness, tremors, weakness, numbness and headaches.  Hematological:  Negative for adenopathy. Does not bruise/bleed easily.  Psychiatric/Behavioral:  Negative for decreased concentration and dysphoric mood. The patient is not nervous/anxious.        Objective:   Physical Exam Constitutional:      General: He is not in acute distress.    Appearance: Normal appearance. He is well-developed. He is obese. He is not ill-appearing or diaphoretic.  HENT:     Head: Normocephalic and atraumatic.     Right Ear: Tympanic membrane, ear canal and external ear normal.     Left Ear: Tympanic membrane, ear canal and external ear normal.     Nose: Nose normal. No congestion.     Mouth/Throat:     Mouth: Mucous membranes are moist.     Pharynx: Oropharynx is clear. No posterior oropharyngeal erythema.  Eyes:     General: No scleral icterus.       Right eye: No discharge.        Left eye: No discharge.     Conjunctiva/sclera: Conjunctivae normal.     Pupils: Pupils are equal, round, and reactive to light.  Neck:     Thyroid: No thyromegaly.     Vascular: No carotid bruit or JVD.  Cardiovascular:     Rate and Rhythm: Normal rate and regular rhythm.     Pulses: Normal pulses.     Heart sounds: Normal heart sounds.     No gallop.  Pulmonary:     Effort: Pulmonary effort is normal. No respiratory distress.     Breath sounds: Normal breath sounds. No wheezing or rales.     Comments: Good air exch Chest:     Chest wall: No tenderness.  Abdominal:     General: Bowel sounds are normal. There is no distension or abdominal bruit.     Palpations: Abdomen is soft. There is no mass.     Tenderness: There is no  abdominal tenderness.     Hernia: No hernia is present.  Musculoskeletal:        General: No tenderness.     Cervical back: Normal range of motion and neck supple. No rigidity. No muscular tenderness.     Right lower leg: No edema.     Left lower leg: No edema.  Lymphadenopathy:      Cervical: No cervical adenopathy.  Skin:    General: Skin is warm and dry.     Coloration: Skin is not pale.     Findings: No erythema or rash.     Comments: Tanned Solar lentigines diffusely  Many SKs on trunk    Neurological:     Mental Status: He is alert.     Cranial Nerves: No cranial nerve deficit.     Motor: No abnormal muscle tone.     Coordination: Coordination normal.     Gait: Gait normal.     Deep Tendon Reflexes: Reflexes are normal and symmetric. Reflexes normal.  Psychiatric:        Mood and Affect: Mood normal.        Cognition and Memory: Cognition and memory normal.           Assessment & Plan:   Problem List Items Addressed This Visit       Cardiovascular and Mediastinum   ESSENTIAL HYPERTENSION, BENIGN    bp in fair control at this time  BP Readings from Last 1 Encounters:  03/23/22 132/72  No changes needed Most recent labs reviewed  Disc lifstyle change with low sodium diet and exercise  Plan to continue Losartan hct 10-25 mg daily  Amlodipine 5 mg daily        Relevant Medications   atorvastatin (LIPITOR) 10 MG tablet   amLODipine (NORVASC) 5 MG tablet     Endocrine   Diabetes type 2, controlled ([place])    Lab Results  Component Value Date   HGBA1C 7.2 (H) 03/17/2022  This is up  More sweets-plans to stop Enc more wt bearing exercise  Sent for eye exam report from last mo Inc metformin to 1000 mg bid  On arb and statin  F/u 3 mo      Relevant Medications   metFORMIN (GLUCOPHAGE) 1000 MG tablet   atorvastatin (LIPITOR) 10 MG tablet   Hyperlipidemia associated with type 2 diabetes mellitus ([place])    Disc goals for lipids and reasons to control them Rev last labs with pt Rev low sat fat diet in detail  LDL is well controlled at 44 HDL too low-disc ways to inc this Plan to continue atorvastatin 10 mg daily       Relevant Medications   metFORMIN (GLUCOPHAGE) 1000 MG tablet   atorvastatin (LIPITOR) 10 MG tablet    amLODipine (NORVASC) 5 MG tablet     Genitourinary   BPH (benign prostatic hyperplasia)     Other   Colon cancer screening    Due for screening colonoscopy   Referral done Prefers [place]      Relevant Orders   Ambulatory referral to Gastroenterology   Routine general medical examination at a health care facility - Primary    Reviewed health habits including diet and exercise and skin cancer prevention Reviewed appropriate screening tests for age  Also reviewed health mt list, fam hx and immunization status , as well as social and family history   See HPI Labs reviewed  Flu shot today  Will do pna vaccine at next visit  Plans to get the shingrix (2nd) in about a month at pharmacy Colonoscopy is due- ref done for [facility] in [place] urology for prostate care       Relevant Orders   Flu Vaccine QUAD High Dose(Fluad) (Completed)   Other Visit Diagnoses     Need for influenza vaccination       Relevant Orders   Flu Vaccine QUAD High Dose(Fluad) (Completed)

## 2022-03-23 NOTE — Assessment & Plan Note (Signed)
Disc goals for lipids and reasons to control them Rev last labs with pt Rev low sat fat diet in detail  LDL is well controlled at 44 HDL too low-disc ways to inc this Plan to continue atorvastatin 10 mg daily

## 2022-03-23 NOTE — Assessment & Plan Note (Signed)
bp in fair control at this time  BP Readings from Last 1 Encounters:  03/23/22 132/72   No changes needed Most recent labs reviewed  Disc lifstyle change with low sodium diet and exercise  Plan to continue Losartan hct 10-25 mg daily  Amlodipine 5 mg daily

## 2022-03-23 NOTE — Assessment & Plan Note (Addendum)
Reviewed health habits including diet and exercise and skin cancer prevention Reviewed appropriate screening tests for age  Also reviewed health mt list, fam hx and immunization status , as well as social and family history   See HPI Labs reviewed  Flu shot today  Will do pna vaccine at next visit  Plans to get the shingrix (2nd) in about a month at pharmacy Colonoscopy is due- ref done for [facility] in [place] urology for prostate care

## 2022-03-25 ENCOUNTER — Telehealth: Payer: Self-pay

## 2022-03-25 ENCOUNTER — Other Ambulatory Visit: Payer: Self-pay

## 2022-03-25 DIAGNOSIS — Z1211 Encounter for screening for malignant neoplasm of colon: Secondary | ICD-10-CM

## 2022-03-25 MED ORDER — NA SULFATE-K SULFATE-MG SULF 17.5-3.13-1.6 GM/177ML PO SOLN: 354.0000 mL | Freq: Once | ORAL | 0 refills | Status: AC

## 2022-03-25 NOTE — Telephone Encounter (Signed)
Gastroenterology Pre-Procedure Review  Request Date: 05/28/2022 Requesting Physician: Dr. Vicente Males   PATIENT REVIEW QUESTIONS: The patient responded to the following health history questions as indicated:    1. Are you having any GI issues? no 2. Do you have a personal history of Polyps? Yes had 2 removed 10 years ago  3. Do you have a family history of Colon Cancer or Polyps? no 4. Diabetes Mellitus? Yes take Metmorin 5. Joint replacements in the past 12 months?no 6. Major health problems in the past 3 months?no 7. Any artificial heart valves, MVP, or defibrillator?no    MEDICATIONS & ALLERGIES:    Patient reports the following regarding taking any anticoagulation/antiplatelet therapy:   Plavix, Coumadin, Eliquis, Xarelto, Lovenox, Pradaxa, Brilinta, or Effient? no Aspirin? no  Patient confirms/reports the following medications:  Current Outpatient Medications  Medication Sig Dispense Refill   ACCU-CHEK AVIVA PLUS test strip USE TO CHECK BLOOD SUGAR ONCE DAILY AND AS NEEDED (DX. E11.9) 100 strip 2   amLODipine (NORVASC) 5 MG tablet Take 1 tablet (5 mg total) by mouth daily. 90 tablet 3   atorvastatin (LIPITOR) 10 MG tablet Take 1 tablet (10 mg total) by mouth daily. 90 tablet 3   dutasteride (AVODART) 0.5 MG capsule TAKE 1 CAPSULE BY MOUTH EVERY DAY 90 capsule 3   Lancets (ACCU-CHEK MULTICLIX) lancets Use as instructed 100 each 12   losartan-hydrochlorothiazide (HYZAAR) 100-25 MG tablet Take 1 tablet by mouth daily. 90 tablet 3   metFORMIN (GLUCOPHAGE) 1000 MG tablet Take 1 tablet (1,000 mg total) by mouth 2 (two) times daily with a meal. 180 tablet 3   sildenafil (REVATIO) 20 MG tablet Take 1 tablet (20 mg total) by mouth as needed. Take 1-5 tabs as needed prior to intercourse 30 tablet 6   No current facility-administered medications for this visit.    Patient confirms/reports the following allergies:  No Known Allergies  No orders of the defined types were placed in this  encounter.   AUTHORIZATION INFORMATION Primary Insurance: 1D#: Group #:  Secondary Insurance: 1D#: Group #:  SCHEDULE INFORMATION: Date:  Time: Location:

## 2022-04-17 ENCOUNTER — Encounter: Payer: Self-pay | Admitting: Family Medicine

## 2022-04-24 ENCOUNTER — Other Ambulatory Visit: Payer: Self-pay | Admitting: Family Medicine

## 2022-04-24 ENCOUNTER — Telehealth: Payer: Self-pay | Admitting: Family Medicine

## 2022-04-24 DIAGNOSIS — I10 Essential (primary) hypertension: Secondary | ICD-10-CM

## 2022-04-24 NOTE — Telephone Encounter (Signed)
Caller Name: [place]  Call back phone #: [phone]  MEDICATION(S):  losartan-hydrochlorothiazide (HYZAAR) 100-25 MG tablet  Days of Med Remaining:   Has the patient contacted their pharmacy (YES/NO)? YES What did pharmacy advise?  Pharmacy stated rx was expired & pcp would have to send another rx   Preferred Pharmacy:  [facility], [address]   ~~~Please advise patient/caregiver to allow 2-3 business days to process RX refills.

## 2022-04-24 NOTE — Telephone Encounter (Signed)
Rx was refilled today

## 2022-05-28 ENCOUNTER — Encounter: Payer: Self-pay | Admitting: Gastroenterology

## 2022-05-28 ENCOUNTER — Ambulatory Visit: Payer: 59 | Admitting: Certified Registered"

## 2022-05-28 ENCOUNTER — Encounter
Admission: RE | Disposition: A | Discharge status: Home / Self Care | Payer: Self-pay | Source: Home / Self Care | Attending: Gastroenterology

## 2022-05-28 ENCOUNTER — Ambulatory Visit
Admission: RE | Admit: 2022-05-28 | Discharge: 2022-05-28 | Disposition: A | Discharge status: Home / Self Care | Payer: 59 | Attending: Gastroenterology | Admitting: Gastroenterology

## 2022-05-28 DIAGNOSIS — E119 Type 2 diabetes mellitus without complications: Secondary | ICD-10-CM | POA: Diagnosis not present

## 2022-05-28 DIAGNOSIS — N400 Enlarged prostate without lower urinary tract symptoms: Secondary | ICD-10-CM | POA: Diagnosis not present

## 2022-05-28 DIAGNOSIS — I10 Essential (primary) hypertension: Secondary | ICD-10-CM | POA: Insufficient documentation

## 2022-05-28 DIAGNOSIS — N529 Male erectile dysfunction, unspecified: Secondary | ICD-10-CM | POA: Diagnosis not present

## 2022-05-28 DIAGNOSIS — Z87891 Personal history of nicotine dependence: Secondary | ICD-10-CM | POA: Insufficient documentation

## 2022-05-28 DIAGNOSIS — D124 Benign neoplasm of descending colon: Secondary | ICD-10-CM | POA: Diagnosis not present

## 2022-05-28 DIAGNOSIS — Z7984 Long term (current) use of oral hypoglycemic drugs: Secondary | ICD-10-CM | POA: Insufficient documentation

## 2022-05-28 DIAGNOSIS — D126 Benign neoplasm of colon, unspecified: Secondary | ICD-10-CM

## 2022-05-28 DIAGNOSIS — Z1211 Encounter for screening for malignant neoplasm of colon: Secondary | ICD-10-CM | POA: Diagnosis present

## 2022-05-28 HISTORY — PX: COLONOSCOPY WITH PROPOFOL: SHX5780

## 2022-05-28 LAB — GLUCOSE, CAPILLARY: Glucose-Capillary: 135 mg/dL — ABNORMAL HIGH (ref 70–99)

## 2022-05-28 SURGERY — COLONOSCOPY WITH PROPOFOL
Anesthesia: General

## 2022-05-28 MED ORDER — SODIUM CHLORIDE 0.9 % IV SOLN: INTRAVENOUS | Status: DC

## 2022-05-28 MED ORDER — LIDOCAINE HCL (CARDIAC) PF 100 MG/5ML IV SOSY
PREFILLED_SYRINGE | INTRAVENOUS | Status: DC | PRN
  Administered 2022-05-28: 50 mg via INTRAVENOUS

## 2022-05-28 MED ORDER — PROPOFOL 500 MG/50ML IV EMUL
INTRAVENOUS | Status: DC | PRN
  Administered 2022-05-28: 140 ug/kg/min via INTRAVENOUS

## 2022-05-28 MED ORDER — PROPOFOL 10 MG/ML IV BOLUS
INTRAVENOUS | Status: DC | PRN
  Administered 2022-05-28: 80 mg via INTRAVENOUS

## 2022-05-28 NOTE — Op Note (Signed)
[facility] Gastroenterology Patient Name: Daniel Lee Procedure Date: 05/28/2022 7:52 AM MRN: [id number] Account #: [id number] Date of Birth: 1956-02-09 Admit Type: Outpatient Age: 66 Room: [facility] ENDO ROOM 3 Gender: Male Note Status: Finalized Instrument Name: Jasper Riling [id number] Procedure:             Colonoscopy Indications:           Screening for colorectal malignant neoplasm Providers:             [name] MD, MD Referring MD:          No Local Md, MD (Referring MD) Medicines:             Monitored Anesthesia Care Complications:         No immediate complications. Procedure:             Pre-Anesthesia Assessment:                        - Prior to the procedure, a History and Physical was                         performed, and patient medications, allergies and                         sensitivities were reviewed. The patient's tolerance                         of previous anesthesia was reviewed.                        - The risks and benefits of the procedure and the                         sedation options and risks were discussed with the                         patient. All questions were answered and informed                         consent was obtained.                        - ASA Grade Assessment: II - A patient with mild                         systemic disease.                        After obtaining informed consent, the colonoscope was                         passed under direct vision. Throughout the procedure,                         the patient's blood pressure, pulse, and oxygen                         saturations were monitored continuously. The                         Colonoscope was  introduced through the anus and                         advanced to the the cecum, identified by the                         appendiceal orifice, IC valve and transillumination.                         The colonoscopy was performed with ease. The patient                          tolerated the procedure well. The quality of the bowel                         preparation was excellent. The appendiceal orifice was                         photographed. Findings:      The perianal and digital rectal examinations were normal.      A 5 mm polyp was found in the descending colon. The polyp was sessile.       The polyp was removed with a cold snare. Resection and retrieval were       complete.      The exam was otherwise without abnormality on direct and retroflexion       views. Impression:            - One 5 mm polyp in the descending colon, removed with                         a cold snare. Resected and retrieved.                        - The examination was otherwise normal on direct and                         retroflexion views. Recommendation:        - Discharge patient to home (with escort).                        - Resume previous diet.                        - Continue present medications.                        - Await pathology results.                        - Repeat colonoscopy for surveillance based on                         pathology results. Procedure Code(s):     --- Professional ---                        [phone], Colonoscopy, flexible; with removal of                         tumor(s), polyp(s), or other lesion(s) by  snare                         technique Diagnosis Code(s):     --- Professional ---                        Z12.11, Encounter for screening for malignant neoplasm                         of colon                        D12.4, Benign neoplasm of descending colon CPT copyright 2022 American Medical Association. All rights reserved. The codes documented in this report are preliminary and upon coder review may  be revised to meet current compliance requirements. [name], MD [name] MD, MD 05/28/2022 8:09:13 AM This report has been signed electronically. Number of Addenda: 0 Note Initiated On: 05/28/2022 7:52  AM Scope Withdrawal Time: 0 hours 8 minutes 52 seconds  Total Procedure Duration: 0 hours 11 minutes 14 seconds  Estimated Blood Loss:  Estimated blood loss: none.      [facility]

## 2022-05-28 NOTE — H&P (Signed)
Daniel Bellows, MD [address] [place], [place], [place], [address] [address], [place], [place], [place], [address] Phone: [phone]  Fax: [phone]  Primary Care Physician:  Tower, Wynelle Fanny, MD   Pre-Procedure History & Physical: HPI:  Daniel Lee is a 66 y.o. male is here for an colonoscopy.   Past Medical History:  Diagnosis Date   BPH (benign prostatic hypertrophy)    Diabetes mellitus without complication ([place])    Diabetes type 2, controlled ([place]) 04/16/2008   Qualifier: Diagnosis of  By: Glori Bickers MD, Carmell Austria    ED (erectile dysfunction)    Elevated alanine aminotransferase (ALT) level    suspect fatty liver   Hyperglycemia    Hypertension     Past Surgical History:  Procedure Laterality Date   abd ultrasound  11/2005   negative   COLONOSCOPY     HOLEP-LASER ENUCLEATION OF THE PROSTATE WITH MORCELLATION N/A 12/28/2017   Procedure: HOLEP-LASER ENUCLEATION OF THE PROSTATE WITH MORCELLATION;  Surgeon: Hollice Espy, MD;  Location: [facility];  Service: Urology;  Laterality: N/A;   PROSTATE SURGERY  2003   prostate biopsy negative // urologist    Prior to Admission medications   Medication Sig Start Date End Date Taking? Authorizing Provider  amLODipine (NORVASC) 5 MG tablet Take 1 tablet (5 mg total) by mouth daily. 03/23/22  Yes Tower, Wynelle Fanny, MD  atorvastatin (LIPITOR) 10 MG tablet Take 1 tablet (10 mg total) by mouth daily. 03/23/22  Yes Tower, Wynelle Fanny, MD  dutasteride (AVODART) 0.5 MG capsule TAKE 1 CAPSULE BY MOUTH EVERY DAY 06/13/21  Yes Hollice Espy, MD  losartan-hydrochlorothiazide (HYZAAR) 100-25 MG tablet TAKE 1 TABLET BY MOUTH EVERY DAY 04/24/22  Yes Tower, Wynelle Fanny, MD  metFORMIN (GLUCOPHAGE) 1000 MG tablet Take 1 tablet (1,000 mg total) by mouth 2 (two) times daily with a meal. 03/23/22  Yes Tower, Wynelle Fanny, MD  ACCU-CHEK AVIVA PLUS test strip USE TO CHECK BLOOD SUGAR ONCE DAILY AND AS NEEDED (DX. E11.9) 09/23/21   Tower, Wynelle Fanny, MD   Lancets (ACCU-CHEK MULTICLIX) lancets Use as instructed 07/30/17   Tower, Wynelle Fanny, MD  sildenafil (REVATIO) 20 MG tablet Take 1 tablet (20 mg total) by mouth as needed. Take 1-5 tabs as needed prior to intercourse 06/18/21   Hollice Espy, MD    Allergies as of 03/25/2022   (No Known Allergies)    Family History  Problem Relation Age of Onset   Cancer Father        prostate cancer    Diabetes Father    Stroke Father    Prostate cancer Father    Hypertension Mother    Hyperlipidemia Mother    Bladder Cancer Mother    Heart disease Brother    Diabetes Brother    Kidney disease Brother    Cancer Paternal Uncle        prostate cancer   Kidney cancer Neg Hx     Social History   Socioeconomic History   Marital status: Married    Spouse name: Not on file   Number of children: Not on file   Years of education: Not on file   Highest education level: Not on file  Occupational History   Not on file  Tobacco Use   Smoking status: Former    Packs/day: 0.25    Types: Cigarettes    Quit date: 07/13/2001    Years since quitting: 20.8   Smokeless tobacco: Former  Types: Sarina Ser    Quit date: 07/13/2001   Tobacco comments:    smoked a cigarette every couple of days  Vaping Use   Vaping Use: Never used  Substance and Sexual Activity   Alcohol use: No    Alcohol/week: 0.0 standard drinks of alcohol   Drug use: No   Sexual activity: Yes  Other Topics Concern   Not on file  Social History Narrative   Walks 4 miles a day for exercise.   Social Determinants of Health   Financial Resource Strain: Not on file  Food Insecurity: Not on file  Transportation Needs: Not on file  Physical Activity: Not on file  Stress: Not on file  Social Connections: Not on file  Intimate Partner Violence: Not on file    Review of Systems: See HPI, otherwise negative ROS  Physical Exam: BP (!) 140/88   Pulse 93   Temp 97.7 F (36.5 C) (Temporal)   Resp 18   Ht '5\' 8"'$  (1.727 m)   Wt 102.1  kg   SpO2 99%   BMI 34.21 kg/m  General:   Alert,  pleasant and cooperative in NAD Head:  Normocephalic and atraumatic. Neck:  Supple; no masses or thyromegaly. Lungs:  Clear throughout to auscultation, normal respiratory effort.    Heart:  +S1, +S2, Regular rate and rhythm, No edema. Abdomen:  Soft, nontender and nondistended. Normal bowel sounds, without guarding, and without rebound.   Neurologic:  Alert and  oriented x4;  grossly normal neurologically.  Impression/Plan: Daniel Lee is here for an colonoscopy to be performed for Screening colonoscopy average risk   Risks, benefits, limitations, and alternatives regarding  colonoscopy have been reviewed with the patient.  Questions have been answered.  All parties agreeable.   Daniel Bellows, MD  05/28/2022, 7:49 AM

## 2022-05-28 NOTE — Anesthesia Postprocedure Evaluation (Signed)
Anesthesia Post Note  Patient: Daniel Lee  Procedure(s) Performed: COLONOSCOPY WITH PROPOFOL  Patient location during evaluation: Endoscopy Anesthesia Type: General Level of consciousness: awake and alert Pain management: pain level controlled Vital Signs Assessment: post-procedure vital signs reviewed and stable Respiratory status: spontaneous breathing, nonlabored ventilation, respiratory function stable and patient connected to nasal cannula oxygen Cardiovascular status: blood pressure returned to baseline and stable Postop Assessment: no apparent nausea or vomiting Anesthetic complications: no  No notable events documented.   Last Vitals:  Vitals:   05/28/22 0712 05/28/22 0813  BP: (!) 140/88 116/76  Pulse: 93 92  Resp: 18 18  Temp: 36.5 C   SpO2: 99% 100%    Last Pain:  Vitals:   05/28/22 0813  TempSrc:   PainSc: 0-No pain                 Ilene Qua

## 2022-05-28 NOTE — Transfer of Care (Signed)
Immediate Anesthesia Transfer of Care Note  Patient: Daniel Lee  Procedure(s) Performed: COLONOSCOPY WITH PROPOFOL  Patient Location: Endoscopy Unit  Anesthesia Type:General  Level of Consciousness: drowsy  Airway & Oxygen Therapy: Patient Spontanous Breathing  Post-op Assessment: Report given to RN  Post vital signs: stable  Last Vitals:  Vitals Value Taken Time  BP    Temp    Pulse    Resp    SpO2      Last Pain:  Vitals:   05/28/22 9390  TempSrc: Temporal  PainSc: 0-No pain         Complications: No notable events documented.

## 2022-05-28 NOTE — Anesthesia Preprocedure Evaluation (Addendum)
Anesthesia Evaluation  Patient identified by MRN, date of birth, ID band Patient awake    Reviewed: Allergy & Precautions, NPO status , Patient's Chart, lab work & pertinent test results  History of Anesthesia Complications Negative for: history of anesthetic complications  Airway Mallampati: III  TM Distance: >3 FB Neck ROM: full    Dental  (+) Teeth Intact   Pulmonary former smoker   Pulmonary exam normal        Cardiovascular hypertension, On Medications Normal cardiovascular exam     Neuro/Psych negative neurological ROS  negative psych ROS   GI/Hepatic negative GI ROS, Neg liver ROS,,,  Endo/Other  diabetes    Renal/GU negative Renal ROS  negative genitourinary   Musculoskeletal   Abdominal   Peds  Hematology negative hematology ROS (+)   Anesthesia Other Findings Past Medical History: No date: BPH (benign prostatic hypertrophy) No date: Diabetes mellitus without complication ([place]) 65/0/3546: Diabetes type 2, controlled ([place])     Comment:  Qualifier: Diagnosis of  By: Tower MD, Carmell Austria  No date: ED (erectile dysfunction) No date: Elevated alanine aminotransferase (ALT) level     Comment:  suspect fatty liver No date: Hyperglycemia No date: Hypertension  Past Surgical History: 11/2005: abd ultrasound     Comment:  negative No date: COLONOSCOPY 12/28/2017: HOLEP-LASER ENUCLEATION OF THE PROSTATE WITH MORCELLATION;  N/A     Comment:  Procedure: HOLEP-LASER ENUCLEATION OF THE PROSTATE WITH               MORCELLATION;  Surgeon: Hollice Espy, MD;  Location:               [facility];  Service: Urology;  Laterality: N/A; 2003: PROSTATE SURGERY     Comment:  prostate biopsy negative // urologist     Reproductive/Obstetrics negative OB ROS                             Anesthesia Physical Anesthesia Plan  ASA: 2  Anesthesia Plan: General   Post-op Pain Management: Minimal  or no pain anticipated   Induction: Intravenous  PONV Risk Score and Plan: Propofol infusion and TIVA  Airway Management Planned: Natural Airway and Nasal Cannula  Additional Equipment:   Intra-op Plan:   Post-operative Plan:   Informed Consent: I have reviewed the patients History and Physical, chart, labs and discussed the procedure including the risks, benefits and alternatives for the proposed anesthesia with the patient or authorized representative who has indicated his/her understanding and acceptance.     Dental Advisory Given  Plan Discussed with: Anesthesiologist, CRNA and Surgeon  Anesthesia Plan Comments: (Patient consented for risks of anesthesia including but not limited to:  - adverse reactions to medications - risk of airway placement if required - damage to eyes, teeth, lips or other oral mucosa - nerve damage due to positioning  - sore throat or hoarseness - Damage to heart, brain, nerves, lungs, other parts of body or loss of life  Patient voiced understanding.)       Anesthesia Quick Evaluation

## 2022-05-29 ENCOUNTER — Encounter: Payer: Self-pay | Admitting: Gastroenterology

## 2022-05-29 LAB — SURGICAL PATHOLOGY

## 2022-05-31 ENCOUNTER — Other Ambulatory Visit: Payer: Self-pay | Admitting: Urology

## 2022-06-09 ENCOUNTER — Encounter: Payer: Self-pay | Admitting: Gastroenterology

## 2022-06-12 ENCOUNTER — Other Ambulatory Visit: Payer: 59

## 2022-06-12 DIAGNOSIS — N401 Enlarged prostate with lower urinary tract symptoms: Secondary | ICD-10-CM

## 2022-06-13 LAB — PSA: Prostate Specific Ag, Serum: 1.9 ng/mL (ref 0.0–4.0)

## 2022-06-16 ENCOUNTER — Other Ambulatory Visit: Payer: 59

## 2022-06-16 ENCOUNTER — Other Ambulatory Visit: Payer: Self-pay | Admitting: Family Medicine

## 2022-06-24 ENCOUNTER — Ambulatory Visit (INDEPENDENT_AMBULATORY_CARE_PROVIDER_SITE_OTHER): Payer: 59 | Admitting: Urology

## 2022-06-24 VITALS — BP 170/97 | HR 99 | Ht 68.0 in

## 2022-06-24 DIAGNOSIS — N138 Other obstructive and reflux uropathy: Secondary | ICD-10-CM | POA: Diagnosis not present

## 2022-06-24 DIAGNOSIS — R310 Gross hematuria: Secondary | ICD-10-CM

## 2022-06-24 DIAGNOSIS — Z87898 Personal history of other specified conditions: Secondary | ICD-10-CM

## 2022-06-24 DIAGNOSIS — N5203 Combined arterial insufficiency and corporo-venous occlusive erectile dysfunction: Secondary | ICD-10-CM

## 2022-06-24 DIAGNOSIS — R339 Retention of urine, unspecified: Secondary | ICD-10-CM

## 2022-06-24 DIAGNOSIS — N401 Enlarged prostate with lower urinary tract symptoms: Secondary | ICD-10-CM | POA: Diagnosis not present

## 2022-06-24 LAB — BLADDER SCAN AMB NON-IMAGING: Scan Result: 143

## 2022-06-24 MED ORDER — SILDENAFIL CITRATE 20 MG PO TABS: 20.0000 mg | ORAL_TABLET | ORAL | 12 refills | Status: DC | PRN

## 2022-06-24 MED ORDER — DUTASTERIDE 0.5 MG PO CAPS: ORAL_CAPSULE | ORAL | 3 refills | Status: DC

## 2022-06-24 NOTE — Progress Notes (Signed)
06/24/2022 10:14 AM   Daniel Lee 10/25/1955 [id number]  Referring provider: Abner Greenspan, MD [address] [place],  [place] [address]  Chief Complaint  Patient presents with   Benign Prostatic Hypertrophy    HPI: 66 year old male with a personal history of BPH, intermittent gross hematuria and elevated PSA returns today for follow-up.  He is status post HoLEP 2019 but continues finasteride for intermittent hematuria.  Cystoscopy 2021 indicated friable prostatic mucosa and regrowth of his right lateral lobe for hematuria evaluation.  CT urogram negative.    Has a personal history of ED on sildenafil.  He mentions today that he has had no episodes of gross hematuria this year.  He is pleased with his urinary symptoms although is more bothered by urinary urgency than he used to be.  He is struggling little bit with diabetes control, his metformin was just increased, most recent hemoglobin A1c in the 7 range, previously in the 6 range.   Prostate Specific Ag, Serum  Latest Ref Rng 0.0 - 4.0 ng/mL  09/24/2017 13.5 (H)   08/12/2018 6.3 (H)   06/20/2019 4.2 (H)   12/19/2019 4.8 (H)   06/14/2020 2.5   06/16/2021 2.3   06/12/2022 1.9     Legend: (H) High   IPSS     Row Name 06/24/22 0900         International Prostate Symptom Score   How often have you had the sensation of not emptying your bladder? Less than half the time     How often have you had to urinate less than every two hours? About half the time     How often have you found you stopped and started again several times when you urinated? Not at All     How often have you found it difficult to postpone urination? About half the time     How often have you had a weak urinary stream? Not at All     How often have you had to strain to start urination? Not at All     How many times did you typically get up at night to urinate? 1 Time     Total IPSS Score 9       Quality of Life due to urinary symptoms   If  you were to spend the rest of your life with your urinary condition just the way it is now how would you feel about that? Pleased              Score:  1-7 Mild 8-19 Moderate 20-35 Severe  Results for orders placed or performed in visit on 06/24/22  Bladder Scan (Post Void Residual) in office  Result Value Ref Range   Scan Result 143 ml        PMH: Past Medical History:  Diagnosis Date   BPH (benign prostatic hypertrophy)    Diabetes mellitus without complication ([place])    Diabetes type 2, controlled ([place]) 04/16/2008   Qualifier: Diagnosis of  By: Glori Bickers MD, Carmell Austria    ED (erectile dysfunction)    Elevated alanine aminotransferase (ALT) level    suspect fatty liver   Hyperglycemia    Hypertension     Surgical History: Past Surgical History:  Procedure Laterality Date   abd ultrasound  11/2005   negative   COLONOSCOPY     COLONOSCOPY WITH PROPOFOL N/A 05/28/2022   Procedure: COLONOSCOPY WITH PROPOFOL;  Surgeon: [name], MD;  Location: [facility];  Service: Gastroenterology;  Laterality: N/A;   HOLEP-LASER ENUCLEATION OF THE PROSTATE WITH MORCELLATION N/A 12/28/2017   Procedure: HOLEP-LASER ENUCLEATION OF THE PROSTATE WITH MORCELLATION;  Surgeon: Hollice Espy, MD;  Location: [facility];  Service: Urology;  Laterality: N/A;   PROSTATE SURGERY  2003   prostate biopsy negative // urologist    Home Medications:  Allergies as of 06/24/2022   No Known Allergies      Medication List        Accurate as of June 24, 2022 10:14 AM. If you have any questions, ask your nurse or doctor.          Accu-Chek Aviva Plus test strip Generic drug: glucose blood USE TO CHECK BLOOD SUGAR ONCE DAILY AND AS NEEDED (DX. E11.9)   accu-chek multiclix lancets Use as instructed   amLODipine 5 MG tablet Commonly known as: NORVASC Take 1 tablet (5 mg total) by mouth daily.   atorvastatin 10 MG tablet Commonly known as: LIPITOR Take 1 tablet (10 mg total) by  mouth daily.   dutasteride 0.5 MG capsule Commonly known as: AVODART TAKE 1 CAPSULE BY MOUTH EVERY DAY What changed:  how much to take how to take this when to take this additional instructions   losartan-hydrochlorothiazide 100-25 MG tablet Commonly known as: HYZAAR TAKE 1 TABLET BY MOUTH EVERY DAY   metFORMIN 1000 MG tablet Commonly known as: GLUCOPHAGE Take 1 tablet (1,000 mg total) by mouth 2 (two) times daily with a meal.   sildenafil 20 MG tablet Commonly known as: Revatio Take 1 tablet (20 mg total) by mouth as needed. Take 1-5 tabs as needed prior to intercourse        Allergies: No Known Allergies  Family History: Family History  Problem Relation Age of Onset   Cancer Father        prostate cancer    Diabetes Father    Stroke Father    Prostate cancer Father    Hypertension Mother    Hyperlipidemia Mother    Bladder Cancer Mother    Heart disease Brother    Diabetes Brother    Kidney disease Brother    Cancer Paternal Uncle        prostate cancer   Kidney cancer Neg Hx     Social History:  reports that he quit smoking about 20 years ago. His smoking use included cigarettes. He smoked an average of .25 packs per day. He quit smokeless tobacco use about 20 years ago.  His smokeless tobacco use included chew. He reports that he does not drink alcohol and does not use drugs.   Physical Exam: BP (!) 170/97   Pulse 99   Ht '5\' 8"'$  (1.727 m)   BMI 34.21 kg/m   Constitutional:  Alert and oriented, No acute distress. HEENT: [place] AT, moist mucus membranes.  Trachea midline, no masses. Cardiovascular: No clubbing, cyanosis, or edema. Respiratory: Normal respiratory effort, no increased work of breathing. Rectal: Normal sphincter tone.  Enlarged prostate, 50+ cc, rubbery without nodularity Neurologic: Grossly intact, no focal deficits, moving all 4 extremities. Psychiatric: Normal mood and affect.  Laboratory Data: Lab Results  Component Value Date   WBC  5.5 03/17/2022   HGB 13.5 03/17/2022   HCT 40.4 03/17/2022   MCV 88.1 03/17/2022   PLT 167.0 03/17/2022    Lab Results  Component Value Date   CREATININE 0.87 03/17/2022   Lab Results  Component Value Date   HGBA1C 7.2 (H) 03/17/2022     Assessment &  Plan:    1. BPH with urinary obstruction Status post holep with improvement of obstructive urinary symptoms  Worsening urgency although in the setting of incomplete bladder emptying, hesitant to start anticholinergic medication.  I urged him to work on his diabetes control along with other behavioral modification.  If this fails, we will consider adjustment in his medication down the road.  He is agreeable this plan. - Bladder Scan (Post Void Residual) in office  2. Incomplete bladder emptying Will continue to monitor, recheck PVR at next visit  3. Gross hematuria Continue dutasteride for the purpose of gross hematuria episodes related to friable prostatic vessels  Will check urinalysis to eval for microscopic blood next year, may consider repeat hematuria evaluation at that point in time if present  4. History of elevated PSA PSA appropriately trending downward, will continue to trend  Rectal exam unremarkable, prostate cancer screening updated - PSA; Future  5. Combined arterial insufficiency and corporo-venous occlusive erectile dysfunction Sildenafil refilled today   Return for 1 yr with UA/ ipss/pvr/dre/psa prior.  Hollice Espy, MD  [facility] [address], [place], [place] [address] [phone]

## 2022-06-25 ENCOUNTER — Other Ambulatory Visit: Payer: Self-pay | Admitting: Family Medicine

## 2022-06-26 ENCOUNTER — Ambulatory Visit (INDEPENDENT_AMBULATORY_CARE_PROVIDER_SITE_OTHER): Payer: 59 | Admitting: Family Medicine

## 2022-06-26 ENCOUNTER — Encounter: Payer: Self-pay | Admitting: Family Medicine

## 2022-06-26 VITALS — BP 135/70 | HR 76 | Temp 97.5°F | Ht 68.0 in | Wt 229.2 lb

## 2022-06-26 DIAGNOSIS — E785 Hyperlipidemia, unspecified: Secondary | ICD-10-CM | POA: Diagnosis not present

## 2022-06-26 DIAGNOSIS — E119 Type 2 diabetes mellitus without complications: Secondary | ICD-10-CM

## 2022-06-26 DIAGNOSIS — E1169 Type 2 diabetes mellitus with other specified complication: Secondary | ICD-10-CM

## 2022-06-26 DIAGNOSIS — I10 Essential (primary) hypertension: Secondary | ICD-10-CM

## 2022-06-26 LAB — MICROALBUMIN / CREATININE URINE RATIO
Creatinine,U: 120.5 mg/dL
Microalb Creat Ratio: 10.9 mg/g (ref 0.0–30.0)
Microalb, Ur: 13.2 mg/dL — ABNORMAL HIGH (ref 0.0–1.9)

## 2022-06-26 LAB — POCT GLYCOSYLATED HEMOGLOBIN (HGB A1C): Hemoglobin A1C: 6.6 % — AB (ref 4.0–5.6)

## 2022-06-26 NOTE — Progress Notes (Signed)
Subjective:    Patient ID: Daniel Lee, male    DOB: [birth year], 66 y.o.   MRN: [id number]  HPI Pt presents for f/u of DM2 and chronic medical problems   Wt Readings from Last 3 Encounters:  06/26/22 229 lb 4 oz (104 kg)  05/28/22 225 lb (102.1 kg)  03/23/22 229 lb 2 oz (103.9 kg)   34.86 kg/m  Doing ok   Has been taking Thursday off most weeks   Feeling ok   Had a colonoscopy - one polyp   HTN bp is stable today  No cp or palpitations or headaches or edema  No side effects to medicines  BP Readings from Last 3 Encounters:  06/26/22 135/70  06/24/22 (!) 170/97  05/28/22 129/82    Losartan hct 100-25 mg daily  Amlodipine 5 mg daily   DM2 Lab Results  Component Value Date   HGBA1C 7.2 (H) 03/17/2022    Some walking for exercise  Could do more  Making an effort to eat less sugar but this is a tough time of year   Improved today Lab Results  Component Value Date   HGBA1C 6.6 (A) 06/26/2022     Metformin was inc to 1000 mg bid at last visit   Takes arb and statin  Eye exam  02/2022 utd   Due for microalb Lab Results  Component Value Date   LABMICR See below: 10/18/2020   LABMICR See below: 12/19/2019   MICROALBUR 23.2 (H) 03/17/2022   MICROALBUR 1.0 06/23/2010    Hyperlipidemia Lab Results  Component Value Date   CHOL 101 03/17/2022   HDL 34.60 (L) 03/17/2022   LDLCALC 44 03/17/2022   LDLDIRECT 99.9 06/23/2011   TRIG 110.0 03/17/2022   CHOLHDL 3 03/17/2022   Atorvastatin 10 mg daily   Patient Active Problem List   Diagnosis Date Noted   Adenomatous polyp of colon 05/28/2022   Screening for colon cancer 03/23/2022   Ganglion cyst of right foot 05/24/2020   Routine general medical examination at a health care facility 06/22/2011   Essential hypertension, benign 04/25/2009   Hyperlipidemia associated with type 2 diabetes mellitus ([place]) 07/19/2008   Diabetes type 2, controlled ([place]) 04/16/2008   Low HDL (under 40) 03/18/2007    ERECTILE DYSFUNCTION 10/07/2006   PROSTATE SPECIFIC ANTIGEN, ELEVATED 10/07/2006   BPH (benign prostatic hyperplasia) 10/07/2006   Past Medical History:  Diagnosis Date   BPH (benign prostatic hypertrophy)    Diabetes mellitus without complication ([place])    Diabetes type 2, controlled ([place]) 04/16/2008   Qualifier: Diagnosis of  By: Glori Bickers MD, Carmell Austria    ED (erectile dysfunction)    Elevated alanine aminotransferase (ALT) level    suspect fatty liver   Hyperglycemia    Hypertension    Past Surgical History:  Procedure Laterality Date   abd ultrasound  11/2005   negative   COLONOSCOPY     COLONOSCOPY WITH PROPOFOL N/A 05/28/2022   Procedure: COLONOSCOPY WITH PROPOFOL;  Surgeon: [name], MD;  Location: [facility] ENDOSCOPY;  Service: Gastroenterology;  Laterality: N/A;   HOLEP-LASER ENUCLEATION OF THE PROSTATE WITH MORCELLATION N/A 12/28/2017   Procedure: HOLEP-LASER ENUCLEATION OF THE PROSTATE WITH MORCELLATION;  Surgeon: Hollice Espy, MD;  Location: [facility] ORS;  Service: Urology;  Laterality: N/A;   PROSTATE SURGERY  2003   prostate biopsy negative // urologist   Social History   Tobacco Use   Smoking status: Former    Packs/day: 0.25    Types: Cigarettes  Quit date: 07/13/2001    Years since quitting: 20.9   Smokeless tobacco: Former    Types: Chew    Quit date: 07/13/2001   Tobacco comments:    smoked a cigarette every couple of days  Vaping Use   Vaping Use: Never used  Substance Use Topics   Alcohol use: No    Alcohol/week: 0.0 standard drinks of alcohol   Drug use: No   Family History  Problem Relation Age of Onset   Cancer Father        prostate cancer    Diabetes Father    Stroke Father    Prostate cancer Father    Hypertension Mother    Hyperlipidemia Mother    Bladder Cancer Mother    Heart disease Brother    Diabetes Brother    Kidney disease Brother    Cancer Paternal Uncle        prostate cancer   Kidney cancer Neg Hx    No Known  Allergies Current Outpatient Medications on File Prior to Visit  Medication Sig Dispense Refill   amLODipine (NORVASC) 5 MG tablet Take 1 tablet (5 mg total) by mouth daily. 90 tablet 3   atorvastatin (LIPITOR) 10 MG tablet Take 1 tablet (10 mg total) by mouth daily. 90 tablet 3   dutasteride (AVODART) 0.5 MG capsule TAKE 1 CAPSULE BY MOUTH EVERY DAY 90 capsule 3   glucose blood (ACCU-CHEK AVIVA PLUS) test strip USE TO CHECK BLOOD SUGAR ONCE DAILY AND AS NEEDED (DX. E11.9) 100 strip 0   Lancets (ACCU-CHEK MULTICLIX) lancets Use as instructed 100 each 12   losartan-hydrochlorothiazide (HYZAAR) 100-25 MG tablet TAKE 1 TABLET BY MOUTH EVERY DAY 90 tablet 1   metFORMIN (GLUCOPHAGE) 1000 MG tablet Take 1 tablet (1,000 mg total) by mouth 2 (two) times daily with a meal. 180 tablet 3   sildenafil (REVATIO) 20 MG tablet Take 1 tablet (20 mg total) by mouth as needed. Take 1-5 tabs as needed prior to intercourse 30 tablet 12   No current facility-administered medications on file prior to visit.      Review of Systems  Constitutional:  Negative for activity change, appetite change, fatigue, fever and unexpected weight change.  HENT:  Negative for congestion, rhinorrhea, sore throat and trouble swallowing.   Eyes:  Negative for pain, redness, itching and visual disturbance.  Respiratory:  Negative for cough, chest tightness, shortness of breath and wheezing.   Cardiovascular:  Negative for chest pain and palpitations.  Gastrointestinal:  Negative for abdominal pain, blood in stool, constipation, diarrhea and nausea.  Endocrine: Negative for cold intolerance, heat intolerance, polydipsia and polyuria.  Genitourinary:  Negative for difficulty urinating, dysuria, frequency and urgency.  Musculoskeletal:  Negative for arthralgias, joint swelling and myalgias.  Skin:  Negative for pallor and rash.  Neurological:  Negative for dizziness, tremors, weakness, numbness and headaches.  Hematological:  Negative  for adenopathy. Does not bruise/bleed easily.  Psychiatric/Behavioral:  Negative for decreased concentration and dysphoric mood. The patient is not nervous/anxious.        Objective:   Physical Exam Constitutional:      General: He is not in acute distress.    Appearance: Normal appearance. He is well-developed. He is obese. He is not ill-appearing or diaphoretic.  HENT:     Head: Normocephalic and atraumatic.  Eyes:     Conjunctiva/sclera: Conjunctivae normal.     Pupils: Pupils are equal, round, and reactive to light.  Neck:     Thyroid:  No thyromegaly.     Vascular: No carotid bruit or JVD.  Cardiovascular:     Rate and Rhythm: Normal rate and regular rhythm.     Heart sounds: Normal heart sounds.     No gallop.  Pulmonary:     Effort: Pulmonary effort is normal. No respiratory distress.     Breath sounds: Normal breath sounds. No wheezing or rales.  Abdominal:     General: There is no distension or abdominal bruit.     Palpations: Abdomen is soft.  Musculoskeletal:     Cervical back: Normal range of motion and neck supple.     Right lower leg: No edema.     Left lower leg: No edema.  Lymphadenopathy:     Cervical: No cervical adenopathy.  Skin:    General: Skin is warm and dry.     Coloration: Skin is not pale.     Findings: No rash.  Neurological:     Mental Status: He is alert.     Coordination: Coordination normal.     Deep Tendon Reflexes: Reflexes are normal and symmetric. Reflexes normal.  Psychiatric:        Mood and Affect: Mood normal.           Assessment & Plan:   Problem List Items Addressed This Visit       Cardiovascular and Mediastinum   Essential hypertension, benign    bp in fair control at this time  BP Readings from Last 1 Encounters:  06/26/22 135/70  No changes needed Most recent labs reviewed  Disc lifstyle change with low sodium diet and exercise  Plan to continue Losartan hct 100-25 mg daily        Endocrine   Diabetes  type 2, controlled ([place]) - Primary    Lab Results  Component Value Date   HGBA1C 6.6 (A) 06/26/2022  Improved Commended-working on diet some  Enc more exercise  Microalb ordered Plan to continue metformin at higher dose of 1000 mg bid  Eye exam utd       Relevant Orders   POCT glycosylated hemoglobin (Hb A1C) (Completed)   Microalbumin / creatinine urine ratio (Completed)   Hyperlipidemia associated with type 2 diabetes mellitus ([place])    Disc goals for lipids and reasons to control them Rev last labs with pt Rev low sat fat diet in detail  Taking atorvastatin 10 mg daily  LDL at goal    44

## 2022-06-26 NOTE — Patient Instructions (Addendum)
Take care of yourself    Try to get most of your carbohydrates from produce (with the exception of white potatoes)  Eat less bread/pasta/rice/snack foods/cereals/sweets and other items from the middle of the grocery store (processed carbs)  Think about adding some strength training   Work on weight loss   Blood pressure is stable   Follow up in 6 months

## 2022-06-26 NOTE — Assessment & Plan Note (Signed)
Lab Results  Component Value Date   HGBA1C 6.6 (A) 06/26/2022   Improved Commended-working on diet some  Enc more exercise  Microalb ordered Plan to continue metformin at higher dose of 1000 mg bid  Eye exam utd

## 2022-06-26 NOTE — Assessment & Plan Note (Signed)
bp in fair control at this time  BP Readings from Last 1 Encounters:  06/26/22 135/70   No changes needed Most recent labs reviewed  Disc lifstyle change with low sodium diet and exercise  Plan to continue Losartan hct 100-25 mg daily

## 2022-06-28 NOTE — Assessment & Plan Note (Signed)
Disc goals for lipids and reasons to control them Rev last labs with pt Rev low sat fat diet in detail  Taking atorvastatin 10 mg daily  LDL at goal    44

## 2022-10-15 ENCOUNTER — Other Ambulatory Visit: Payer: Self-pay | Admitting: Family Medicine

## 2022-10-15 DIAGNOSIS — I10 Essential (primary) hypertension: Secondary | ICD-10-CM

## 2022-12-28 ENCOUNTER — Ambulatory Visit (INDEPENDENT_AMBULATORY_CARE_PROVIDER_SITE_OTHER): Payer: 59 | Admitting: Family Medicine

## 2022-12-28 ENCOUNTER — Encounter: Payer: Self-pay | Admitting: Family Medicine

## 2022-12-28 VITALS — BP 132/77 | HR 76 | Temp 97.8°F | Ht 68.0 in | Wt 226.0 lb

## 2022-12-28 DIAGNOSIS — Z7984 Long term (current) use of oral hypoglycemic drugs: Secondary | ICD-10-CM | POA: Diagnosis not present

## 2022-12-28 DIAGNOSIS — Z6830 Body mass index (BMI) 30.0-30.9, adult: Secondary | ICD-10-CM

## 2022-12-28 DIAGNOSIS — I10 Essential (primary) hypertension: Secondary | ICD-10-CM

## 2022-12-28 DIAGNOSIS — E1169 Type 2 diabetes mellitus with other specified complication: Secondary | ICD-10-CM | POA: Diagnosis not present

## 2022-12-28 DIAGNOSIS — E669 Obesity, unspecified: Secondary | ICD-10-CM

## 2022-12-28 DIAGNOSIS — E785 Hyperlipidemia, unspecified: Secondary | ICD-10-CM | POA: Diagnosis not present

## 2022-12-28 DIAGNOSIS — E119 Type 2 diabetes mellitus without complications: Secondary | ICD-10-CM

## 2022-12-28 LAB — COMPREHENSIVE METABOLIC PANEL
ALT: 47 U/L (ref 0–53)
AST: 30 U/L (ref 0–37)
Albumin: 4.7 g/dL (ref 3.5–5.2)
Alkaline Phosphatase: 76 U/L (ref 39–117)
BUN: 13 mg/dL (ref 6–23)
CO2: 29 mEq/L (ref 19–32)
Calcium: 10 mg/dL (ref 8.4–10.5)
Chloride: 101 mEq/L (ref 96–112)
Creatinine, Ser: 0.98 mg/dL (ref 0.40–1.50)
GFR: 79.12 mL/min (ref >60.00)
Glucose, Bld: 136 mg/dL — ABNORMAL HIGH (ref 70–99)
Potassium: 4.2 mEq/L (ref 3.5–5.1)
Sodium: 138 mEq/L (ref 135–145)
Total Bilirubin: 1.6 mg/dL — ABNORMAL HIGH (ref 0.2–1.2)
Total Protein: 8 g/dL (ref 6.0–8.3)

## 2022-12-28 LAB — LIPID PANEL
Cholesterol: 117 mg/dL (ref 0–200)
HDL: 35 mg/dL — ABNORMAL LOW (ref >39.00)
LDL Cholesterol: 49 mg/dL (ref 0–99)
NonHDL: 81.73
Total CHOL/HDL Ratio: 3
Triglycerides: 162 mg/dL — ABNORMAL HIGH (ref 0.0–149.0)
VLDL: 32.4 mg/dL (ref 0.0–40.0)

## 2022-12-28 LAB — HEMOGLOBIN A1C: Hgb A1c MFr Bld: 6.6 % — ABNORMAL HIGH (ref 4.6–6.5)

## 2022-12-28 NOTE — Progress Notes (Signed)
Subjective:    Patient ID: Daniel Lee, male    DOB: May 21, 1956, 67 y.o.   MRN: [id number]  HPI  Pt presents for follow up of DM2 and HTN and chronic medical problems    Wt Readings from Last 3 Encounters:  12/28/22 226 lb (102.5 kg)  06/26/22 229 lb 4 oz (104 kg)  05/28/22 225 lb (102.1 kg)   34.36 kg/m  Vitals:   12/28/22 0855 12/28/22 0909  BP: 136/74 132/77  Pulse: 76   Temp: 97.8 F (36.6 C)   SpO2: 98%    His mother is alive at 80 with some dementia symptoms Hard to watch overall but coping ok   In general feeling good  Weight has been up and down  Working on it    Looks to be due for pna vaccine  Has pna 13 in 2021   HTN bp is stable today  No cp or palpitations or headaches or edema  No side effects to medicines  BP Readings from Last 3 Encounters:  12/28/22 132/77  06/26/22 135/70  06/24/22 (!) 170/97     Losartan hct 100-25 mg daily  Amlodipine 5 mg daily   Lab Results  Component Value Date   CREATININE 0.87 03/17/2022   BUN 18 03/17/2022   NA 141 03/17/2022   K 4.0 03/17/2022   CL 104 03/17/2022   CO2 27 03/17/2022      DM2 Lab Results  Component Value Date   HGBA1C 6.6 (A) 06/26/2022   Metformin 1000 mg bid   Watching diet - no candy or fried foods in April  Occational cheating since /- maybe once every 2-3 weeks   Exercise - mostly walking / more than 30 min daily (45-55 min per day)  Wants to start using his weight   Eye exam 02/2022   On arb and statin   No hypoglycemia    Lab Results  Component Value Date   LABMICR See below: 10/18/2020   LABMICR See below: 12/19/2019   MICROALBUR 13.2 (H) 06/26/2022   MICROALBUR 23.2 (H) 03/17/2022       Hyperlipidemia Lab Results  Component Value Date   CHOL 101 03/17/2022   HDL 34.60 (L) 03/17/2022   LDLCALC 44 03/17/2022   LDLDIRECT 99.9 06/23/2011   TRIG 110.0 03/17/2022   CHOLHDL 3 03/17/2022   Atorvastatin 10 mg daily     Patient Active Problem List    Diagnosis Date Noted   Adenomatous polyp of colon 05/28/2022   Screening for colon cancer 03/23/2022   Ganglion cyst of right foot 05/24/2020   Obesity (BMI 30-39.9) 08/28/2013   Routine general medical examination at a health care facility 06/22/2011   Essential hypertension, benign 04/25/2009   Hyperlipidemia associated with type 2 diabetes mellitus ([facility]) 07/19/2008   Diabetes type 2, controlled ([facility]) 04/16/2008   Low HDL (under 40) 03/18/2007   ERECTILE DYSFUNCTION 10/07/2006   PROSTATE SPECIFIC ANTIGEN, ELEVATED 10/07/2006   BPH (benign prostatic hyperplasia) 10/07/2006   Past Medical History:  Diagnosis Date   BPH (benign prostatic hypertrophy)    Diabetes mellitus without complication ([facility])    Diabetes type 2, controlled ([facility]) 04/16/2008   Qualifier: Diagnosis of  By: Milinda Antis MD, Colon Flattery    ED (erectile dysfunction)    Elevated alanine aminotransferase (ALT) level    suspect fatty liver   Hyperglycemia    Hypertension    Past Surgical History:  Procedure Laterality Date   abd ultrasound  11/2005  negative   COLONOSCOPY     COLONOSCOPY WITH PROPOFOL N/A 05/28/2022   Procedure: COLONOSCOPY WITH PROPOFOL;  Surgeon: Wyline Mood, MD;  Location: [facility] ENDOSCOPY;  Service: Gastroenterology;  Laterality: N/A;   HOLEP-LASER ENUCLEATION OF THE PROSTATE WITH MORCELLATION N/A 12/28/2017   Procedure: HOLEP-LASER ENUCLEATION OF THE PROSTATE WITH MORCELLATION;  Surgeon: Vanna Scotland, MD;  Location: [facility] ORS;  Service: Urology;  Laterality: N/A;   PROSTATE SURGERY  2003   prostate biopsy negative // urologist   Social History   Tobacco Use   Smoking status: Former    Packs/day: .25    Types: Cigarettes    Quit date: 07/13/2001    Years since quitting: 21.4   Smokeless tobacco: Former    Types: Chew    Quit date: 07/13/2001   Tobacco comments:    smoked a cigarette every couple of days  Vaping Use   Vaping Use: Never used  Substance Use Topics   Alcohol use: No     Alcohol/week: 0.0 standard drinks of alcohol   Drug use: No   Family History  Problem Relation Age of Onset   Cancer Father        prostate cancer    Diabetes Father    Stroke Father    Prostate cancer Father    Hypertension Mother    Hyperlipidemia Mother    Bladder Cancer Mother    Heart disease Brother    Diabetes Brother    Kidney disease Brother    Cancer Paternal Uncle        prostate cancer   Kidney cancer Neg Hx    No Known Allergies Current Outpatient Medications on File Prior to Visit  Medication Sig Dispense Refill   amLODipine (NORVASC) 5 MG tablet Take 1 tablet (5 mg total) by mouth daily. 90 tablet 3   atorvastatin (LIPITOR) 10 MG tablet Take 1 tablet (10 mg total) by mouth daily. 90 tablet 3   dutasteride (AVODART) 0.5 MG capsule TAKE 1 CAPSULE BY MOUTH EVERY DAY 90 capsule 3   glucose blood (ACCU-CHEK AVIVA PLUS) test strip USE TO CHECK BLOOD SUGAR ONCE DAILY AND AS NEEDED (DX. E11.9) 100 strip 0   Lancets (ACCU-CHEK MULTICLIX) lancets Use as instructed 100 each 12   losartan-hydrochlorothiazide (HYZAAR) 100-25 MG tablet TAKE 1 TABLET BY MOUTH EVERY DAY 90 tablet 0   metFORMIN (GLUCOPHAGE) 1000 MG tablet Take 1 tablet (1,000 mg total) by mouth 2 (two) times daily with a meal. 180 tablet 3   sildenafil (REVATIO) 20 MG tablet Take 1 tablet (20 mg total) by mouth as needed. Take 1-5 tabs as needed prior to intercourse 30 tablet 12   No current facility-administered medications on file prior to visit.    Review of Systems  Constitutional:  Negative for activity change, appetite change, fatigue, fever and unexpected weight change.  HENT:  Negative for congestion, rhinorrhea, sore throat and trouble swallowing.   Eyes:  Negative for pain, redness, itching and visual disturbance.  Respiratory:  Negative for cough, chest tightness, shortness of breath and wheezing.   Cardiovascular:  Negative for chest pain and palpitations.  Gastrointestinal:  Negative for abdominal  pain, blood in stool, constipation, diarrhea and nausea.  Endocrine: Negative for cold intolerance, heat intolerance, polydipsia and polyuria.  Genitourinary:  Negative for difficulty urinating, dysuria, frequency and urgency.  Musculoskeletal:  Negative for arthralgias, joint swelling and myalgias.       Occational R medial knee pain  ? OA  Skin:  Negative for  pallor and rash.  Neurological:  Negative for dizziness, tremors, weakness, numbness and headaches.  Hematological:  Negative for adenopathy. Does not bruise/bleed easily.  Psychiatric/Behavioral:  Negative for decreased concentration and dysphoric mood. The patient is not nervous/anxious.        Objective:   Physical Exam Constitutional:      General: He is not in acute distress.    Appearance: Normal appearance. He is well-developed. He is not ill-appearing or diaphoretic.  HENT:     Head: Normocephalic and atraumatic.  Eyes:     Conjunctiva/sclera: Conjunctivae normal.     Pupils: Pupils are equal, round, and reactive to light.  Neck:     Thyroid: No thyromegaly.     Vascular: No carotid bruit or JVD.  Cardiovascular:     Rate and Rhythm: Normal rate and regular rhythm.     Heart sounds: Normal heart sounds.     No gallop.  Pulmonary:     Effort: Pulmonary effort is normal. No respiratory distress.     Breath sounds: Normal breath sounds. No wheezing or rales.  Abdominal:     General: There is no distension or abdominal bruit.     Palpations: Abdomen is soft.  Musculoskeletal:     Cervical back: Normal range of motion and neck supple.     Right lower leg: No edema.     Left lower leg: No edema.  Lymphadenopathy:     Cervical: No cervical adenopathy.  Skin:    General: Skin is warm and dry.     Coloration: Skin is not pale.     Findings: No rash.  Neurological:     Mental Status: He is alert.     Sensory: No sensory deficit.     Coordination: Coordination normal.     Deep Tendon Reflexes: Reflexes are normal  and symmetric. Reflexes normal.  Psychiatric:        Mood and Affect: Mood normal.           Assessment & Plan:   Problem List Items Addressed This Visit       Cardiovascular and Mediastinum   Essential hypertension, benign    bp in fair control at this time  BP Readings from Last 1 Encounters:  12/28/22 132/77  No changes needed Most recent labs reviewed  Disc lifstyle change with low sodium diet and exercise  Plan to continue Losartan hct 100-25 mg daily Labs today      Relevant Orders   Comprehensive metabolic panel     Endocrine   Hyperlipidemia associated with type 2 diabetes mellitus ([facility])    Disc goals for lipids and reasons to control them Rev last labs with pt Rev low sat fat diet in detail  Labs today Encouraged exercise to raise HDL  Taking atorvastatin 10 mg daily       Relevant Orders   Lipid panel   Diabetes type 2, controlled ([facility]) - Primary    A1c today  Last was 6.6  Metformin 1000 mg bid  Watching labs  Better diet Encouraged strength training exercise to help weight and resting metabolic rate Good foot care Eye exam due 02/2023  Microalb utd  On arb and statin  May be due for 2nd pna shot (pneumovax 23)  - will see if he got it at pharmacy       Relevant Orders   Hemoglobin A1c     Other   Obesity (BMI 30-39.9)    Discussed how this problem influences overall  health and the risks it imposes  Reviewed plan for weight loss with lower calorie diet (via better food choices and also portion control or program like weight watchers) and exercise building up to or more than 30 minutes 5 days per week including some aerobic activity  Encouraged low glycemic diet Encouraged to add strength training  Has DM2 and HTN

## 2022-12-28 NOTE — Assessment & Plan Note (Signed)
Disc goals for lipids and reasons to control them Rev last labs with pt Rev low sat fat diet in detail  Labs today Encouraged exercise to raise HDL  Taking atorvastatin 10 mg daily

## 2022-12-28 NOTE — Assessment & Plan Note (Signed)
Discussed how this problem influences overall health and the risks it imposes  Reviewed plan for weight loss with lower calorie diet (via better food choices and also portion control or program like weight watchers) and exercise building up to or more than 30 minutes 5 days per week including some aerobic activity  Encouraged low glycemic diet Encouraged to add strength training  Has DM2 and HTN

## 2022-12-28 NOTE — Assessment & Plan Note (Addendum)
A1c today  Last was 6.6  Metformin 1000 mg bid  Watching labs  Better diet Encouraged strength training exercise to help weight and resting metabolic rate Good foot care Eye exam due 02/2023  Microalb utd  On arb and statin  May be due for 2nd pna shot (pneumovax 23)  - will see if he got it at pharmacy

## 2022-12-28 NOTE — Assessment & Plan Note (Signed)
bp in fair control at this time  BP Readings from Last 1 Encounters:  12/28/22 132/77   No changes needed Most recent labs reviewed  Disc lifstyle change with low sodium diet and exercise  Plan to continue Losartan hct 100-25 mg daily Labs today

## 2022-12-28 NOTE — Patient Instructions (Addendum)
Keep walking Add some strength training to your routine, this is important for bone and brain health and can reduce your risk of falls and help your body use insulin properly and regulate weight  Light weights, exercise bands , and internet videos are a good way to start  Yoga (chair or regular), machines , floor exercises or a gym with machines are also good options    Your eye exam is due in August   I think you are due for a pneumonia booster and a shingles shot   You had a prevnar 13 in 2021  Your first shingrix vaccine was 12/26/21   Check in with your pharmacy, see if you had the 2nd pneumonia vaccine or shingrix   Labs today

## 2023-01-10 ENCOUNTER — Other Ambulatory Visit: Payer: Self-pay | Admitting: Family Medicine

## 2023-01-10 DIAGNOSIS — I10 Essential (primary) hypertension: Secondary | ICD-10-CM

## 2023-03-08 ENCOUNTER — Other Ambulatory Visit: Payer: Self-pay | Admitting: Family Medicine

## 2023-03-08 DIAGNOSIS — E785 Hyperlipidemia, unspecified: Secondary | ICD-10-CM

## 2023-03-08 DIAGNOSIS — I10 Essential (primary) hypertension: Secondary | ICD-10-CM

## 2023-03-17 LAB — HM DIABETES EYE EXAM

## 2023-04-09 ENCOUNTER — Other Ambulatory Visit: Payer: Self-pay | Admitting: Family Medicine

## 2023-04-09 DIAGNOSIS — I10 Essential (primary) hypertension: Secondary | ICD-10-CM

## 2023-04-13 ENCOUNTER — Telehealth: Payer: Self-pay | Admitting: Family Medicine

## 2023-04-13 DIAGNOSIS — E1169 Type 2 diabetes mellitus with other specified complication: Secondary | ICD-10-CM

## 2023-04-13 DIAGNOSIS — N401 Enlarged prostate with lower urinary tract symptoms: Secondary | ICD-10-CM

## 2023-04-13 DIAGNOSIS — E119 Type 2 diabetes mellitus without complications: Secondary | ICD-10-CM

## 2023-04-13 DIAGNOSIS — I10 Essential (primary) hypertension: Secondary | ICD-10-CM

## 2023-04-13 NOTE — Telephone Encounter (Signed)
-----   Message from Alvina Chou sent at 03/30/2023  3:19 PM EDT ----- Regarding: Lab orders for Fri, 10.4.24 Patient is scheduled for CPX labs, please order future labs, Thanks , Camelia Eng

## 2023-04-16 ENCOUNTER — Other Ambulatory Visit (INDEPENDENT_AMBULATORY_CARE_PROVIDER_SITE_OTHER): Payer: 59

## 2023-04-16 DIAGNOSIS — I10 Essential (primary) hypertension: Secondary | ICD-10-CM | POA: Diagnosis not present

## 2023-04-16 DIAGNOSIS — E785 Hyperlipidemia, unspecified: Secondary | ICD-10-CM | POA: Diagnosis not present

## 2023-04-16 DIAGNOSIS — E1169 Type 2 diabetes mellitus with other specified complication: Secondary | ICD-10-CM

## 2023-04-16 DIAGNOSIS — E119 Type 2 diabetes mellitus without complications: Secondary | ICD-10-CM

## 2023-04-16 LAB — LIPID PANEL
Cholesterol: 117 mg/dL (ref 0–200)
HDL: 37.7 mg/dL — ABNORMAL LOW (ref >39.00)
LDL Cholesterol: 54 mg/dL (ref 0–99)
NonHDL: 79.35
Total CHOL/HDL Ratio: 3
Triglycerides: 125 mg/dL (ref 0.0–149.0)
VLDL: 25 mg/dL (ref 0.0–40.0)

## 2023-04-16 LAB — COMPREHENSIVE METABOLIC PANEL
ALT: 53 U/L (ref 0–53)
AST: 35 U/L (ref 0–37)
Albumin: 4.4 g/dL (ref 3.5–5.2)
Alkaline Phosphatase: 71 U/L (ref 39–117)
BUN: 19 mg/dL (ref 6–23)
CO2: 29 meq/L (ref 19–32)
Calcium: 9.7 mg/dL (ref 8.4–10.5)
Chloride: 103 meq/L (ref 96–112)
Creatinine, Ser: 0.84 mg/dL (ref 0.40–1.50)
GFR: 89.29 mL/min (ref >60.00)
Glucose, Bld: 120 mg/dL — ABNORMAL HIGH (ref 70–99)
Potassium: 4.1 meq/L (ref 3.5–5.1)
Sodium: 140 meq/L (ref 135–145)
Total Bilirubin: 1.5 mg/dL — ABNORMAL HIGH (ref 0.2–1.2)
Total Protein: 6.8 g/dL (ref 6.0–8.3)

## 2023-04-16 LAB — CBC WITH DIFFERENTIAL/PLATELET
Basophils Absolute: 0.1 10*3/uL (ref 0.0–0.1)
Basophils Relative: 1 % (ref 0.0–3.0)
Eosinophils Absolute: 0 10*3/uL (ref 0.0–0.7)
Eosinophils Relative: 0.7 % (ref 0.0–5.0)
HCT: 42.4 % (ref 39.0–52.0)
Hemoglobin: 13.8 g/dL (ref 13.0–17.0)
Lymphocytes Relative: 31.3 % (ref 12.0–46.0)
Lymphs Abs: 1.9 10*3/uL (ref 0.7–4.0)
MCHC: 32.5 g/dL (ref 30.0–36.0)
MCV: 88.7 fL (ref 78.0–100.0)
Monocytes Absolute: 0.6 10*3/uL (ref 0.1–1.0)
Monocytes Relative: 9.5 % (ref 3.0–12.0)
Neutro Abs: 3.4 10*3/uL (ref 1.4–7.7)
Neutrophils Relative %: 57.5 % (ref 43.0–77.0)
Platelets: 192 10*3/uL (ref 150.0–400.0)
RBC: 4.78 Mil/uL (ref 4.22–5.81)
RDW: 14 % (ref 11.5–15.5)
WBC: 5.9 10*3/uL (ref 4.0–10.5)

## 2023-04-16 LAB — MICROALBUMIN / CREATININE URINE RATIO
Creatinine,U: 140.5 mg/dL
Microalb Creat Ratio: 24.7 mg/g (ref 0.0–30.0)
Microalb, Ur: 34.7 mg/dL — ABNORMAL HIGH (ref 0.0–1.9)

## 2023-04-16 LAB — HEMOGLOBIN A1C: Hgb A1c MFr Bld: 6.9 % — ABNORMAL HIGH (ref 4.6–6.5)

## 2023-04-16 LAB — TSH: TSH: 3.34 u[IU]/mL (ref 0.35–5.50)

## 2023-04-23 ENCOUNTER — Encounter: Payer: Self-pay | Admitting: Family Medicine

## 2023-04-23 ENCOUNTER — Ambulatory Visit: Payer: 59 | Admitting: Family Medicine

## 2023-04-23 VITALS — BP 130/76 | HR 76 | Temp 98.1°F | Ht 68.0 in | Wt 228.4 lb

## 2023-04-23 DIAGNOSIS — Z23 Encounter for immunization: Secondary | ICD-10-CM

## 2023-04-23 DIAGNOSIS — Z1211 Encounter for screening for malignant neoplasm of colon: Secondary | ICD-10-CM

## 2023-04-23 DIAGNOSIS — Z0000 Encounter for general adult medical examination without abnormal findings: Secondary | ICD-10-CM

## 2023-04-23 DIAGNOSIS — Z7984 Long term (current) use of oral hypoglycemic drugs: Secondary | ICD-10-CM

## 2023-04-23 DIAGNOSIS — I10 Essential (primary) hypertension: Secondary | ICD-10-CM

## 2023-04-23 DIAGNOSIS — E785 Hyperlipidemia, unspecified: Secondary | ICD-10-CM

## 2023-04-23 DIAGNOSIS — E1169 Type 2 diabetes mellitus with other specified complication: Secondary | ICD-10-CM | POA: Diagnosis not present

## 2023-04-23 DIAGNOSIS — D126 Benign neoplasm of colon, unspecified: Secondary | ICD-10-CM

## 2023-04-23 DIAGNOSIS — N401 Enlarged prostate with lower urinary tract symptoms: Secondary | ICD-10-CM

## 2023-04-23 DIAGNOSIS — E669 Obesity, unspecified: Secondary | ICD-10-CM

## 2023-04-23 DIAGNOSIS — E119 Type 2 diabetes mellitus without complications: Secondary | ICD-10-CM

## 2023-04-23 MED ORDER — ATORVASTATIN CALCIUM 10 MG PO TABS
10.0000 mg | ORAL_TABLET | Freq: Every day | ORAL | 3 refills | Status: DC
Start: 2023-04-23 — End: 2024-05-22

## 2023-04-23 MED ORDER — AMLODIPINE BESYLATE 5 MG PO TABS
5.0000 mg | ORAL_TABLET | Freq: Every day | ORAL | 3 refills | Status: DC
Start: 2023-04-23 — End: 2024-05-12

## 2023-04-23 MED ORDER — METFORMIN HCL 1000 MG PO TABS: 500.0000 mg | ORAL_TABLET | Freq: Two times a day (BID) | ORAL | 3 refills | Status: DC

## 2023-04-23 NOTE — Assessment & Plan Note (Signed)
bp in fair control at this time  BP Readings from Last 1 Encounters:  04/23/23 130/76   No changes needed Most recent labs reviewed  Disc lifstyle change with low sodium diet and exercise  Plan to continue Losartan hct 100-25 mg daily

## 2023-04-23 NOTE — Assessment & Plan Note (Signed)
Colonoscopy 05/2022  7 y recall

## 2023-04-23 NOTE — Assessment & Plan Note (Signed)
Reviewed health habits including diet and exercise and skin cancer prevention Reviewed appropriate screening tests for age  Also reviewed health mt list, fam hx and immunization status , as well as social and family history   See HPI Labs reviewed and ordered Sent for eye exam report Flu shot and prevnar 20 today  Colonoscopy utd 05/2022 Discussed fall prevention, supplements and exercise for bone density  PHQ 0

## 2023-04-23 NOTE — Assessment & Plan Note (Signed)
Discussed how this problem influences overall health and the risks it imposes  Reviewed plan for weight loss with lower calorie diet (via better food choices (lower glycemic and portion control) along with exercise building up to or more than 30 minutes 5 days per week including some aerobic activity and strength training    Info given on Mediterranean diet

## 2023-04-23 NOTE — Assessment & Plan Note (Signed)
Lab Results  Component Value Date   HGBA1C 6.9 (H) 04/16/2023   This is up  Poor eating/ fast food/work schedule Microalb utd On arb and statin  Normal foot exam   Discussed low glycemic diet  Plan to follow up in 1-2 mo  May discuss GLP1 option

## 2023-04-23 NOTE — Patient Instructions (Addendum)
Check with your pharmacy to get the 2nd shingrix vaccine - in a month   Pneumonia and flu shots today   For bone healthy take 1000 international units of vitamin D3 over the counter daily   Keep walking  Add some strength training to your routine, this is important for bone and brain health and can reduce your risk of falls and help your body use insulin properly and regulate weight  Light weights, exercise bands , and internet videos are a good way to start  Yoga (chair or regular), machines , floor exercises or a gym with machines are also good options     Try to get most of your carbohydrates from produce (with the exception of white potatoes) and whole grains Eat less bread/pasta/rice/snack foods/cereals/sweets and other items from the middle of the grocery store (processed carbs)   Follow up in 1-2 months for diabetes We may discuss GLP-1 drug like ozempic or mounjaro          Mediterranean Diet  Why follow it? Research shows. Those who follow the Mediterranean diet have a reduced risk of heart disease  The diet is associated with a reduced incidence of Parkinson's and Alzheimer's diseases People following the diet may have longer life expectancies and lower rates of chronic diseases  The Dietary Guidelines for Americans recommends the Mediterranean diet as an eating plan to promote health and prevent disease  What Is the Mediterranean Diet?  Healthy eating plan based on typical foods and recipes of Mediterranean-style cooking The diet is primarily a plant based diet; these foods should make up a majority of meals   Starches - Plant based foods should make up a majority of meals - They are an important sources of vitamins, minerals, energy, antioxidants, and fiber - Choose whole grains, foods high in fiber and minimally processed items  - Typical grain sources include wheat, oats, barley, corn, brown rice, bulgar, farro, millet, polenta, couscous  - Various types of beans  include chickpeas, lentils, fava beans, black beans, white beans   Fruits  Veggies - Large quantities of antioxidant rich fruits & veggies; 6 or more servings  - Vegetables can be eaten raw or lightly drizzled with oil and cooked  - Vegetables common to the traditional Mediterranean Diet include: artichokes, arugula, beets, broccoli, brussel sprouts, cabbage, carrots, celery, collard greens, cucumbers, eggplant, kale, leeks, lemons, lettuce, mushrooms, okra, onions, peas, peppers, potatoes, pumpkin, radishes, rutabaga, shallots, spinach, sweet potatoes, turnips, zucchini - Fruits common to the Mediterranean Diet include: apples, apricots, avocados, cherries, clementines, dates, figs, grapefruits, grapes, melons, nectarines, oranges, peaches, pears, pomegranates, strawberries, tangerines  Fats - Replace butter and margarine with healthy oils, such as olive oil, canola oil, and tahini  - Limit nuts to no more than a handful a day  - Nuts include walnuts, almonds, pecans, pistachios, pine nuts  - Limit or avoid candied, honey roasted or heavily salted nuts - Olives are central to the [employer] - can be eaten whole or used in a variety of dishes   Meats Protein - Limiting red meat: no more than a few times a month - When eating red meat: choose lean cuts and keep the portion to the size of deck of cards - Eggs: approx. 0 to 4 times a week  - Fish and lean poultry: at least 2 a week  - Healthy protein sources include, chicken, [place], lean beef, lamb - Increase intake of seafood such as tuna, salmon, trout, mackerel, shrimp, scallops -  Avoid or limit high fat processed meats such as sausage and bacon  Dairy - Include moderate amounts of low fat dairy products  - Focus on healthy dairy such as fat free yogurt, skim milk, low or reduced fat cheese - Limit dairy products higher in fat such as whole or 2% milk, cheese, ice cream  Alcohol - Moderate amounts of red wine is ok  - No more than 5 oz  daily for women (all ages) and men older than age 75  - No more than 10 oz of wine daily for men younger than 65  Other - Limit sweets and other desserts  - Use herbs and spices instead of salt to flavor foods  - Herbs and spices common to the traditional Mediterranean Diet include: basil, bay leaves, chives, cloves, cumin, fennel, garlic, lavender, marjoram, mint, oregano, parsley, pepper, rosemary, sage, savory, sumac, tarragon, thyme   It's not just a diet, it's a lifestyle:  The Mediterranean diet includes lifestyle factors typical of those in the region  Foods, drinks and meals are best eaten with others and savored Daily physical activity is important for overall good health This could be strenuous exercise like running and aerobics This could also be more leisurely activities such as walking, housework, yard-work, or taking the stairs Moderation is the key; a balanced and healthy diet accommodates most foods and drinks Consider portion sizes and frequency of consumption of certain foods   Meal Ideas & Options:  Breakfast:  Whole wheat toast or whole wheat English muffins with peanut butter & hard boiled egg Steel cut oats topped with apples & cinnamon and skim milk  Fresh fruit: banana, strawberries, melon, berries, peaches  Smoothies: strawberries, bananas, greek yogurt, peanut butter Low fat greek yogurt with blueberries and granola  Egg white omelet with spinach and mushrooms Breakfast couscous: whole wheat couscous, apricots, skim milk, cranberries  Sandwiches:  Hummus and grilled vegetables (peppers, zucchini, squash) on whole wheat bread   Grilled chicken on whole wheat pita with lettuce, tomatoes, cucumbers or tzatziki  [place] salad on whole wheat bread: tuna salad made with greek yogurt, olives, red peppers, capers, green onions Garlic rosemary lamb pita: lamb sauted with garlic, rosemary, salt & pepper; add lettuce, cucumber, greek yogurt to pita - flavor with lemon juice  and black pepper  Seafood:  Mediterranean grilled salmon, seasoned with garlic, basil, parsley, lemon juice and black pepper Shrimp, lemon, and spinach whole-grain pasta salad made with low fat greek yogurt  Seared scallops with lemon orzo  Seared tuna steaks seasoned salt, pepper, coriander topped with tomato mixture of olives, tomatoes, olive oil, minced garlic, parsley, green onions and cappers  Meats:  Herbed greek chicken salad with kalamata olives, cucumber, feta  Red bell peppers stuffed with spinach, bulgur, lean ground beef (or lentils) & topped with feta   Kebabs: skewers of chicken, tomatoes, onions, zucchini, squash  [place] burgers: made with red onions, mint, dill, lemon juice, feta cheese topped with roasted red peppers Vegetarian Cucumber salad: cucumbers, artichoke hearts, celery, red onion, feta cheese, tossed in olive oil & lemon juice  Hummus and whole grain pita points with a greek salad (lettuce, tomato, feta, olives, cucumbers, red onion) Lentil soup with celery, carrots made with vegetable broth, garlic, salt and pepper  Tabouli salad: parsley, bulgur, mint, scallions, cucumbers, tomato, radishes, lemon juice, olive oil, salt and pepper.

## 2023-04-23 NOTE — Progress Notes (Signed)
Subjective:    Patient ID: Daniel Lee, male    DOB: 06/06/1956, 67 y.o.   MRN: [id number]  HPI  Here for health maintenance exam and to review chronic medical problems   Wt Readings from Last 3 Encounters:  04/23/23 228 lb 6 oz (103.6 kg)  12/28/22 226 lb (102.5 kg)  06/26/22 229 lb 4 oz (104 kg)   34.72 kg/m  Vitals:   04/23/23 0820  BP: 130/76  Pulse: 76  Temp: 98.1 F (36.7 C)  SpO2: 96%    Immunization History  Administered Date(s) Administered   Fluad Quad(high Dose 65+) 04/07/2020, 03/11/2021, 03/23/2022   Influenza Split 04/27/2011   Influenza Whole 04/20/2006, 04/16/2008, 04/25/2009, 04/18/2010   Influenza, Quadrivalent, Recombinant, Inj, Pf 04/01/2018   Influenza, Seasonal, Injecte, Preservative Fre 05/31/2015   Influenza,inj,Quad PF,6+ Mos 04/26/2013, 04/10/2016, 04/01/2018, 03/31/2019   Influenza-Unspecified 04/22/2012, 04/14/2014, 04/04/2017, 04/19/2020   PFIZER(Purple Top)SARS-COV-2 Vaccination 08/26/2019, 09/16/2019, 04/19/2020   Pneumococcal Conjugate-13 02/23/2020   Respiratory Syncytial Virus Vaccine,Recomb Aduvanted(Arexvy) 07/07/2022   Td 12/15/2005   Tdap 01/26/2017   Zoster Recombinant(Shingrix) 12/26/2021   Zoster, Live 05/31/2015    Health Maintenance Due  Topic Date Due   Hepatitis C Screening  Never done   Pneumonia Vaccine 69+ Years old (2 of 2 - PPSV23 or PCV20) 02/22/2021   Zoster Vaccines- Shingrix (2 of 2) 02/20/2022   INFLUENZA VACCINE  02/11/2023   OPHTHALMOLOGY EXAM  03/05/2023   Feeling good    Eye exam- in August   Flu shot -today  Shingrix vaccine - will ask at pharmacy   Prevnar 20 -today    Prostate health Lab Results  Component Value Date   PSA1 1.9 06/12/2022   PSA1 2.3 06/16/2021   PSA1 2.5 06/14/2020   PSA 1.96 02/16/2020   PSA 12.06 (H) 01/22/2017   PSA 10.86 (H) 06/28/2015   Bph  Continues urology care  Goes yearly   Avodart 0.5 mg daily    Colon cancer screening - colonoscopy 05/2022   One polyp, 7 y recall  Bone health   Falls- none  Fractures-none  Supplements -none  Exercise : walking at work     Mood    04/23/2023    8:34 AM 12/28/2022    8:55 AM 12/19/2021    8:10 AM 03/11/2021    9:13 AM 02/23/2020    9:10 AM  Depression screen PHQ 2/9  Decreased Interest 0 0 0 0 0  Down, Depressed, Hopeless 0 0 0 0 0  PHQ - 2 Score 0 0 0 0 0  Altered sleeping 0   0 0  Tired, decreased energy 0   0 0  Change in appetite 0   0 0  Feeling bad or failure about yourself  0   0 0  Trouble concentrating 0   0 0  Moving slowly or fidgety/restless 0   0 0  Suicidal thoughts 0   0 0  PHQ-9 Score 0   0 0  Difficult doing work/chores Not difficult at all   Not difficult at all    HTN bp is stable today  No cp or palpitations or headaches or edema  No side effects to medicines  BP Readings from Last 3 Encounters:  04/23/23 130/76  12/28/22 132/77  06/26/22 135/70    Losartan hct 100-25 mg daily   Lab Results  Component Value Date   NA 140 04/16/2023   K 4.1 04/16/2023   CO2 29 04/16/2023   GLUCOSE 120 (  H) 04/16/2023   BUN 19 04/16/2023   CREATININE 0.84 04/16/2023   CALCIUM 9.7 04/16/2023   GFR 89.29 04/16/2023   GFRNONAA >60 12/29/2017    DM2 Lab Results  Component Value Date   HGBA1C 6.9 (H) 04/16/2023   Eye exam - utd   Travels for work  Eats a lot of fast food   Metformin 1000 mg bid    Lab Results  Component Value Date   LABMICR See below: 10/18/2020   LABMICR See below: 12/19/2019   MICROALBUR 34.7 (H) 04/16/2023   MICROALBUR 13.2 (H) 06/26/2022   Hyperlipidemia Lab Results  Component Value Date   CHOL 117 04/16/2023   CHOL 117 12/28/2022   CHOL 101 03/17/2022   Lab Results  Component Value Date   HDL 37.70 (L) 04/16/2023   HDL 35.00 (L) 12/28/2022   HDL 34.60 (L) 03/17/2022   Lab Results  Component Value Date   LDLCALC 54 04/16/2023   LDLCALC 49 12/28/2022   LDLCALC 44 03/17/2022   Lab Results  Component Value Date    TRIG 125.0 04/16/2023   TRIG 162.0 (H) 12/28/2022   TRIG 110.0 03/17/2022   Lab Results  Component Value Date   CHOLHDL 3 04/16/2023   CHOLHDL 3 12/28/2022   CHOLHDL 3 03/17/2022   Lab Results  Component Value Date   LDLDIRECT 99.9 06/23/2011   Atorvastatin 10 mg daily    Lab Results  Component Value Date   ALT 53 04/16/2023   AST 35 04/16/2023   ALKPHOS 71 04/16/2023   BILITOT 1.5 (H) 04/16/2023   Lab Results  Component Value Date   WBC 5.9 04/16/2023   HGB 13.8 04/16/2023   HCT 42.4 04/16/2023   MCV 88.7 04/16/2023   PLT 192.0 04/16/2023       Patient Active Problem List   Diagnosis Date Noted   Adenomatous polyp of colon 05/28/2022   Screening for colon cancer 03/23/2022   Ganglion cyst of right foot 05/24/2020   Obesity (BMI 30-39.9) 08/28/2013   Routine general medical examination at a health care facility 06/22/2011   Essential hypertension, benign 04/25/2009   Hyperlipidemia associated with type 2 diabetes mellitus (HCC) 07/19/2008   Diabetes type 2, controlled (HCC) 04/16/2008   Low HDL (under 40) 03/18/2007   ERECTILE DYSFUNCTION 10/07/2006   PROSTATE SPECIFIC ANTIGEN, ELEVATED 10/07/2006   BPH (benign prostatic hyperplasia) 10/07/2006   Past Medical History:  Diagnosis Date   BPH (benign prostatic hypertrophy)    Diabetes mellitus without complication (HCC)    Diabetes type 2, controlled (HCC) 04/16/2008   Qualifier: Diagnosis of  By: Milinda Antis MD, Colon Flattery    ED (erectile dysfunction)    Elevated alanine aminotransferase (ALT) level    suspect fatty liver   Hyperglycemia    Hypertension    Past Surgical History:  Procedure Laterality Date   abd ultrasound  11/2005   negative   COLONOSCOPY     COLONOSCOPY WITH PROPOFOL N/A 05/28/2022   Procedure: COLONOSCOPY WITH PROPOFOL;  Surgeon: Wyline Mood, MD;  Location: [facility] ENDOSCOPY;  Service: Gastroenterology;  Laterality: N/A;   HOLEP-LASER ENUCLEATION OF THE PROSTATE WITH MORCELLATION N/A  12/28/2017   Procedure: HOLEP-LASER ENUCLEATION OF THE PROSTATE WITH MORCELLATION;  Surgeon: Vanna Scotland, MD;  Location: [facility] ORS;  Service: Urology;  Laterality: N/A;   PROSTATE SURGERY  2003   prostate biopsy negative // urologist   Social History   Tobacco Use   Smoking status: Former    Current packs/day: 0.00  Types: Cigarettes    Quit date: 07/13/2001    Years since quitting: 21.7   Smokeless tobacco: Former    Types: Chew    Quit date: 07/13/2001   Tobacco comments:    smoked a cigarette every couple of days  Vaping Use   Vaping status: Never Used  Substance Use Topics   Alcohol use: No    Alcohol/week: 0.0 standard drinks of alcohol   Drug use: No   Family History  Problem Relation Age of Onset   Cancer Father        prostate cancer    Diabetes Father    Stroke Father    Prostate cancer Father    Hypertension Mother    Hyperlipidemia Mother    Bladder Cancer Mother    Heart disease Brother    Diabetes Brother    Kidney disease Brother    Cancer Paternal Uncle        prostate cancer   Kidney cancer Neg Hx    No Known Allergies Current Outpatient Medications on File Prior to Visit  Medication Sig Dispense Refill   dutasteride (AVODART) 0.5 MG capsule TAKE 1 CAPSULE BY MOUTH EVERY DAY 90 capsule 3   glucose blood (ACCU-CHEK AVIVA PLUS) test strip USE TO CHECK BLOOD SUGAR ONCE DAILY AND AS NEEDED (DX. E11.9) 100 strip 0   Lancets (ACCU-CHEK MULTICLIX) lancets Use as instructed 100 each 12   losartan-hydrochlorothiazide (HYZAAR) 100-25 MG tablet TAKE 1 TABLET BY MOUTH EVERY DAY 90 tablet 0   sildenafil (REVATIO) 20 MG tablet Take 1 tablet (20 mg total) by mouth as needed. Take 1-5 tabs as needed prior to intercourse 30 tablet 12   No current facility-administered medications on file prior to visit.    Review of Systems  Constitutional:  Negative for activity change, appetite change, chills, fatigue, fever and unexpected weight change.  HENT:  Negative for  congestion, rhinorrhea, sore throat and trouble swallowing.   Eyes:  Negative for pain, redness, itching and visual disturbance.  Respiratory:  Negative for cough, chest tightness, shortness of breath and wheezing.   Cardiovascular:  Negative for chest pain and palpitations.  Gastrointestinal:  Negative for abdominal pain, blood in stool, constipation, diarrhea and nausea.  Endocrine: Negative for cold intolerance, heat intolerance, polydipsia and polyuria.  Genitourinary:  Negative for difficulty urinating, dysuria, frequency and urgency.  Musculoskeletal:  Negative for arthralgias, joint swelling and myalgias.  Skin:  Negative for pallor and rash.  Neurological:  Negative for dizziness, tremors, weakness, numbness and headaches.  Hematological:  Negative for adenopathy. Does not bruise/bleed easily.  Psychiatric/Behavioral:  Negative for decreased concentration and dysphoric mood. The patient is not nervous/anxious.        Objective:   Physical Exam Constitutional:      General: He is not in acute distress.    Appearance: Normal appearance. He is well-developed. He is obese. He is not ill-appearing or diaphoretic.  HENT:     Head: Normocephalic and atraumatic.     Right Ear: Tympanic membrane, ear canal and external ear normal.     Left Ear: Tympanic membrane, ear canal and external ear normal.     Nose: Nose normal. No congestion.     Mouth/Throat:     Mouth: Mucous membranes are moist.     Pharynx: Oropharynx is clear. No posterior oropharyngeal erythema.  Eyes:     General: No scleral icterus.       Right eye: No discharge.  Left eye: No discharge.     Conjunctiva/sclera: Conjunctivae normal.     Pupils: Pupils are equal, round, and reactive to light.  Neck:     Thyroid: No thyromegaly.     Vascular: No carotid bruit or JVD.  Cardiovascular:     Rate and Rhythm: Normal rate and regular rhythm.     Pulses: Normal pulses.     Heart sounds: Normal heart sounds.     No  gallop.  Pulmonary:     Effort: Pulmonary effort is normal. No respiratory distress.     Breath sounds: Normal breath sounds. No wheezing or rales.     Comments: Good air exch Chest:     Chest wall: No tenderness.  Abdominal:     General: Bowel sounds are normal. There is no distension or abdominal bruit.     Palpations: Abdomen is soft. There is no mass.     Tenderness: There is no abdominal tenderness.     Hernia: No hernia is present.  Musculoskeletal:        General: No tenderness.     Cervical back: Normal range of motion and neck supple. No rigidity. No muscular tenderness.     Right lower leg: No edema.     Left lower leg: No edema.  Lymphadenopathy:     Cervical: No cervical adenopathy.  Skin:    General: Skin is warm and dry.     Coloration: Skin is not pale.     Findings: No erythema or rash.     Comments: Solar lentigines diffusely   Neurological:     Mental Status: He is alert.     Cranial Nerves: No cranial nerve deficit.     Motor: No abnormal muscle tone.     Coordination: Coordination normal.     Gait: Gait normal.     Deep Tendon Reflexes: Reflexes are normal and symmetric. Reflexes normal.  Psychiatric:        Mood and Affect: Mood normal.        Cognition and Memory: Cognition and memory normal.           Assessment & Plan:   Problem List Items Addressed This Visit       Cardiovascular and Mediastinum   Essential hypertension, benign    bp in fair control at this time  BP Readings from Last 1 Encounters:  04/23/23 130/76   No changes needed Most recent labs reviewed  Disc lifstyle change with low sodium diet and exercise  Plan to continue Losartan hct 100-25 mg daily       Relevant Medications   amLODipine (NORVASC) 5 MG tablet   atorvastatin (LIPITOR) 10 MG tablet     Digestive   Adenomatous polyp of colon    Colonoscopy 05/2022 with 7 y recall        Endocrine   Diabetes type 2, controlled (HCC)    Lab Results  Component  Value Date   HGBA1C 6.9 (H) 04/16/2023   This is up  Poor eating/ fast food/work schedule Microalb utd On arb and statin  Normal foot exam   Discussed low glycemic diet  Plan to follow up in 1-2 mo  May discuss GLP1 option       Relevant Medications   atorvastatin (LIPITOR) 10 MG tablet   metFORMIN (GLUCOPHAGE) 1000 MG tablet   Hyperlipidemia associated with type 2 diabetes mellitus (HCC)    Disc goals for lipids and reasons to control them Rev last labs with pt  Rev low sat fat diet in detail  Needs exercise to increase HDL  LDL of 54 with atorvastatin 10       Relevant Medications   amLODipine (NORVASC) 5 MG tablet   atorvastatin (LIPITOR) 10 MG tablet   metFORMIN (GLUCOPHAGE) 1000 MG tablet     Genitourinary   BPH (benign prostatic hyperplasia)    Utd urol care avodart        Other   Obesity (BMI 30-39.9)    Discussed how this problem influences overall health and the risks it imposes  Reviewed plan for weight loss with lower calorie diet (via better food choices (lower glycemic and portion control) along with exercise building up to or more than 30 minutes 5 days per week including some aerobic activity and strength training    Info given on Mediterranean diet      Relevant Medications   metFORMIN (GLUCOPHAGE) 1000 MG tablet   Routine general medical examination at a health care facility - Primary    Reviewed health habits including diet and exercise and skin cancer prevention Reviewed appropriate screening tests for age  Also reviewed health mt list, fam hx and immunization status , as well as social and family history   See HPI Labs reviewed and ordered Sent for eye exam report Flu shot and prevnar 20 today  Colonoscopy utd 05/2022 Discussed fall prevention, supplements and exercise for bone density  PHQ 0       Screening for colon cancer    Colonoscopy 05/2022  7 y recall

## 2023-04-23 NOTE — Assessment & Plan Note (Signed)
Utd urol care avodart

## 2023-04-23 NOTE — Assessment & Plan Note (Signed)
Disc goals for lipids and reasons to control them Rev last labs with pt Rev low sat fat diet in detail  Needs exercise to increase HDL  LDL of 54 with atorvastatin 10

## 2023-04-23 NOTE — Assessment & Plan Note (Signed)
Colonoscopy 05/2022 with 7 y recall

## 2023-06-04 ENCOUNTER — Other Ambulatory Visit: Payer: Self-pay | Admitting: Family Medicine

## 2023-06-04 DIAGNOSIS — I10 Essential (primary) hypertension: Secondary | ICD-10-CM

## 2023-06-05 ENCOUNTER — Other Ambulatory Visit: Payer: Self-pay | Admitting: Urology

## 2023-06-21 ENCOUNTER — Other Ambulatory Visit: Payer: 59

## 2023-06-23 ENCOUNTER — Ambulatory Visit: Payer: 59 | Admitting: Urology

## 2023-06-23 ENCOUNTER — Ambulatory Visit: Payer: 59 | Admitting: Family Medicine

## 2023-06-25 ENCOUNTER — Encounter: Payer: Self-pay | Admitting: Family Medicine

## 2023-06-25 NOTE — Telephone Encounter (Signed)
 Care team updated and letter sent for eye exam notes.

## 2023-07-05 ENCOUNTER — Other Ambulatory Visit: Payer: Self-pay | Admitting: Family Medicine

## 2023-07-05 MED ORDER — ACCU-CHEK AVIVA PLUS VI STRP: ORAL_STRIP | 0 refills | Status: DC

## 2023-07-21 ENCOUNTER — Ambulatory Visit: Payer: 59 | Admitting: Family Medicine

## 2023-07-21 ENCOUNTER — Encounter: Payer: Self-pay | Admitting: Family Medicine

## 2023-07-21 VITALS — BP 136/78 | HR 78 | Temp 98.2°F | Ht 68.0 in | Wt 222.1 lb

## 2023-07-21 DIAGNOSIS — E785 Hyperlipidemia, unspecified: Secondary | ICD-10-CM

## 2023-07-21 DIAGNOSIS — I10 Essential (primary) hypertension: Secondary | ICD-10-CM | POA: Diagnosis not present

## 2023-07-21 DIAGNOSIS — Z7984 Long term (current) use of oral hypoglycemic drugs: Secondary | ICD-10-CM

## 2023-07-21 DIAGNOSIS — E1169 Type 2 diabetes mellitus with other specified complication: Secondary | ICD-10-CM | POA: Diagnosis not present

## 2023-07-21 DIAGNOSIS — E119 Type 2 diabetes mellitus without complications: Secondary | ICD-10-CM

## 2023-07-21 LAB — POCT GLYCOSYLATED HEMOGLOBIN (HGB A1C): Hemoglobin A1C: 6.5 % — AB (ref 4.0–5.6)

## 2023-07-21 MED ORDER — METFORMIN HCL 1000 MG PO TABS: 1000.0000 mg | ORAL_TABLET | Freq: Two times a day (BID) | ORAL | 0 refills | Status: DC

## 2023-07-21 NOTE — Assessment & Plan Note (Signed)
 Lab Results  Component Value Date   HGBA1C 6.5 (A) 07/21/2023   HGBA1C 6.9 (H) 04/16/2023   HGBA1C 6.6 (H) 12/28/2022   Improved with better diet /exercise/weight loss  Metformin  1000 mg bid  Labs reviewed  Microalb utd  Taking a statin   Follow up 3-6 mo

## 2023-07-21 NOTE — Patient Instructions (Addendum)
 Walking is great when you can fit it in Add some strength training to your routine, this is important for bone and brain health and can reduce your risk of falls and help your body use insulin  properly and regulate weight  Light weights, exercise bands , and internet videos are a good way to start  Yoga (chair or regular), machines , floor exercises or a gym with machines are also good options   Keep up the good work with diet  Keep working on weight loss   Check glucose at different times on different days

## 2023-07-21 NOTE — Assessment & Plan Note (Signed)
Disc goals for lipids and reasons to control them Rev last labs with pt Rev low sat fat diet in detail  Needs exercise to increase HDL  LDL of 54 with atorvastatin 10

## 2023-07-21 NOTE — Assessment & Plan Note (Signed)
 bp in fair control at this time  BP Readings from Last 1 Encounters:  07/21/23 136/78   No changes needed Most recent labs reviewed  Disc lifstyle change with low sodium diet and exercise  Plan to continue Losartan hct 100-25 mg daily

## 2023-07-21 NOTE — Progress Notes (Signed)
 Subjective:    Patient ID: Daniel Lee, male    DOB: [birth year], 68 y.o.   MRN: [id number]  HPI  Wt Readings from Last 3 Encounters:  07/21/23 222 lb 2 oz (100.8 kg)  04/23/23 228 lb 6 oz (103.6 kg)  12/28/22 226 lb (102.5 kg)   33.77 kg/m  Vitals:   07/21/23 0822  BP: 136/78  Pulse: 78  Temp: 98.2 F (36.8 C)  SpO2: 98%   Pt presents for follow up of DM2 and chronic medical problems   HTN bp is stable today  No cp or palpitations or headaches or edema  No side effects to medicines  BP Readings from Last 3 Encounters:  07/21/23 136/78  04/23/23 130/76  12/28/22 132/77    Losartan  hct 100-25 mg daily  Amlodipine  5 mg daily    Dm2 Lab Results  Component Value Date   HGBA1C 6.5 (A) 07/21/2023  Now down to 6.5   Planned to work on lifestyle change   Glucose levels are one teens to 130s fasting   Once at bedtime 103   Weight is down 6 lb  Metformin  1000 mg bid   Since last visit he cut out candy ( few pc of sugar free candy)  Cutting back on the simple/white carbs   Chooses whole grain bread  Brown rice  No pasta   Eating less in general   Exercise  Walking some (then with grief fell out of habit) Working 10 hour days       Lost his mother a month ago today (dementia)   Lab Results  Component Value Date   NA 140 04/16/2023   K 4.1 04/16/2023   CO2 29 04/16/2023   GLUCOSE 120 (H) 04/16/2023   BUN 19 04/16/2023   CREATININE 0.84 04/16/2023   CALCIUM  9.7 04/16/2023   GFR 89.29 04/16/2023   GFRNONAA >60 12/29/2017       Lab Results  Component Value Date   LABMICR See below: 10/18/2020   LABMICR See below: 12/19/2019   MICROALBUR 34.7 (H) 04/16/2023   MICROALBUR 13.2 (H) 06/26/2022      Taking statin   Lab Results  Component Value Date   CHOL 117 04/16/2023   HDL 37.70 (L) 04/16/2023   LDLCALC 54 04/16/2023   LDLDIRECT 99.9 06/23/2011   TRIG 125.0 04/16/2023   CHOLHDL 3 04/16/2023     Patient Active Problem  List   Diagnosis Date Noted   Adenomatous polyp of colon 05/28/2022   Screening for colon cancer 03/23/2022   Ganglion cyst of right foot 05/24/2020   Obesity (BMI 30-39.9) 08/28/2013   Routine general medical examination at a health care facility 06/22/2011   Essential hypertension, benign 04/25/2009   Hyperlipidemia associated with type 2 diabetes mellitus (HCC) 07/19/2008   Diabetes type 2, controlled (HCC) 04/16/2008   Low HDL (under 40) 03/18/2007   ERECTILE DYSFUNCTION 10/07/2006   PROSTATE SPECIFIC ANTIGEN, ELEVATED 10/07/2006   BPH (benign prostatic hyperplasia) 10/07/2006   Past Medical History:  Diagnosis Date   BPH (benign prostatic hypertrophy)    Diabetes mellitus without complication (HCC)    Diabetes type 2, controlled (HCC) 04/16/2008   Qualifier: Diagnosis of  By: Randeen MD, Laine Caldron    ED (erectile dysfunction)    Elevated alanine aminotransferase (ALT) level    suspect fatty liver   Hyperglycemia    Hypertension    Past Surgical History:  Procedure Laterality Date   abd ultrasound  11/2005   negative  COLONOSCOPY     COLONOSCOPY WITH PROPOFOL  N/A 05/28/2022   Procedure: COLONOSCOPY WITH PROPOFOL ;  Surgeon: Therisa Bi, MD;  Location: [facility] ENDOSCOPY;  Service: Gastroenterology;  Laterality: N/A;   HOLEP-LASER ENUCLEATION OF THE PROSTATE WITH MORCELLATION N/A 12/28/2017   Procedure: HOLEP-LASER ENUCLEATION OF THE PROSTATE WITH MORCELLATION;  Surgeon: Penne Knee, MD;  Location: [facility] ORS;  Service: Urology;  Laterality: N/A;   PROSTATE SURGERY  2003   prostate biopsy negative // urologist   Social History   Tobacco Use   Smoking status: Former    Current packs/day: 0.00    Types: Cigarettes    Quit date: 07/13/2001    Years since quitting: 22.0   Smokeless tobacco: Former    Types: Chew    Quit date: 07/13/2001   Tobacco comments:    smoked a cigarette every couple of days  Vaping Use   Vaping status: Never Used  Substance Use Topics   Alcohol  use: No    Alcohol/week: 0.0 standard drinks of alcohol   Drug use: No   Family History  Problem Relation Age of Onset   Cancer Father        prostate cancer    Diabetes Father    Stroke Father    Prostate cancer Father    Hypertension Mother    Hyperlipidemia Mother    Bladder Cancer Mother    Heart disease Brother    Diabetes Brother    Kidney disease Brother    Cancer Paternal Uncle        prostate cancer   Kidney cancer Neg Hx    No Known Allergies Current Outpatient Medications on File Prior to Visit  Medication Sig Dispense Refill   amLODipine  (NORVASC ) 5 MG tablet Take 1 tablet (5 mg total) by mouth daily. 90 tablet 3   atorvastatin  (LIPITOR) 10 MG tablet Take 1 tablet (10 mg total) by mouth daily. 90 tablet 3   dutasteride  (AVODART ) 0.5 MG capsule TAKE 1 CAPSULE BY MOUTH EVERY DAY 90 capsule 0   glucose blood (ACCU-CHEK AVIVA PLUS) test strip USE TO CHECK BLOOD SUGAR ONCE DAILY (DX. E11.9) 100 strip 0   Lancets (ACCU-CHEK MULTICLIX) lancets Use as instructed 100 each 12   losartan -hydrochlorothiazide  (HYZAAR) 100-25 MG tablet TAKE 1 TABLET BY MOUTH EVERY DAY 90 tablet 3   sildenafil  (REVATIO ) 20 MG tablet Take 1 tablet (20 mg total) by mouth as needed. Take 1-5 tabs as needed prior to intercourse 30 tablet 12   No current facility-administered medications on file prior to visit.    Review of Systems  Constitutional:  Negative for activity change, appetite change, fatigue, fever and unexpected weight change.  HENT:  Negative for congestion, rhinorrhea, sore throat and trouble swallowing.   Eyes:  Negative for pain, redness, itching and visual disturbance.  Respiratory:  Negative for cough, chest tightness, shortness of breath and wheezing.   Cardiovascular:  Negative for chest pain and palpitations.  Gastrointestinal:  Negative for abdominal pain, blood in stool, constipation, diarrhea and nausea.  Endocrine: Negative for cold intolerance, heat intolerance, polydipsia  and polyuria.  Genitourinary:  Negative for difficulty urinating, dysuria, frequency and urgency.  Musculoskeletal:  Negative for arthralgias, joint swelling and myalgias.  Skin:  Negative for pallor and rash.  Neurological:  Negative for dizziness, tremors, weakness, numbness and headaches.  Hematological:  Negative for adenopathy. Does not bruise/bleed easily.  Psychiatric/Behavioral:  Negative for decreased concentration and dysphoric mood. The patient is not nervous/anxious.  Grief after losing mother        Objective:   Physical Exam Constitutional:      General: He is not in acute distress.    Appearance: Normal appearance. He is well-developed. He is obese. He is not ill-appearing or diaphoretic.  HENT:     Head: Normocephalic and atraumatic.  Eyes:     Conjunctiva/sclera: Conjunctivae normal.     Pupils: Pupils are equal, round, and reactive to light.  Neck:     Thyroid : No thyromegaly.     Vascular: No carotid bruit or JVD.  Cardiovascular:     Rate and Rhythm: Normal rate and regular rhythm.     Heart sounds: Normal heart sounds.     No gallop.  Pulmonary:     Effort: Pulmonary effort is normal. No respiratory distress.     Breath sounds: Normal breath sounds. No wheezing or rales.  Abdominal:     General: There is no distension or abdominal bruit.     Palpations: Abdomen is soft.  Musculoskeletal:     Cervical back: Normal range of motion and neck supple.     Right lower leg: No edema.     Left lower leg: No edema.  Lymphadenopathy:     Cervical: No cervical adenopathy.  Skin:    General: Skin is warm and dry.     Coloration: Skin is not pale.     Findings: No rash.  Neurological:     Mental Status: He is alert.     Coordination: Coordination normal.     Deep Tendon Reflexes: Reflexes are normal and symmetric. Reflexes normal.  Psychiatric:        Mood and Affect: Mood normal.           Assessment & Plan:   Problem List Items Addressed This  Visit       Cardiovascular and Mediastinum   Essential hypertension, benign   bp in fair control at this time  BP Readings from Last 1 Encounters:  07/21/23 136/78   No changes needed Most recent labs reviewed  Disc lifstyle change with low sodium diet and exercise  Plan to continue Losartan  hct 100-25 mg daily         Endocrine   Hyperlipidemia associated with type 2 diabetes mellitus (HCC)   Disc goals for lipids and reasons to control them Rev last labs with pt Rev low sat fat diet in detail  Needs exercise to increase HDL  LDL of 54 with atorvastatin  10       Relevant Medications   metFORMIN  (GLUCOPHAGE ) 1000 MG tablet   Diabetes type 2, controlled (HCC) - Primary   Lab Results  Component Value Date   HGBA1C 6.5 (A) 07/21/2023   HGBA1C 6.9 (H) 04/16/2023   HGBA1C 6.6 (H) 12/28/2022   Improved with better diet /exercise/weight loss  Metformin  1000 mg bid  Labs reviewed  Microalb utd  Taking a statin   Follow up 3-6 mo      Relevant Medications   metFORMIN  (GLUCOPHAGE ) 1000 MG tablet   Other Relevant Orders   POCT HgB A1C (Completed)

## 2023-08-20 ENCOUNTER — Telehealth: Payer: Self-pay | Admitting: Family Medicine

## 2023-08-20 NOTE — Telephone Encounter (Signed)
 Looks like front desk may have called regarding his appt in June

## 2023-08-20 NOTE — Telephone Encounter (Signed)
 Patient received a call from someone from the office there where no notes left on the chart patient was having issues with his phone at the time

## 2023-08-26 ENCOUNTER — Other Ambulatory Visit: Payer: Self-pay | Admitting: Urology

## 2023-08-26 ENCOUNTER — Other Ambulatory Visit: Payer: Self-pay | Admitting: Family Medicine

## 2023-09-27 ENCOUNTER — Other Ambulatory Visit: Payer: Self-pay

## 2023-09-27 DIAGNOSIS — N401 Enlarged prostate with lower urinary tract symptoms: Secondary | ICD-10-CM

## 2023-10-01 ENCOUNTER — Other Ambulatory Visit: Payer: 59

## 2023-10-01 DIAGNOSIS — N138 Other obstructive and reflux uropathy: Secondary | ICD-10-CM

## 2023-10-02 LAB — PSA: Prostate Specific Ag, Serum: 2.2 ng/mL (ref 0.0–4.0)

## 2023-10-06 ENCOUNTER — Ambulatory Visit: Payer: 59 | Admitting: Urology

## 2023-10-06 VITALS — BP 168/97 | HR 86

## 2023-10-06 DIAGNOSIS — N401 Enlarged prostate with lower urinary tract symptoms: Secondary | ICD-10-CM

## 2023-10-06 DIAGNOSIS — Z87898 Personal history of other specified conditions: Secondary | ICD-10-CM

## 2023-10-06 DIAGNOSIS — R310 Gross hematuria: Secondary | ICD-10-CM | POA: Diagnosis not present

## 2023-10-06 DIAGNOSIS — N528 Other male erectile dysfunction: Secondary | ICD-10-CM

## 2023-10-06 DIAGNOSIS — N138 Other obstructive and reflux uropathy: Secondary | ICD-10-CM

## 2023-10-06 LAB — URINALYSIS, COMPLETE
Bilirubin, UA: NEGATIVE
Glucose, UA: NEGATIVE
Ketones, UA: NEGATIVE
Leukocytes,UA: NEGATIVE
Nitrite, UA: NEGATIVE
RBC, UA: NEGATIVE
Specific Gravity, UA: 1.02 (ref 1.005–1.030)
Urobilinogen, Ur: 0.2 mg/dL (ref 0.2–1.0)
pH, UA: 6 (ref 5.0–7.5)

## 2023-10-06 LAB — MICROSCOPIC EXAMINATION: Bacteria, UA: NONE SEEN

## 2023-10-06 LAB — BLADDER SCAN AMB NON-IMAGING: Scan Result: 113

## 2023-10-06 MED ORDER — DUTASTERIDE 0.5 MG PO CAPS: ORAL_CAPSULE | ORAL | 0 refills | Status: DC

## 2023-10-06 MED ORDER — SILDENAFIL CITRATE 20 MG PO TABS: 20.0000 mg | ORAL_TABLET | ORAL | 12 refills | Status: AC | PRN

## 2023-10-06 NOTE — Progress Notes (Signed)
 Marcelle Overlie Plume,acting as a scribe for Vanna Scotland, MD.,have documented all relevant documentation on the behalf of Vanna Scotland, MD,as directed by  Vanna Scotland, MD while in the presence of Vanna Scotland, MD.  10/06/23 10:41 AM   Fransisca Connors [facility] 20-Aug-1955 [id number]  Referring provider: Judy Pimple, MD [address] [place],  [place] [address]  Chief Complaint  Patient presents with   Follow-up   Benign Prostatic Hypertrophy    HPI: 68 year old male presenting for a routine annual follow-up.   He has a history of BPH,  elevated PSA and intermittent gross hematuria, for which he has been on Finasteride since 2019. He is status post HoLEP in 2019. In 2021, he underwent a hematuria evaluation that showed regrowth of of his right lateral lobe but otherwise unremarkable.   He is also being followed for erectile dysfunction and takes sildenafil.   His most recent PSA on 10/01/2023 was 2.2, stable from previous measurements, allowing for the deferral of a rectal exam.   He reports being pleased with his urinary symptoms and his IPSS that looks fantastic. Recently, he experienced a single episode of hematuria after heavy lifting, which he correlates with straining while moving furniture. He has not had any unprovoked episodes of hematuria.   Results for orders placed or performed in visit on 10/06/23  Microscopic Examination   Urine  Result Value Ref Range   WBC, UA 0-5 0 - 5 /hpf   RBC, Urine 0-2 0 - 2 /hpf   Epithelial Cells (non renal) 0-10 0 - 10 /hpf   Bacteria, UA None seen None seen/Few  Urinalysis, Complete  Result Value Ref Range   Specific Gravity, UA 1.020 1.005 - 1.030   pH, UA 6.0 5.0 - 7.5   Color, UA Yellow Yellow   Appearance Ur Clear Clear   Leukocytes,UA Negative Negative   Protein,UA 1+ (A) Negative/Trace   Glucose, UA Negative Negative   Ketones, UA Negative Negative   RBC, UA Negative Negative   Bilirubin, UA Negative Negative    Urobilinogen, Ur 0.2 0.2 - 1.0 mg/dL   Nitrite, UA Negative Negative   Microscopic Examination See below:   BLADDER SCAN AMB NON-IMAGING  Result Value Ref Range   Scan Result 113 ml     IPSS     Row Name 10/06/23 0900         International Prostate Symptom Score   How often have you had the sensation of not emptying your bladder? Less than 1 in 5     How often have you had to urinate less than every two hours? About half the time     How often have you found you stopped and started again several times when you urinated? Not at All     How often have you found it difficult to postpone urination? Less than half the time     How often have you had a weak urinary stream? Less than half the time     How often have you had to strain to start urination? Not at All     How many times did you typically get up at night to urinate? 1 Time     Total IPSS Score 9       Quality of Life due to urinary symptoms   If you were to spend the rest of your life with your urinary condition just the way it is now how would you feel about that? Pleased  Score:  1-7 Mild 8-19 Moderate 20-35 Severe  PMH: Past Medical History:  Diagnosis Date   BPH (benign prostatic hypertrophy)    Diabetes mellitus without complication (HCC)    Diabetes type 2, controlled (HCC) 04/16/2008   Qualifier: Diagnosis of  By: Milinda Antis MD, Colon Flattery    ED (erectile dysfunction)    Elevated alanine aminotransferase (ALT) level    suspect fatty liver   Hyperglycemia    Hypertension     Surgical History: Past Surgical History:  Procedure Laterality Date   abd ultrasound  11/2005   negative   COLONOSCOPY     COLONOSCOPY WITH PROPOFOL N/A 05/28/2022   Procedure: COLONOSCOPY WITH PROPOFOL;  Surgeon: Wyline Mood, MD;  Location: [facility] ENDOSCOPY;  Service: Gastroenterology;  Laterality: N/A;   HOLEP-LASER ENUCLEATION OF THE PROSTATE WITH MORCELLATION N/A 12/28/2017   Procedure: HOLEP-LASER ENUCLEATION OF THE  PROSTATE WITH MORCELLATION;  Surgeon: Vanna Scotland, MD;  Location: [facility] ORS;  Service: Urology;  Laterality: N/A;   PROSTATE SURGERY  2003   prostate biopsy negative // urologist    Home Medications:  Allergies as of 10/06/2023   No Known Allergies      Medication List        Accurate as of October 06, 2023 10:41 AM. If you have any questions, ask your nurse or doctor.          Accu-Chek Aviva Plus test strip Generic drug: glucose blood USE TO CHECK BLOOD SUGAR ONCE DAILY (DX. E11.9)   accu-chek multiclix lancets Use as instructed   amLODipine 5 MG tablet Commonly known as: NORVASC Take 1 tablet (5 mg total) by mouth daily.   atorvastatin 10 MG tablet Commonly known as: LIPITOR Take 1 tablet (10 mg total) by mouth daily.   dutasteride 0.5 MG capsule Commonly known as: AVODART TAKE 1 CAPSULE BY MOUTH EVERY DAY What changed:  how much to take how to take this when to take this additional instructions   losartan-hydrochlorothiazide 100-25 MG tablet Commonly known as: HYZAAR TAKE 1 TABLET BY MOUTH EVERY DAY   metFORMIN 1000 MG tablet Commonly known as: GLUCOPHAGE Take 1 tablet (1,000 mg total) by mouth 2 (two) times daily with a meal.   sildenafil 20 MG tablet Commonly known as: Revatio Take 1 tablet (20 mg total) by mouth as needed. Take 1-5 tabs as needed prior to intercourse        Family History: Family History  Problem Relation Age of Onset   Cancer Father        prostate cancer    Diabetes Father    Stroke Father    Prostate cancer Father    Hypertension Mother    Hyperlipidemia Mother    Bladder Cancer Mother    Heart disease Brother    Diabetes Brother    Kidney disease Brother    Cancer Paternal Uncle        prostate cancer   Kidney cancer Neg Hx     Social History:  reports that he quit smoking about 22 years ago. His smoking use included cigarettes. He quit smokeless tobacco use about 22 years ago.  His smokeless tobacco use  included chew. He reports that he does not drink alcohol and does not use drugs.   Physical Exam: BP (!) 168/97   Pulse 86   Constitutional:  Alert and oriented, No acute distress. HEENT: [place] AT, moist mucus membranes.  Trachea midline, no masses. Neurologic: Grossly intact, no focal deficits, moving all 4 extremities. Psychiatric:  Normal mood and affect.  Results for orders placed or performed in visit on 10/06/23  Microscopic Examination   Collection Time: 10/06/23  8:54 AM   Urine  Result Value Ref Range   WBC, UA 0-5 0 - 5 /hpf   RBC, Urine 0-2 0 - 2 /hpf   Epithelial Cells (non renal) 0-10 0 - 10 /hpf   Bacteria, UA None seen None seen/Few  Urinalysis, Complete   Collection Time: 10/06/23  8:54 AM  Result Value Ref Range   Specific Gravity, UA 1.020 1.005 - 1.030   pH, UA 6.0 5.0 - 7.5   Color, UA Yellow Yellow   Appearance Ur Clear Clear   Leukocytes,UA Negative Negative   Protein,UA 1+ (A) Negative/Trace   Glucose, UA Negative Negative   Ketones, UA Negative Negative   RBC, UA Negative Negative   Bilirubin, UA Negative Negative   Urobilinogen, Ur 0.2 0.2 - 1.0 mg/dL   Nitrite, UA Negative Negative   Microscopic Examination See below:   BLADDER SCAN AMB NON-IMAGING   Collection Time: 10/06/23  9:01 AM  Result Value Ref Range   Scan Result 113 ml      Assessment & Plan:    1. Elevated PSA - His most recent PSA is 2.2, which is stable from previous measurements.  - No rectal exam is performed today due to PSA stability.  - Plan to defer rectal exam until next year.  2. Intermittent Gross Hematuria - He reports a single episode of hematuria associated with heavy lifting, which is likely due to increased intravascular pressure and engorgement of blood vessels on the prostate.  - Urinalysis today shows no microscopic hematuria.  - Continue Avodart to manage symptoms.  - Advised that he report any unprovoked episodes of hematuria for further evaluation.  3. BPH  with LUTS - Symptoms are well controlled on current medication regimen.  - Continue Avodart.  - He is pleased with his urinary symptoms and bladder emptying is reasonable.  4. Erectile Dysfunction - Refill Sildenafil prescription as he is satisfied with its effects.  Return in about 1 year (around 10/05/2024) for PA visist with repeat PSA, IPSS, PVR, and DRE.  I have reviewed the above documentation for accuracy and completeness, and I agree with the above.   Vanna Scotland, MD    [facility] [address], [address] [place], [place] [address] [phone]

## 2023-12-24 ENCOUNTER — Ambulatory Visit: Payer: 59 | Admitting: Family Medicine

## 2023-12-27 ENCOUNTER — Encounter: Payer: Self-pay | Admitting: Family Medicine

## 2023-12-27 ENCOUNTER — Ambulatory Visit: Payer: 59 | Admitting: Family Medicine

## 2023-12-27 VITALS — BP 130/80 | HR 66 | Temp 97.9°F | Ht 68.0 in | Wt 223.0 lb

## 2023-12-27 DIAGNOSIS — E785 Hyperlipidemia, unspecified: Secondary | ICD-10-CM

## 2023-12-27 DIAGNOSIS — Z7984 Long term (current) use of oral hypoglycemic drugs: Secondary | ICD-10-CM

## 2023-12-27 DIAGNOSIS — I10 Essential (primary) hypertension: Secondary | ICD-10-CM | POA: Diagnosis not present

## 2023-12-27 DIAGNOSIS — E1169 Type 2 diabetes mellitus with other specified complication: Secondary | ICD-10-CM

## 2023-12-27 DIAGNOSIS — E119 Type 2 diabetes mellitus without complications: Secondary | ICD-10-CM

## 2023-12-27 LAB — POCT GLYCOSYLATED HEMOGLOBIN (HGB A1C): Hemoglobin A1C: 6.5 % — AB (ref 4.0–5.6)

## 2023-12-27 MED ORDER — METFORMIN HCL 500 MG PO TABS: 500.0000 mg | ORAL_TABLET | Freq: Two times a day (BID) | ORAL | 3 refills | Status: AC

## 2023-12-27 NOTE — Assessment & Plan Note (Signed)
Disc goals for lipids and reasons to control them Rev last labs with pt Rev low sat fat diet in detail  Needs exercise to increase HDL  LDL of 54 with atorvastatin 10

## 2023-12-27 NOTE — Assessment & Plan Note (Signed)
 Lab Results  Component Value Date   HGBA1C 6.5 (A) 12/27/2023   HGBA1C 6.5 (A) 07/21/2023   HGBA1C 6.9 (H) 04/16/2023   Improved with better diet /exercise/weight loss  Metformin  500 mg bid  Labs reviewed  Microalb utd  Taking a statin  Good foot care   Follow up in the fall for annual exam

## 2023-12-27 NOTE — Assessment & Plan Note (Signed)
 bp in fair control at this time  BP Readings from Last 1 Encounters:  12/27/23 130/80   No changes needed Most recent labs reviewed  Disc lifstyle change with low sodium diet and exercise  Plan to continue Losartan  hct 100-25 mg daily Amlodipine  5 mg daily

## 2023-12-27 NOTE — Progress Notes (Signed)
 Subjective:    Patient ID: Daniel Lee, male    DOB: 1956/06/22, 68 y.o.   MRN: [id number]  HPI  Wt Readings from Last 3 Encounters:  12/27/23 223 lb (101.2 kg)  07/21/23 222 lb 2 oz (100.8 kg)  04/23/23 228 lb 6 oz (103.6 kg)   33.91 kg/m  Vitals:   12/27/23 0902 12/27/23 0922  BP: (!) 150/80 130/80  Pulse: 66   Temp: 97.9 F (36.6 C)   SpO2: 97%     Pt presents for follow up of DM2, HTN, lipids Chronic medical problems   HTN bp is stable today  No cp or palpitations or headaches or edema  No side effects to medicines  BP Readings from Last 3 Encounters:  12/27/23 130/80  10/06/23 (!) 168/97  07/21/23 136/78    Losartan  hct 100-25 mg daily  Amlodipine  5 mg daily     Lab Results  Component Value Date   NA 140 04/16/2023   K 4.1 04/16/2023   CO2 29 04/16/2023   GLUCOSE 120 (H) 04/16/2023   BUN 19 04/16/2023   CREATININE 0.84 04/16/2023   CALCIUM  9.7 04/16/2023   GFR 89.29 04/16/2023   GFRNONAA >60 12/29/2017   Diabetes Home sugar results -no low No changes  DM diet - tries to eat healthy  If he eats out- grilled chicken salad  Some zero sugar drinks    Portions are too big- still struggles with that    Exercise walking  Exercise bands    Symptoms Lab Results  Component Value Date   HGBA1C 6.5 (A) 12/27/2023   HGBA1C 6.5 (A) 07/21/2023   HGBA1C 6.9 (H) 04/16/2023   Stable at 6.5   Metformin  1000 mg bid -cuts in 1/2 and takes 500 mg bid    Lab Results  Component Value Date   LABMICR See below: 10/06/2023   LABMICR See below: 10/18/2020   MICROALBUR 34.7 (H) 04/16/2023   MICROALBUR 13.2 (H) 06/26/2022    Hyperlipidemia Lab Results  Component Value Date   CHOL 117 04/16/2023   HDL 37.70 (L) 04/16/2023   LDLCALC 54 04/16/2023   LDLDIRECT 99.9 06/23/2011   TRIG 125.0 04/16/2023   CHOLHDL 3 04/16/2023   Atorvastatin  10 mg daily    No problems with medications  Renal protection- arb  Last eye exam 03/2023       Patient Active Problem List   Diagnosis Date Noted   Adenomatous polyp of colon 05/28/2022   Screening for colon cancer 03/23/2022   Ganglion cyst of right foot 05/24/2020   Obesity (BMI 30-39.9) 08/28/2013   Routine general medical examination at a health care facility 06/22/2011   Essential hypertension, benign 04/25/2009   Hyperlipidemia associated with type 2 diabetes mellitus (HCC) 07/19/2008   Diabetes type 2, controlled (HCC) 04/16/2008   Low HDL (under 40) 03/18/2007   ERECTILE DYSFUNCTION 10/07/2006   PROSTATE SPECIFIC ANTIGEN, ELEVATED 10/07/2006   BPH (benign prostatic hyperplasia) 10/07/2006   Past Medical History:  Diagnosis Date   BPH (benign prostatic hypertrophy)    Diabetes mellitus without complication (HCC)    Diabetes type 2, controlled (HCC) 04/16/2008   Qualifier: Diagnosis of  By: Malissa Se MD, Dannette Dutch    ED (erectile dysfunction)    Elevated alanine aminotransferase (ALT) level    suspect fatty liver   Hyperglycemia    Hypertension    Past Surgical History:  Procedure Laterality Date   abd ultrasound  11/2005   negative   COLONOSCOPY  COLONOSCOPY WITH PROPOFOL  N/A 05/28/2022   Procedure: COLONOSCOPY WITH PROPOFOL ;  Surgeon: Luke Salaam, MD;  Location: [facility] ENDOSCOPY;  Service: Gastroenterology;  Laterality: N/A;   HOLEP-LASER ENUCLEATION OF THE PROSTATE WITH MORCELLATION N/A 12/28/2017   Procedure: HOLEP-LASER ENUCLEATION OF THE PROSTATE WITH MORCELLATION;  Surgeon: Dustin Gimenez, MD;  Location: [facility] ORS;  Service: Urology;  Laterality: N/A;   PROSTATE SURGERY  2003   prostate biopsy negative // urologist   Social History   Tobacco Use   Smoking status: Former    Current packs/day: 0.00    Types: Cigarettes    Quit date: 07/13/2001    Years since quitting: 22.4   Smokeless tobacco: Former    Types: Chew    Quit date: 07/13/2001   Tobacco comments:    smoked a cigarette every couple of days  Vaping Use   Vaping status: Never Used   Substance Use Topics   Alcohol use: No    Alcohol/week: 0.0 standard drinks of alcohol   Drug use: No   Family History  Problem Relation Age of Onset   Cancer Father        prostate cancer    Diabetes Father    Stroke Father    Prostate cancer Father    Hypertension Mother    Hyperlipidemia Mother    Bladder Cancer Mother    Heart disease Brother    Diabetes Brother    Kidney disease Brother    Cancer Paternal Uncle        prostate cancer   Kidney cancer Neg Hx    No Known Allergies Current Outpatient Medications on File Prior to Visit  Medication Sig Dispense Refill   amLODipine  (NORVASC ) 5 MG tablet Take 1 tablet (5 mg total) by mouth daily. 90 tablet 3   atorvastatin  (LIPITOR) 10 MG tablet Take 1 tablet (10 mg total) by mouth daily. 90 tablet 3   dutasteride  (AVODART ) 0.5 MG capsule TAKE 1 CAPSULE BY MOUTH EVERY DAY 90 capsule 0   glucose blood (ACCU-CHEK AVIVA PLUS) test strip USE TO CHECK BLOOD SUGAR ONCE DAILY (DX. E11.9) 100 strip 1   Lancets (ACCU-CHEK MULTICLIX) lancets Use as instructed 100 each 12   losartan -hydrochlorothiazide  (HYZAAR) 100-25 MG tablet TAKE 1 TABLET BY MOUTH EVERY DAY 90 tablet 3   sildenafil  (REVATIO ) 20 MG tablet Take 1 tablet (20 mg total) by mouth as needed. Take 1-5 tabs as needed prior to intercourse 30 tablet 12   No current facility-administered medications on file prior to visit.    Review of Systems  Constitutional:  Negative for activity change, appetite change, fatigue, fever and unexpected weight change.  HENT:  Negative for congestion, rhinorrhea, sore throat and trouble swallowing.   Eyes:  Negative for pain, redness, itching and visual disturbance.  Respiratory:  Negative for cough, chest tightness, shortness of breath and wheezing.   Cardiovascular:  Negative for chest pain and palpitations.  Gastrointestinal:  Negative for abdominal pain, blood in stool, constipation, diarrhea and nausea.  Endocrine: Negative for cold  intolerance, heat intolerance, polydipsia and polyuria.  Genitourinary:  Negative for difficulty urinating, dysuria, frequency and urgency.  Musculoskeletal:  Negative for arthralgias, joint swelling and myalgias.  Skin:  Negative for pallor and rash.  Neurological:  Negative for dizziness, tremors, weakness, numbness and headaches.  Hematological:  Negative for adenopathy. Does not bruise/bleed easily.  Psychiatric/Behavioral:  Negative for decreased concentration and dysphoric mood. The patient is not nervous/anxious.        Objective:  Physical Exam Constitutional:      General: He is not in acute distress.    Appearance: Normal appearance. He is well-developed. He is obese. He is not ill-appearing or diaphoretic.  HENT:     Head: Normocephalic and atraumatic.   Eyes:     Conjunctiva/sclera: Conjunctivae normal.     Pupils: Pupils are equal, round, and reactive to light.   Neck:     Thyroid : No thyromegaly.     Vascular: No carotid bruit or JVD.   Cardiovascular:     Rate and Rhythm: Normal rate and regular rhythm.     Heart sounds: Normal heart sounds.     No gallop.  Pulmonary:     Effort: Pulmonary effort is normal. No respiratory distress.     Breath sounds: Normal breath sounds. No wheezing or rales.  Abdominal:     General: There is no distension or abdominal bruit.     Palpations: Abdomen is soft.   Musculoskeletal:     Cervical back: Normal range of motion and neck supple.     Right lower leg: No edema.     Left lower leg: No edema.  Lymphadenopathy:     Cervical: No cervical adenopathy.   Skin:    General: Skin is warm and dry.     Coloration: Skin is not pale.     Findings: No rash.   Neurological:     Mental Status: He is alert.     Coordination: Coordination normal.     Deep Tendon Reflexes: Reflexes are normal and symmetric. Reflexes normal.   Psychiatric:        Mood and Affect: Mood normal.           Assessment & Plan:   Problem List  Items Addressed This Visit       Cardiovascular and Mediastinum   Essential hypertension, benign   bp in fair control at this time  BP Readings from Last 1 Encounters:  12/27/23 130/80   No changes needed Most recent labs reviewed  Disc lifstyle change with low sodium diet and exercise  Plan to continue Losartan  hct 100-25 mg daily Amlodipine  5 mg daily          Endocrine   Hyperlipidemia associated with type 2 diabetes mellitus (HCC)   Disc goals for lipids and reasons to control them Rev last labs with pt Rev low sat fat diet in detail  Needs exercise to increase HDL  LDL of 54 with atorvastatin  10       Relevant Medications   metFORMIN  (GLUCOPHAGE ) 500 MG tablet   Diabetes type 2, controlled (HCC) - Primary   Lab Results  Component Value Date   HGBA1C 6.5 (A) 12/27/2023   HGBA1C 6.5 (A) 07/21/2023   HGBA1C 6.9 (H) 04/16/2023   Improved with better diet /exercise/weight loss  Metformin  500 mg bid  Labs reviewed  Microalb utd  Taking a statin  Good foot care   Follow up in the fall for annual exam      Relevant Medications   metFORMIN  (GLUCOPHAGE ) 500 MG tablet   Other Relevant Orders   POCT HgB A1C (Completed)

## 2023-12-27 NOTE — Patient Instructions (Addendum)
 Keep working on diabetic diet  Try to get most of your carbohydrates from produce (with the exception of white potatoes) and whole grains Eat less bread/pasta/rice/snack foods/cereals/sweets and other items from the middle of the grocery store (processed carbs)   I sent metformin  500 to the pharmacy    I think you are due for the 2nd shingrix

## 2024-01-02 ENCOUNTER — Other Ambulatory Visit: Payer: Self-pay | Admitting: Urology

## 2024-01-02 DIAGNOSIS — R310 Gross hematuria: Secondary | ICD-10-CM

## 2024-01-02 DIAGNOSIS — Z87898 Personal history of other specified conditions: Secondary | ICD-10-CM

## 2024-01-02 DIAGNOSIS — N401 Enlarged prostate with lower urinary tract symptoms: Secondary | ICD-10-CM

## 2024-01-02 DIAGNOSIS — N528 Other male erectile dysfunction: Secondary | ICD-10-CM

## 2024-02-20 ENCOUNTER — Other Ambulatory Visit: Payer: Self-pay | Admitting: Family Medicine

## 2024-04-13 ENCOUNTER — Telehealth: Payer: Self-pay | Admitting: Family Medicine

## 2024-04-13 DIAGNOSIS — E119 Type 2 diabetes mellitus without complications: Secondary | ICD-10-CM

## 2024-04-13 DIAGNOSIS — E1169 Type 2 diabetes mellitus with other specified complication: Secondary | ICD-10-CM

## 2024-04-13 DIAGNOSIS — E786 Lipoprotein deficiency: Secondary | ICD-10-CM

## 2024-04-13 DIAGNOSIS — I10 Essential (primary) hypertension: Secondary | ICD-10-CM

## 2024-04-13 DIAGNOSIS — E669 Obesity, unspecified: Secondary | ICD-10-CM

## 2024-04-13 DIAGNOSIS — N401 Enlarged prostate with lower urinary tract symptoms: Secondary | ICD-10-CM

## 2024-04-13 NOTE — Telephone Encounter (Signed)
-----   Message from Veva JINNY Ferrari sent at 04/05/2024  3:21 PM EDT ----- Regarding: Lab orders for Mon, 10.6.25 Patient is scheduled for CPX labs, please order future labs, Thanks , Veva

## 2024-04-17 ENCOUNTER — Ambulatory Visit: Payer: Self-pay | Admitting: Family Medicine

## 2024-04-17 ENCOUNTER — Other Ambulatory Visit

## 2024-04-17 DIAGNOSIS — E119 Type 2 diabetes mellitus without complications: Secondary | ICD-10-CM | POA: Diagnosis not present

## 2024-04-17 DIAGNOSIS — I10 Essential (primary) hypertension: Secondary | ICD-10-CM | POA: Diagnosis not present

## 2024-04-17 DIAGNOSIS — E785 Hyperlipidemia, unspecified: Secondary | ICD-10-CM | POA: Diagnosis not present

## 2024-04-17 DIAGNOSIS — E1169 Type 2 diabetes mellitus with other specified complication: Secondary | ICD-10-CM | POA: Diagnosis not present

## 2024-04-17 LAB — CBC WITH DIFFERENTIAL/PLATELET
Basophils Absolute: 0 K/uL (ref 0.0–0.1)
Basophils Relative: 0.8 % (ref 0.0–3.0)
Eosinophils Absolute: 0 K/uL (ref 0.0–0.7)
Eosinophils Relative: 0.8 % (ref 0.0–5.0)
HCT: 41.2 % (ref 39.0–52.0)
Hemoglobin: 13.7 g/dL (ref 13.0–17.0)
Lymphocytes Relative: 35.6 % (ref 12.0–46.0)
Lymphs Abs: 1.8 K/uL (ref 0.7–4.0)
MCHC: 33.2 g/dL (ref 30.0–36.0)
MCV: 87.5 fl (ref 78.0–100.0)
Monocytes Absolute: 0.4 K/uL (ref 0.1–1.0)
Monocytes Relative: 8.5 % (ref 3.0–12.0)
Neutro Abs: 2.8 K/uL (ref 1.4–7.7)
Neutrophils Relative %: 54.3 % (ref 43.0–77.0)
Platelets: 175 K/uL (ref 150.0–400.0)
RBC: 4.71 Mil/uL (ref 4.22–5.81)
RDW: 13.3 % (ref 11.5–15.5)
WBC: 5.1 K/uL (ref 4.0–10.5)

## 2024-04-17 LAB — COMPREHENSIVE METABOLIC PANEL WITH GFR
ALT: 35 U/L (ref 0–53)
AST: 24 U/L (ref 0–37)
Albumin: 4.6 g/dL (ref 3.5–5.2)
Alkaline Phosphatase: 76 U/L (ref 39–117)
BUN: 15 mg/dL (ref 6–23)
CO2: 26 meq/L (ref 19–32)
Calcium: 9.6 mg/dL (ref 8.4–10.5)
Chloride: 103 meq/L (ref 96–112)
Creatinine, Ser: 0.8 mg/dL (ref 0.40–1.50)
GFR: 89.98 mL/min (ref >60.00)
Glucose, Bld: 152 mg/dL — ABNORMAL HIGH (ref 70–99)
Potassium: 3.9 meq/L (ref 3.5–5.1)
Sodium: 141 meq/L (ref 135–145)
Total Bilirubin: 1.6 mg/dL — ABNORMAL HIGH (ref 0.2–1.2)
Total Protein: 6.9 g/dL (ref 6.0–8.3)

## 2024-04-17 LAB — MICROALBUMIN / CREATININE URINE RATIO
Creatinine,U: 140.9 mg/dL
Microalb Creat Ratio: 239.7 mg/g — ABNORMAL HIGH (ref 0.0–30.0)
Microalb, Ur: 33.8 mg/dL — ABNORMAL HIGH (ref 0.0–1.9)

## 2024-04-17 LAB — LIPID PANEL
Cholesterol: 96 mg/dL (ref 0–200)
HDL: 34.3 mg/dL — ABNORMAL LOW (ref >39.00)
LDL Cholesterol: 39 mg/dL (ref 0–99)
NonHDL: 61.62
Total CHOL/HDL Ratio: 3
Triglycerides: 113 mg/dL (ref 0.0–149.0)
VLDL: 22.6 mg/dL (ref 0.0–40.0)

## 2024-04-17 LAB — HEMOGLOBIN A1C: Hgb A1c MFr Bld: 7 % — ABNORMAL HIGH (ref 4.6–6.5)

## 2024-04-17 LAB — TSH: TSH: 2.83 u[IU]/mL (ref 0.35–5.50)

## 2024-04-24 ENCOUNTER — Ambulatory Visit (INDEPENDENT_AMBULATORY_CARE_PROVIDER_SITE_OTHER): Admitting: Family Medicine

## 2024-04-24 ENCOUNTER — Encounter: Payer: Self-pay | Admitting: Family Medicine

## 2024-04-24 VITALS — BP 142/78 | HR 78 | Temp 98.1°F | Ht 68.0 in | Wt 220.4 lb

## 2024-04-24 DIAGNOSIS — Z0000 Encounter for general adult medical examination without abnormal findings: Secondary | ICD-10-CM

## 2024-04-24 DIAGNOSIS — E669 Obesity, unspecified: Secondary | ICD-10-CM

## 2024-04-24 DIAGNOSIS — E1129 Type 2 diabetes mellitus with other diabetic kidney complication: Secondary | ICD-10-CM

## 2024-04-24 DIAGNOSIS — I10 Essential (primary) hypertension: Secondary | ICD-10-CM

## 2024-04-24 DIAGNOSIS — E1169 Type 2 diabetes mellitus with other specified complication: Secondary | ICD-10-CM | POA: Diagnosis not present

## 2024-04-24 DIAGNOSIS — E785 Hyperlipidemia, unspecified: Secondary | ICD-10-CM

## 2024-04-24 DIAGNOSIS — R809 Proteinuria, unspecified: Secondary | ICD-10-CM | POA: Insufficient documentation

## 2024-04-24 DIAGNOSIS — Z1211 Encounter for screening for malignant neoplasm of colon: Secondary | ICD-10-CM

## 2024-04-24 DIAGNOSIS — N401 Enlarged prostate with lower urinary tract symptoms: Secondary | ICD-10-CM | POA: Diagnosis not present

## 2024-04-24 DIAGNOSIS — Z7984 Long term (current) use of oral hypoglycemic drugs: Secondary | ICD-10-CM | POA: Insufficient documentation

## 2024-04-24 DIAGNOSIS — R972 Elevated prostate specific antigen [PSA]: Secondary | ICD-10-CM

## 2024-04-24 NOTE — Assessment & Plan Note (Signed)
 Disc goals for lipids and reasons to control them Rev last labs with pt Rev low sat fat diet in detail  Needs exercise to increase HDL  LDL of 30 with atorvastatin  10 mg daily and improved diet

## 2024-04-24 NOTE — Assessment & Plan Note (Signed)
 Lab Results  Component Value Date   HGBA1C 7.0 (H) 04/17/2024   HGBA1C 6.5 (A) 12/27/2023   HGBA1C 6.5 (A) 07/21/2023   Elevated microalb ratio   Taking metformin  500 mg bid  Discussed option of glp-1 medicine  Given info on ozempic and mounjaro generics to read  Follow up 2-3 wk-will discuss further  Will bring some glucose readings

## 2024-04-24 NOTE — Assessment & Plan Note (Signed)
 Sees urology BPH Normal now  On avodart 

## 2024-04-24 NOTE — Assessment & Plan Note (Signed)
 Discussed how this problem influences overall health and the risks it imposes  Reviewed plan for weight loss with lower calorie diet (via better food choices (lower glycemic and portion control) along with exercise building up to or more than 30 minutes 5 days per week including some aerobic activity and strength training   Commended on good work so far   Discussed glp-1 med and info given  Follow up in 2-3 wk

## 2024-04-24 NOTE — Assessment & Plan Note (Signed)
Colonoscopy 05/2022  7 y recall

## 2024-04-24 NOTE — Assessment & Plan Note (Signed)
 Utd urol care Avodart  0.5 mg daily Last psa in normal range at 2.2

## 2024-04-24 NOTE — Assessment & Plan Note (Signed)
 Elevated ratio  A1c 7.0- discussed adding glp-1 to help this /DM and weight- follow up planned  On max dose of losartan   Blood pressure was elevated today / will follow up 2-3 wk
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
## 2024-04-24 NOTE — Patient Instructions (Addendum)
 Work up to exercise 5 days per week  Keep up the good work  Keep up the [employer]   I want to consider a GLP- 1 medicine for diabetes Here is info on generic ozempic and moujaro to read   Follow up in 2-3 weeks for blood pressure and diabetes  We will discuss further   Check some blood glucose levels Check some blood pressure (when relaxed) -not right after exercise

## 2024-04-24 NOTE — Assessment & Plan Note (Signed)
 Reviewed health habits including diet and exercise and skin cancer prevention Reviewed appropriate screening tests for age  Also reviewed health mt list, fam hx and immunization status , as well as social and family history   See HPI Labs reviewed and ordered Health Maintenance  Topic Date Due   Hepatitis C Screening  Never done   Eye exam for diabetics  03/16/2024   COVID-19 Vaccine (4 - 2025-26 season) 05/10/2025*   Hemoglobin A1C  10/16/2024   Complete foot exam   12/26/2024   Yearly kidney function blood test for diabetes  04/17/2025   Yearly kidney health urinalysis for diabetes  04/17/2025   DTaP/Tdap/Td vaccine (3 - Td or Tdap) 01/27/2027   Colon Cancer Screening  05/28/2029   Pneumococcal Vaccine for age over 53  Completed   Flu Shot  Completed   Zoster (Shingles) Vaccine  Completed   Meningitis B Vaccine  Aged Out  *Topic was postponed. The date shown is not the original due date.    Utd urology care and psa  Colonoscopy 05/2022 -polyp No falls /fractures  Discussed fall prevention, supplements and exercise for bone density  Encouraged to continue vitamin D PHQ 0
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
## 2024-05-12 ENCOUNTER — Ambulatory Visit: Admitting: Family Medicine

## 2024-05-12 ENCOUNTER — Encounter: Payer: Self-pay | Admitting: Family Medicine

## 2024-05-12 VITALS — BP 126/71 | HR 65 | Temp 98.6°F | Ht 68.0 in | Wt 222.4 lb

## 2024-05-12 DIAGNOSIS — R809 Proteinuria, unspecified: Secondary | ICD-10-CM

## 2024-05-12 DIAGNOSIS — E1169 Type 2 diabetes mellitus with other specified complication: Secondary | ICD-10-CM

## 2024-05-12 DIAGNOSIS — E785 Hyperlipidemia, unspecified: Secondary | ICD-10-CM

## 2024-05-12 DIAGNOSIS — E1129 Type 2 diabetes mellitus with other diabetic kidney complication: Secondary | ICD-10-CM

## 2024-05-12 DIAGNOSIS — I10 Essential (primary) hypertension: Secondary | ICD-10-CM

## 2024-05-12 MED ORDER — AMLODIPINE BESYLATE 5 MG PO TABS: 5.0000 mg | ORAL_TABLET | Freq: Every day | ORAL | 3 refills | Status: AC

## 2024-05-12 NOTE — Patient Instructions (Addendum)
 Aim for at least 30 minutes of exercise daily (or more)  5 or more days per week  Add weights/strength training   Blood pressure is better   Avoid added sugars in your diet when you can  Try to get most of your carbohydrates from produce (with the exception of white potatoes) and whole grains Eat less bread/pasta/rice/snack foods/cereals/sweets and other items from the middle of the grocery store (processed carbs)  Follow up in 3 months

## 2024-05-12 NOTE — Assessment & Plan Note (Signed)
 Lab Results  Component Value Date   HGBA1C 7.0 (H) 04/17/2024   HGBA1C 6.5 (A) 12/27/2023   HGBA1C 6.5 (A) 07/21/2023   Pt would like to work on lifestyle change before adding or increasing glycemic medication  Leaned about glp-1 (would benefit microalbuminuria and weight) Or increase metformin   Detailed discussion re: lifestyle changes

## 2024-05-12 NOTE — Assessment & Plan Note (Signed)
 Improved  bp in fair control at this time  BP Readings from Last 1 Encounters:  05/12/24 126/71   No changes needed Most recent labs reviewed  Disc lifstyle change with low sodium diet and exercise  Losartan  hct 100-25 mg daily  Amlodipine  5 mg daily

## 2024-05-12 NOTE — Progress Notes (Signed)
 Subjective:    Patient ID: Daniel Lee, male    DOB: 1956-01-01, 68 y.o.   MRN: [id number]  HPI  Wt Readings from Last 3 Encounters:  05/12/24 222 lb 6 oz (100.9 kg)  04/24/24 220 lb 6 oz (100 kg)  12/27/23 223 lb (101.2 kg)   33.81 kg/m  Vitals:   05/12/24 0757 05/12/24 0814  BP: (!) 154/86 126/71  Pulse: 65   Temp: 98.6 F (37 C)   SpO2: 99%     Pt presents for follow up of chronic medical problems  HTN DM2  HTN bp is stable today  No cp or palpitations or headaches or edema  No side effects to medicines  BP Readings from Last 3 Encounters:  05/12/24 126/71  04/24/24 (!) 142/78  12/27/23 130/80     Lab Results  Component Value Date   NA 141 04/17/2024   K 3.9 04/17/2024   CO2 26 04/17/2024   GLUCOSE 152 (H) 04/17/2024   BUN 15 04/17/2024   CREATININE 0.80 04/17/2024   CALCIUM  9.6 04/17/2024   GFR 89.98 04/17/2024   GFRNONAA >60 12/29/2017   Last visit blood pressure was up   At home mid 120s/high 70s  Last 125/79   Losartan  hct 100-25 mg daily  Amlodipine  5 mg daily   DM2 Diabetes Home sugar results -pretty good/ usually 120s average  One reading in 200s    DM diet -fair  Thinks he could do better in general     Exercise  If he walks after eating -it makes a difference  More walking  Also walks with weights Lifts weights also    Metformin  500 mg bid   Lab Results  Component Value Date   HGBA1C 7.0 (H) 04/17/2024   HGBA1C 6.5 (A) 12/27/2023   HGBA1C 6.5 (A) 07/21/2023   Lab Results  Component Value Date   LABMICR See below: 10/06/2023   LABMICR See below: 10/18/2020   MICROALBUR 33.8 (H) 04/17/2024   MICROALBUR 1.0 06/23/2010   Elevated microalb ratio  Last visit discussed opt of glp-1 Wants to wait 3 months   Renal protection- arb  Last eye exam     Patient Active Problem List   Diagnosis Date Noted   Microalbuminuria 04/24/2024   Long term current use of oral hypoglycemic drug 04/24/2024   Adenomatous  polyp of colon 05/28/2022   Screening for colon cancer 03/23/2022   Ganglion cyst of right foot 05/24/2020   Obesity (BMI 30-39.9) 08/28/2013   Routine general medical examination at a health care facility 06/22/2011   Essential hypertension, benign 04/25/2009   Hyperlipidemia associated with type 2 diabetes mellitus (HCC) 07/19/2008   Diabetes mellitus with microalbuminuria, without long-term current use of insulin  (HCC) 04/16/2008   Low HDL (under 40) 03/18/2007   ERECTILE DYSFUNCTION 10/07/2006   BPH (benign prostatic hyperplasia) 10/07/2006   Past Medical History:  Diagnosis Date   BPH (benign prostatic hypertrophy)    Diabetes mellitus without complication (HCC)    Diabetes type 2, controlled (HCC) 04/16/2008   Qualifier: Diagnosis of  By: Randeen MD, Laine Caldron    ED (erectile dysfunction)    Elevated alanine aminotransferase (ALT) level    suspect fatty liver   Hyperglycemia    Hypertension    Past Surgical History:  Procedure Laterality Date   abd ultrasound  11/2005   negative   COLONOSCOPY     COLONOSCOPY WITH PROPOFOL  N/A 05/28/2022   Procedure: COLONOSCOPY WITH PROPOFOL ;  Surgeon: Therisa Bi,  MD;  Location: [facility];  Service: Gastroenterology;  Laterality: N/A;   HOLEP-LASER ENUCLEATION OF THE PROSTATE WITH MORCELLATION N/A 12/28/2017   Procedure: HOLEP-LASER ENUCLEATION OF THE PROSTATE WITH MORCELLATION;  Surgeon: Penne Knee, MD;  Location: [facility];  Service: Urology;  Laterality: N/A;   PROSTATE SURGERY  2003   prostate biopsy negative // urologist   Social History   Tobacco Use   Smoking status: Former    Current packs/day: 0.00    Types: Cigarettes    Quit date: 07/13/2001    Years since quitting: 22.8   Smokeless tobacco: Former    Types: Chew    Quit date: 07/13/2001   Tobacco comments:    smoked a cigarette every couple of days  Vaping Use   Vaping status: Never Used  Substance Use Topics   Alcohol use: No    Alcohol/week: 0.0 standard  drinks of alcohol   Drug use: No   Family History  Problem Relation Age of Onset   Cancer Father        prostate cancer    Diabetes Father    Stroke Father    Prostate cancer Father    Hypertension Mother    Hyperlipidemia Mother    Bladder Cancer Mother    Heart disease Brother    Diabetes Brother    Kidney disease Brother    Cancer Paternal Uncle        prostate cancer   Kidney cancer Neg Hx    No Known Allergies Current Outpatient Medications on File Prior to Visit  Medication Sig Dispense Refill   ACCU-CHEK AVIVA PLUS test strip USE TO CHECK BLOOD SUGAR ONCE DAILY (DX. E11.9) 100 strip 1   atorvastatin  (LIPITOR) 10 MG tablet Take 1 tablet (10 mg total) by mouth daily. 90 tablet 3   dutasteride  (AVODART ) 0.5 MG capsule TAKE 1 CAPSULE BY MOUTH EVERY DAY 90 capsule 3   Lancets (ACCU-CHEK MULTICLIX) lancets Use as instructed 100 each 12   losartan -hydrochlorothiazide  (HYZAAR) 100-25 MG tablet TAKE 1 TABLET BY MOUTH EVERY DAY 90 tablet 3   metFORMIN  (GLUCOPHAGE ) 500 MG tablet Take 1 tablet (500 mg total) by mouth 2 (two) times daily with a meal. 180 tablet 3   sildenafil  (REVATIO ) 20 MG tablet Take 1 tablet (20 mg total) by mouth as needed. Take 1-5 tabs as needed prior to intercourse 30 tablet 12   No current facility-administered medications on file prior to visit.    Review of Systems  Constitutional:  Negative for activity change, appetite change, fatigue, fever and unexpected weight change.  HENT:  Negative for congestion, rhinorrhea, sore throat and trouble swallowing.   Eyes:  Negative for pain, redness, itching and visual disturbance.  Respiratory:  Negative for cough, chest tightness, shortness of breath and wheezing.   Cardiovascular:  Negative for chest pain and palpitations.  Gastrointestinal:  Negative for abdominal pain, blood in stool, constipation, diarrhea and nausea.  Endocrine: Negative for cold intolerance, heat intolerance, polydipsia and polyuria.   Genitourinary:  Negative for difficulty urinating, dysuria, frequency and urgency.  Musculoskeletal:  Negative for arthralgias, joint swelling and myalgias.  Skin:  Negative for pallor and rash.  Neurological:  Negative for dizziness, tremors, weakness, numbness and headaches.  Hematological:  Negative for adenopathy. Does not bruise/bleed easily.  Psychiatric/Behavioral:  Negative for decreased concentration and dysphoric mood. The patient is not nervous/anxious.        Objective:   Physical Exam Constitutional:      General: He is  not in acute distress.    Appearance: Normal appearance. He is well-developed. He is obese. He is not ill-appearing or diaphoretic.  HENT:     Head: Normocephalic and atraumatic.  Eyes:     Conjunctiva/sclera: Conjunctivae normal.     Pupils: Pupils are equal, round, and reactive to light.  Neck:     Thyroid : No thyromegaly.     Vascular: No carotid bruit or JVD.  Cardiovascular:     Rate and Rhythm: Normal rate and regular rhythm.     Heart sounds: Normal heart sounds.     No gallop.  Pulmonary:     Effort: Pulmonary effort is normal. No respiratory distress.     Breath sounds: Normal breath sounds. No wheezing or rales.  Abdominal:     General: There is no distension or abdominal bruit.     Palpations: Abdomen is soft.  Musculoskeletal:     Cervical back: Normal range of motion and neck supple.     Right lower leg: No edema.     Left lower leg: No edema.  Lymphadenopathy:     Cervical: No cervical adenopathy.  Skin:    General: Skin is warm and dry.     Coloration: Skin is not pale.     Findings: No rash.  Neurological:     Mental Status: He is alert.     Coordination: Coordination normal.     Deep Tendon Reflexes: Reflexes are normal and symmetric. Reflexes normal.  Psychiatric:        Mood and Affect: Mood normal.           Assessment & Plan:   Problem List Items Addressed This Visit       Cardiovascular and Mediastinum    Essential hypertension, benign   Improved  bp in fair control at this time  BP Readings from Last 1 Encounters:  05/12/24 126/71   No changes needed Most recent labs reviewed  Disc lifstyle change with low sodium diet and exercise  Losartan  hct 100-25 mg daily  Amlodipine  5 mg daily          Relevant Medications   amLODipine  (NORVASC ) 5 MG tablet     Endocrine   Hyperlipidemia associated with type 2 diabetes mellitus (HCC)   Relevant Medications   amLODipine  (NORVASC ) 5 MG tablet   Diabetes mellitus with microalbuminuria, without long-term current use of insulin  (HCC) - Primary   Lab Results  Component Value Date   HGBA1C 7.0 (H) 04/17/2024   HGBA1C 6.5 (A) 12/27/2023   HGBA1C 6.5 (A) 07/21/2023   Pt would like to work on lifestyle change before adding or increasing glycemic medication  Leaned about glp-1 (would benefit microalbuminuria and weight) Or increase metformin   Detailed discussion re: lifestyle changes         Other   Microalbuminuria   Discussed opt of glp-1  Will discuss next visit  Aiming for lifestyle change/better control of glucose

## 2024-05-12 NOTE — Assessment & Plan Note (Signed)
 Discussed opt of glp-1  Will discuss next visit  Aiming for lifestyle change/better control of glucose

## 2024-05-14 ENCOUNTER — Other Ambulatory Visit: Payer: Self-pay | Admitting: Family Medicine

## 2024-05-21 ENCOUNTER — Other Ambulatory Visit: Payer: Self-pay | Admitting: Family Medicine

## 2024-05-21 DIAGNOSIS — E1169 Type 2 diabetes mellitus with other specified complication: Secondary | ICD-10-CM

## 2024-06-18 ENCOUNTER — Other Ambulatory Visit: Payer: Self-pay | Admitting: Family Medicine

## 2024-06-18 DIAGNOSIS — I10 Essential (primary) hypertension: Secondary | ICD-10-CM

## 2024-08-11 ENCOUNTER — Ambulatory Visit: Admitting: Family Medicine

## 2024-08-11 ENCOUNTER — Encounter: Payer: Self-pay | Admitting: Family Medicine

## 2024-08-11 VITALS — BP 131/70 | HR 68 | Temp 98.0°F | Ht 68.0 in | Wt 229.5 lb

## 2024-08-11 DIAGNOSIS — E1169 Type 2 diabetes mellitus with other specified complication: Secondary | ICD-10-CM | POA: Diagnosis not present

## 2024-08-11 DIAGNOSIS — E1129 Type 2 diabetes mellitus with other diabetic kidney complication: Secondary | ICD-10-CM | POA: Diagnosis not present

## 2024-08-11 DIAGNOSIS — Z6834 Body mass index (BMI) 34.0-34.9, adult: Secondary | ICD-10-CM

## 2024-08-11 DIAGNOSIS — E785 Hyperlipidemia, unspecified: Secondary | ICD-10-CM | POA: Diagnosis not present

## 2024-08-11 DIAGNOSIS — R809 Proteinuria, unspecified: Secondary | ICD-10-CM

## 2024-08-11 DIAGNOSIS — I10 Essential (primary) hypertension: Secondary | ICD-10-CM

## 2024-08-11 DIAGNOSIS — Z7984 Long term (current) use of oral hypoglycemic drugs: Secondary | ICD-10-CM | POA: Diagnosis not present

## 2024-08-11 DIAGNOSIS — E669 Obesity, unspecified: Secondary | ICD-10-CM | POA: Diagnosis not present

## 2024-08-11 LAB — POCT GLYCOSYLATED HEMOGLOBIN (HGB A1C): Hemoglobin A1C: 7.2 % — AB (ref 4.0–5.6)

## 2024-08-11 MED ORDER — TIRZEPATIDE 2.5 MG/0.5ML ~~LOC~~ SOAJ: 2.5000 mg | SUBCUTANEOUS | 0 refills | Status: AC

## 2024-08-11 NOTE — Assessment & Plan Note (Signed)
 Disc goals for lipids and reasons to control them Rev last labs with pt Rev low sat fat diet in detail  Needs exercise to increase HDL  LDL of 30 with atorvastatin  10 mg daily and improved diet

## 2024-08-11 NOTE — Assessment & Plan Note (Signed)
" °  Lab Results  Component Value Date   HGBA1C 7.2 (A) 08/11/2024   HGBA1C 7.0 (H) 04/17/2024   HGBA1C 6.5 (A) 12/27/2023   Weight is up -stopped exercising  Diet is fair  Hard time losing weight  Sent for eye exam report  Metformin  500 mg bid   Discussed glp-1 options for this and weight  Sent tirzepatide  2.5 mg to pharmacy =he will let us  know if /when he gets it and make a follow up plan   Disc option of GLP medication including possible side effects like GI intolerance and risk of thyroid  and endocrine cancer, pancreatitis and gallstones, kidney problems and diabetic retinopathy    Orders:   POCT HgB A1C  "

## 2024-08-11 NOTE — Assessment & Plan Note (Signed)
 Improved  bp in fair control at this time  BP Readings from Last 1 Encounters:  08/11/24 131/70   No changes needed Most recent labs reviewed  Disc lifstyle change with low sodium diet and exercise  Losartan  hct 100-25 mg daily  Amlodipine  5 mg daily   Plan to work on weight loss May start glp-1 if able to get it

## 2024-08-11 NOTE — Assessment & Plan Note (Signed)
 Lab Results  Component Value Date   LABMICR See below: 10/06/2023   LABMICR See below: 10/18/2020   MICROALBUR 33.8 (H) 04/17/2024   MICROALBUR 1.0 06/23/2010      Planning to start glp-1 if covered Is on arb

## 2024-08-11 NOTE — Progress Notes (Unsigned)
 "  Subjective:    Patient ID: Daniel Lee, male    DOB: Dec 16, 1955, 69 y.o.   MRN: [id number]  HPI  Wt Readings from Last 3 Encounters:  08/11/24 229 lb 8 oz (104.1 kg)  05/12/24 222 lb 6 oz (100.9 kg)  04/24/24 220 lb 6 oz (100 kg)   34.90 kg/m  Vitals:   08/11/24 0811 08/11/24 0839  BP: (!) 154/86 131/70  Pulse: 68   Temp: 98 F (36.7 C)   SpO2: 97%    Pt presents for follow up of chronic medical problems DM2 HTN Hyperlipidemia   Feeling good     HTN bp is stable today  No cp or palpitations or headaches or edema  No side effects to medicines  BP Readings from Last 3 Encounters:  08/11/24 131/70  05/12/24 126/71  04/24/24 (!) 142/78     Lab Results  Component Value Date   NA 141 04/17/2024   K 3.9 04/17/2024   CO2 26 04/17/2024   GLUCOSE 152 (H) 04/17/2024   BUN 15 04/17/2024   CREATININE 0.80 04/17/2024   CALCIUM  9.6 04/17/2024   GFR 89.98 04/17/2024   GFRNONAA >60 12/29/2017   Losartan  hct 100-25 mg daily  Amlodipine  5 mg daily   Blood pressure is ok at home   DM2 with microalbuminuria  A1c 7.2 today  Lab Results  Component Value Date   HGBA1C 7.2 (A) 08/11/2024   HGBA1C 7.0 (H) 04/17/2024   HGBA1C 6.5 (A) 12/27/2023   Lab Results  Component Value Date   LABMICR See below: 10/06/2023   LABMICR See below: 10/18/2020   MICROALBUR 33.8 (H) 04/17/2024   MICROALBUR 1.0 06/23/2010   Metformin  500 mg bid  Discussed glp-1 at last visit   Has not had time to exercise  Was doing better before the holiday   Weight is up 7 lb   Eating - smaller portions  Is avoiding added sugars  Did eat a cookie yesterday-unusual   Eye exam   Hyperlipidemia Lab Results  Component Value Date   CHOL 96 04/17/2024   HDL 34.30 (L) 04/17/2024   LDLCALC 39 04/17/2024   LDLDIRECT 99.9 06/23/2011   TRIG 113.0 04/17/2024   CHOLHDL 3 04/17/2024   Atorvastatin  10 mg daily      Patient Active Problem List   Diagnosis Date Noted    Microalbuminuria 04/24/2024   Long term current use of oral hypoglycemic drug 04/24/2024   Adenomatous polyp of colon 05/28/2022   Screening for colon cancer 03/23/2022   Ganglion cyst of right foot 05/24/2020   Obesity (BMI 30-39.9) 08/28/2013   Routine general medical examination at a health care facility 06/22/2011   Essential hypertension, benign 04/25/2009   Hyperlipidemia associated with type 2 diabetes mellitus (HCC) 07/19/2008   Diabetes mellitus with microalbuminuria, without long-term current use of insulin  (HCC) 04/16/2008   Low HDL (under 40) 03/18/2007   ERECTILE DYSFUNCTION 10/07/2006   BPH (benign prostatic hyperplasia) 10/07/2006   Past Medical History:  Diagnosis Date   BPH (benign prostatic hypertrophy)    Diabetes mellitus without complication (HCC)    Diabetes type 2, controlled (HCC) 04/16/2008   Qualifier: Diagnosis of  By: Randeen MD, Laine Caldron    ED (erectile dysfunction)    Elevated alanine aminotransferase (ALT) level    suspect fatty liver   Hyperglycemia    Hypertension    Past Surgical History:  Procedure Laterality Date   abd ultrasound  11/2005   negative   COLONOSCOPY  COLONOSCOPY WITH PROPOFOL  N/A 05/28/2022   Procedure: COLONOSCOPY WITH PROPOFOL ;  Surgeon: Therisa Bi, MD;  Location: [facility];  Service: Gastroenterology;  Laterality: N/A;   HOLEP-LASER ENUCLEATION OF THE PROSTATE WITH MORCELLATION N/A 12/28/2017   Procedure: HOLEP-LASER ENUCLEATION OF THE PROSTATE WITH MORCELLATION;  Surgeon: Penne Knee, MD;  Location: [facility] OR[facility];  Service: Urology;  Laterality: N/A;   PROSTATE SURGERY  2003   prostate biopsy negative // urologist   Social History[1] Family History  Problem Relation Age of Onset   Cancer Father        prostate cancer    Diabetes Father    Stroke Father    Prostate cancer Father    Hypertension Mother    Hyperlipidemia Mother    Bladder Cancer Mother    Heart disease Brother    Diabetes Brother    Kidney  disease Brother    Cancer Paternal Uncle        prostate cancer   Kidney cancer Neg Hx    Allergies[2] Medications Ordered Prior to Encounter[3]  Review of Systems     Objective:   Physical Exam        Assessment & Plan:   Assessment & Plan Diabetes mellitus with microalbuminuria, without long-term current use of insulin  (HCC)  Lab Results  Component Value Date   HGBA1C 7.2 (A) 08/11/2024   HGBA1C 7.0 (H) 04/17/2024   HGBA1C 6.5 (A) 12/27/2023   Weight is up -stopped exercising  Diet is fair  Hard time losing weight  Sent for eye exam report  Metformin  500 mg bid   Discussed glp-1 options for this and weight  Sent tirzepatide  2.5 mg to pharmacy =he will let us  know if /when he gets it and make a follow up plan   Disc option of GLP medication including possible side effects like GI intolerance and risk of thyroid  and endocrine cancer, pancreatitis and gallstones, kidney problems and diabetic retinopathy    Orders:   POCT HgB A1C  Hyperlipidemia associated with type 2 diabetes mellitus (HCC) Disc goals for lipids and reasons to control them Rev last labs with pt Rev low sat fat diet in detail  Needs exercise to increase HDL  LDL of 30 with atorvastatin  10 mg daily and improved diet      Essential hypertension, benign Improved  bp in fair control at this time  BP Readings from Last 1 Encounters:  08/11/24 131/70   No changes needed Most recent labs reviewed  Disc lifstyle change with low sodium diet and exercise  Losartan  hct 100-25 mg daily  Amlodipine  5 mg daily   Plan to work on weight loss May start glp-1 if able to get it        Obesity (BMI 30-39.9) Discussed how this problem influences overall health and the risks it imposes  Reviewed plan for weight loss with lower calorie diet (via better food choices (lower glycemic and portion control) along with exercise building up to or more than 30 minutes 5 days per week including some aerobic  activity and strength training   Encouraged strongly to start strength training again   In process of trying to get tirzepatide  for dm2     Microalbuminuria Lab Results  Component Value Date   LABMICR See below: 10/06/2023   LABMICR See below: 10/18/2020   MICROALBUR 33.8 (H) 04/17/2024   MICROALBUR 1.0 06/23/2010      Planning to start glp-1 if covered Is on arb           [  1]  Social History Tobacco Use   Smoking status: Former    Current packs/day: 0.00    Average packs/day: 0.3 packs/day    Types: Cigarettes    Quit date: 07/13/2001    Years since quitting: 23.0   Smokeless tobacco: Former    Types: Chew    Quit date: 07/13/2001   Tobacco comments:    smoked a cigarette every couple of days  Vaping Use   Vaping status: Never Used  Substance Use Topics   Alcohol use: No    Alcohol/week: 0.0 standard drinks of alcohol   Drug use: No  [2] No Known Allergies [3]  Current Outpatient Medications on File Prior to Visit  Medication Sig Dispense Refill   ACCU-CHEK AVIVA PLUS test strip USE TO CHECK BLOOD SUGAR ONCE DAILY (DX. E11.9) 100 strip 1   amLODipine  (NORVASC ) 5 MG tablet Take 1 tablet (5 mg total) by mouth daily. 90 tablet 3   atorvastatin  (LIPITOR) 10 MG tablet TAKE 1 TABLET BY MOUTH EVERY DAY 90 tablet 2   dutasteride  (AVODART ) 0.5 MG capsule TAKE 1 CAPSULE BY MOUTH EVERY DAY 90 capsule 3   Lancets (ACCU-CHEK MULTICLIX) lancets Use as instructed 100 each 12   losartan -hydrochlorothiazide  (HYZAAR) 100-25 MG tablet TAKE 1 TABLET BY MOUTH EVERY DAY 90 tablet 0   metFORMIN  (GLUCOPHAGE ) 500 MG tablet Take 1 tablet (500 mg total) by mouth 2 (two) times daily with a meal. 180 tablet 3   sildenafil  (REVATIO ) 20 MG tablet Take 1 tablet (20 mg total) by mouth as needed. Take 1-5 tabs as needed prior to intercourse 30 tablet 12   No current facility-administered medications on file prior to visit.   "

## 2024-08-11 NOTE — Patient Instructions (Addendum)
 Aim for exercise 30 or more minutes at least 5 d per week   Add some strength training to your routine, this is important for bone and brain health and can reduce your risk of falls and help your body use insulin  properly and regulate weight  Light weights, exercise bands , and internet videos are a good way to start  Yoga (chair or regular), machines , floor exercises or a gym with machines are also good options   Avoid added sugars in your diet when you can  Try to get most of your carbohydrates from produce (with the exception of white potatoes) and whole grains Eat less bread/pasta/rice/snack foods/cereals/sweets and other items from the middle of the grocery store (processed carbs)  Continue metformin    I sent generic moujaro to your pharmacy  Let's see if it is covered  If it is -then call us  once you get before you start   We will plan follow up then

## 2024-08-13 ENCOUNTER — Other Ambulatory Visit: Payer: Self-pay | Admitting: Family Medicine

## 2024-08-13 ENCOUNTER — Encounter: Payer: Self-pay | Admitting: Family Medicine

## 2024-08-16 ENCOUNTER — Ambulatory Visit: Admitting: Family Medicine

## 2024-08-16 ENCOUNTER — Encounter: Payer: Self-pay | Admitting: Family Medicine

## 2024-08-16 VITALS — BP 130/79 | HR 71 | Temp 98.2°F | Ht 68.0 in | Wt 224.2 lb

## 2024-08-16 DIAGNOSIS — J069 Acute upper respiratory infection, unspecified: Secondary | ICD-10-CM | POA: Insufficient documentation

## 2024-08-16 NOTE — Assessment & Plan Note (Signed)
 Worse on Monday/Tuesday and much improved today  Felt feverish (no temp)-better now as well  Still some nasal symptoms  Reassuring exam Normal temp today  Disc symptomatic care - see instructions on AVS  Steroid ns/ saline ns may help in dry months also Update if not starting to improve in a week or if worsening  Call back and Er precautions noted in detail today

## 2024-08-16 NOTE — Progress Notes (Signed)
 "  Subjective:    Patient ID: Daniel Lee, male    DOB: 1955/10/09, 69 y.o.   MRN: [id number]  HPI  Wt Readings from Last 3 Encounters:  08/16/24 224 lb 4 oz (101.7 kg)  08/11/24 229 lb 8 oz (104.1 kg)  05/12/24 222 lb 6 oz (100.9 kg)   34.10 kg/m  Vitals:   08/16/24 1446 08/16/24 1504  BP: (!) 142/78 130/79  Pulse: 71   Temp: 98.2 F (36.8 C)   SpO2: 97%    Pt presents for c/o Sinus symptoms / uri   Was out shoveling this weekend   Monday- congestion  Yesterday- low grade temp he thinks , felt cold but no aches and and just felt bad  Left side of face was painful   Runny nose  All clear so far  No ST  Ears are ok  No cough     Tried to keep up with fluids (does not think he got dehydrated shoveling)  Urine is not dark   Over the counter  Zyrtec Dimetapp  Nothing today -feels ok    Patient Active Problem List   Diagnosis Date Noted   Viral URI 08/16/2024   Microalbuminuria 04/24/2024   Long term current use of oral hypoglycemic drug 04/24/2024   Adenomatous polyp of colon 05/28/2022   Screening for colon cancer 03/23/2022   Ganglion cyst of right foot 05/24/2020   Obesity (BMI 30-39.9) 08/28/2013   Routine general medical examination at a health care facility 06/22/2011   Essential hypertension, benign 04/25/2009   Hyperlipidemia associated with type 2 diabetes mellitus (HCC) 07/19/2008   Diabetes mellitus with microalbuminuria, without long-term current use of insulin  (HCC) 04/16/2008   Low HDL (under 40) 03/18/2007   ERECTILE DYSFUNCTION 10/07/2006   BPH (benign prostatic hyperplasia) 10/07/2006   Past Medical History:  Diagnosis Date   BPH (benign prostatic hypertrophy)    Diabetes mellitus without complication (HCC)    Diabetes type 2, controlled (HCC) 04/16/2008   Qualifier: Diagnosis of  By: Randeen MD, Laine Caldron    ED (erectile dysfunction)    Elevated alanine aminotransferase (ALT) level    suspect fatty liver   Hyperglycemia     Hypertension    Past Surgical History:  Procedure Laterality Date   abd ultrasound  11/2005   negative   COLONOSCOPY     COLONOSCOPY WITH PROPOFOL  N/A 05/28/2022   Procedure: COLONOSCOPY WITH PROPOFOL ;  Surgeon: Therisa Bi, MD;  Location: [facility] ENDOSCOPY;  Service: Gastroenterology;  Laterality: N/A;   HOLEP-LASER ENUCLEATION OF THE PROSTATE WITH MORCELLATION N/A 12/28/2017   Procedure: HOLEP-LASER ENUCLEATION OF THE PROSTATE WITH MORCELLATION;  Surgeon: Penne Knee, MD;  Location: [facility] ORS;  Service: Urology;  Laterality: N/A;   PROSTATE SURGERY  2003   prostate biopsy negative // urologist   Social History[1] Family History  Problem Relation Age of Onset   Cancer Father        prostate cancer    Diabetes Father    Stroke Father    Prostate cancer Father    Hypertension Mother    Hyperlipidemia Mother    Bladder Cancer Mother    Heart disease Brother    Diabetes Brother    Kidney disease Brother    Cancer Paternal Uncle        prostate cancer   Kidney cancer Neg Hx    Allergies[2] Medications Ordered Prior to Encounter[3]  Review of Systems  Constitutional:  Positive for fatigue. Negative for appetite change and  fever.  HENT:  Positive for congestion, postnasal drip and rhinorrhea. Negative for ear pain, sinus pressure, sinus pain, sneezing and sore throat.        Had some facial pain yesterday Better today  Eyes:  Negative for pain and discharge.  Respiratory:  Negative for cough, shortness of breath, wheezing and stridor.   Cardiovascular:  Negative for chest pain.  Gastrointestinal:  Negative for diarrhea, nausea and vomiting.  Genitourinary:  Negative for frequency, hematuria and urgency.  Musculoskeletal:  Negative for arthralgias and myalgias.  Skin:  Negative for rash.  Neurological:  Negative for dizziness, weakness, light-headedness and headaches.  Psychiatric/Behavioral:  Negative for confusion and dysphoric mood.        Objective:   Physical  Exam Constitutional:      General: He is not in acute distress.    Appearance: Normal appearance. He is well-developed. He is obese. He is not ill-appearing or diaphoretic.  HENT:     Head: Normocephalic and atraumatic.     Comments: No facial swelling or tenderness     Right Ear: Tympanic membrane and ear canal normal.     Left Ear: Tympanic membrane and ear canal normal.     Nose: Congestion present.     Comments: Mild congestion  Boggy nares    Mouth/Throat:     Mouth: Mucous membranes are moist.     Pharynx: Oropharynx is clear. No oropharyngeal exudate or posterior oropharyngeal erythema.  Eyes:     General:        Right eye: No discharge.        Left eye: No discharge.     Conjunctiva/sclera: Conjunctivae normal.     Pupils: Pupils are equal, round, and reactive to light.  Neck:     Thyroid : No thyromegaly.     Vascular: No carotid bruit or JVD.  Cardiovascular:     Rate and Rhythm: Normal rate and regular rhythm.     Heart sounds: Normal heart sounds.     No gallop.  Pulmonary:     Effort: Pulmonary effort is normal. No respiratory distress.     Breath sounds: Normal breath sounds. No stridor. No wheezing, rhonchi or rales.  Abdominal:     General: There is no distension or abdominal bruit.     Palpations: Abdomen is soft.  Musculoskeletal:     Cervical back: Normal range of motion and neck supple.     Right lower leg: No edema.     Left lower leg: No edema.  Lymphadenopathy:     Cervical: No cervical adenopathy.  Skin:    General: Skin is warm and dry.     Coloration: Skin is not pale.     Findings: No rash.  Neurological:     Mental Status: He is alert.     Coordination: Coordination normal.     Deep Tendon Reflexes: Reflexes are normal and symmetric. Reflexes normal.  Psychiatric:        Mood and Affect: Mood normal.           Assessment & Plan:   Problem List Items Addressed This Visit       Respiratory   Viral URI - Primary   Worse on  Monday/Tuesday and much improved today  Felt feverish (no temp)-better now as well  Still some nasal symptoms  Reassuring exam Normal temp today  Disc symptomatic care - see instructions on AVS  Steroid ns/ saline ns may help in dry months also Update if not  starting to improve in a week or if worsening  Call back and Er precautions noted in detail today           [1]  Social History Tobacco Use   Smoking status: Former    Current packs/day: 0.00    Average packs/day: 0.3 packs/day    Types: Cigarettes    Quit date: 07/13/2001    Years since quitting: 23.1   Smokeless tobacco: Former    Types: Chew    Quit date: 07/13/2001   Tobacco comments:    smoked a cigarette every couple of days  Vaping Use   Vaping status: Never Used  Substance Use Topics   Alcohol use: No    Alcohol/week: 0.0 standard drinks of alcohol   Drug use: No  [2] No Known Allergies [3]  Current Outpatient Medications on File Prior to Visit  Medication Sig Dispense Refill   ACCU-CHEK AVIVA PLUS test strip USE TO CHECK BLOOD SUGAR ONCE DAILY (DX. E11.9) 100 strip 1   amLODipine  (NORVASC ) 5 MG tablet Take 1 tablet (5 mg total) by mouth daily. 90 tablet 3   atorvastatin  (LIPITOR) 10 MG tablet TAKE 1 TABLET BY MOUTH EVERY DAY 90 tablet 2   dutasteride  (AVODART ) 0.5 MG capsule TAKE 1 CAPSULE BY MOUTH EVERY DAY 90 capsule 3   Lancets (ACCU-CHEK MULTICLIX) lancets Use as instructed 100 each 12   losartan -hydrochlorothiazide  (HYZAAR) 100-25 MG tablet TAKE 1 TABLET BY MOUTH EVERY DAY 90 tablet 0   metFORMIN  (GLUCOPHAGE ) 500 MG tablet Take 1 tablet (500 mg total) by mouth 2 (two) times daily with a meal. 180 tablet 3   sildenafil  (REVATIO ) 20 MG tablet Take 1 tablet (20 mg total) by mouth as needed. Take 1-5 tabs as needed prior to intercourse 30 tablet 12   tirzepatide  (MOUNJARO ) 2.5 MG/0.5ML Pen Inject 2.5 mg into the skin once a week. 2 mL 0   No current facility-administered medications on file prior to visit.    "

## 2024-08-16 NOTE — Patient Instructions (Addendum)
 Drink fluids and rest    For congestion  Flonase or nasacort nasal spray over the counter is helpful   Also nasal saline spray helps also   A vaporizer in bedroom can help with congestion from dry air  Zyrtec for runny nose is fine    Watch your temperature if you feel poorly

## 2024-09-19 ENCOUNTER — Other Ambulatory Visit

## 2024-10-03 ENCOUNTER — Ambulatory Visit: Admitting: Physician Assistant
